# Patient Record
Sex: Male | Born: 1970 | Hispanic: Yes | Marital: Single | Smoking: Former smoker
Health system: Western US, Academic
[De-identification: ages and names within clinical notes are randomized; demographics above are authoritative.]

## PROBLEM LIST (undated history)

## (undated) DIAGNOSIS — K219 Gastro-esophageal reflux disease without esophagitis: Secondary | ICD-10-CM

## (undated) DIAGNOSIS — G35 Multiple sclerosis: Secondary | ICD-10-CM

## (undated) DIAGNOSIS — I10 Essential (primary) hypertension: Secondary | ICD-10-CM

## (undated) DIAGNOSIS — Z7409 Other reduced mobility: Secondary | ICD-10-CM

## (undated) HISTORY — PX: EXPLORATORY LAPAROTOMY: AMBSHX0061

## (undated) HISTORY — DX: Essential (primary) hypertension: I10

## (undated) HISTORY — DX: Multiple sclerosis: G35

## (undated) HISTORY — DX: Gastro-esophageal reflux disease without esophagitis: K21.9

## (undated) HISTORY — DX: Other reduced mobility: Z74.09

## (undated) MED FILL — riTUXimab-pvvr 10 mg/mL intravenous solution: INTRAVENOUS | Qty: 100 | Status: AC

## (undated) NOTE — Telephone Encounter (Signed)
Formatting of this note might be different from the original.  Received call on MS nursing line from:    [place]    RE: Pt needs PA for his Ampyra    Noted pt was last seen on 07/08/19.    Routed to Dr. Blenda Peals and pcc team for assistance.   Electronically signed by Burnadette Pop, RN at 02/07/2022 10:48 AM PDT

---

## 2013-04-16 ENCOUNTER — Ambulatory Visit: Admit: 2013-04-16 | Discharge: 2013-04-16 | Disposition: A | Discharge status: Home / Self Care | Payer: Self-pay

## 2013-09-09 ENCOUNTER — Ambulatory Visit: Admit: 2013-09-09 | Discharge: 2013-09-09 | Disposition: A | Discharge status: Home / Self Care | Payer: Self-pay

## 2013-09-16 ENCOUNTER — Ambulatory Visit: Admit: 2013-09-16 | Discharge: 2013-09-16 | Disposition: A | Discharge status: Home / Self Care | Payer: Self-pay

## 2013-12-02 MED ADMIN — incobotulinumtoxinA (XEOMIN) injection 300 Units: 300 [IU] | INTRAMUSCULAR | @ 20:00:00 | NDC 00259161001

## 2015-07-15 ENCOUNTER — Ambulatory Visit: Admit: 2015-07-15 | Discharge: 2015-07-15 | Disposition: A | Discharge status: Home / Self Care | Payer: Self-pay

## 2015-08-14 ENCOUNTER — Ambulatory Visit: Admit: 2015-08-14 | Discharge: 2015-08-14 | Disposition: A | Discharge status: Home / Self Care | Payer: Self-pay

## 2016-07-13 ENCOUNTER — Ambulatory Visit: Admit: 2016-07-13 | Discharge: 2016-07-13 | Disposition: A | Discharge status: Home / Self Care | Payer: Self-pay

## 2016-09-08 NOTE — Telephone Encounter (Signed)
Faxed copy of patient's insurance information to [facility] as requested by Herbert Seta for Ampyra PA. -SD    [facility]  P: [phone] [id number]  F: [phone]

## 2016-09-12 NOTE — Progress Notes (Signed)
Patient has clonus, marked spasticity, and extensor spasms in R leg. Also spasms in Lcalf.     PROCEDURE: Incobotulinum (Botulinum toxin)  A injection. The risks and benefits were discussed with patient and informed consent was obtained. Incobotulinumtoxin A was diluted at 2:1 in a sterile fashion with 0.9% preservative-free normal saline.     After consulting with Dr Joeseph Amor the following muscle groups were treated:     1. R gastrocnemius: 5 injections 25untis each for a total of 125 units.   2. R vastus medialis, rectus femoris: 8 injections x 25 units/each= 200 units   3. L gastrocnemius 3 inj x 25 units = 75 units     Summated, this patient received a total of 400 units of incobotulinumtoxin. There were no complications and no unavoidable waste.

## 2016-09-27 MED ADMIN — riTUXimab (RITUXAN) 500 mg in sodium chloride 0.9 % 500 mL infusion: INTRAVENOUS | @ 17:00:00 | NDC 50242005306

## 2016-09-27 MED ADMIN — diphenhydrAMINE (BENADRYL) injection 50 mg: 50 mg | INTRAVENOUS | @ 16:00:00 | NDC 00641037621

## 2016-09-27 MED ADMIN — methylPREDNISolone sodium succinate (PF) (solu-MEDROL) 125 mg/2 mL injection 125 mg: INTRAVENOUS | @ 16:00:00 | NDC 00009004725

## 2016-09-27 MED ADMIN — acetaminophen (TYLENOL) tablet 650 mg: 650 mg | ORAL | @ 16:00:00 | NDC 50580060002

## 2016-09-27 NOTE — Nursing Note (Signed)
Pt reports to clinic for #1Rituxan infusion. Reports 4/10 chronic pain,  Per pt this is his tolerable level (took his own home meds). C/o ongoing muscle aches, fatigue, and BLE weakness/numbness, as well as RUE weakness/numbeness, all syptoms r/t to MS. Overall pt feels as though his disease is stable at this time.     PIV placed, pre-mediacted as ordered. Infusion titrated @ 68ml/hr, vitals taken per protocol. Tolerated infusion well, no s/s of a reaction, vitals remained stable. Discharged in stable, ambulatory, condition accompanied by friend.     250 ml NS bag used for med flush before, during, and after treatment. Start 0905/Stop: 1310.

## 2016-10-11 MED ADMIN — diphenhydrAMINE (BENADRYL) injection 50 mg: 50 mg | INTRAVENOUS | @ 18:00:00 | NDC 00641037621

## 2016-10-11 MED ADMIN — acetaminophen (TYLENOL) tablet 650 mg: 650 mg | ORAL | @ 18:00:00 | NDC 00904198261

## 2016-10-11 MED ADMIN — riTUXimab (RITUXAN) 500 mg in sodium chloride 500 mL infusion: 500 mg | INTRAVENOUS | @ 19:00:00 | NDC 50242005306

## 2016-10-11 MED ADMIN — methylPREDNISolone sodium succinate (PF) (solu-MEDROL) 125 mg/2 mL injection 125 mg: INTRAVENOUS | @ 18:00:00 | NDC 00009004725

## 2016-10-11 NOTE — Nursing Note (Addendum)
Here for Rituxan infusion, #2. Arrived via wheelchair, accompanied by father. Denies recent fever or infection. Pt verbalized improved right extremity function/ movement since Rituxan was started 3 weeks ago. Able to move hands and leg for the first time in 4 years.    C/o B leg pain 2/10. Took Ibuprofen PTA. Able to transfer from wheelchair to treatment chair with minimal/standby assist.   PIV started on left arm, brisk blood return noted. Pre-meds given as ordered. Started infusion at 100 ml/hr, increased rate by 100 ml/h q 30 min up to max rate of 400 ml/hr. VS monitored with rate changes. No adverse reaction noted throughout the infusion.   Infusion ended, pt tolerated Rituxan.  IV d/c'd.   At 13:45, pt was transferring himself from treatment chair to his wheelchair. RN present during this time. Wheelchair locked and positioned per pt preference in preparation for transfer. While the pt was moving himself to his wheelchair, he landed in the gap/space between the treatment chair and his wheelchair. His weight pushed the wheelchair away. He was assisted to the floor by nursing staff. No injury sustained. He was then assisted back to his wheelchair by staff. Pt and family comfortable to go home. MD notified by NP.  Discharged to home. Advised pt to call the unit should they need assistance in getting into the car.   Instructed pt to call provider for any new or worsening symptoms, such as fever.     .COMPLETE THE DAY CHECKLIST:   The checklist below has been reviewed and implemented:   1. Beacon Treatment Plan, Therapy Plan, Signed and Held Orders   2. Growth Factors Administered no  3. Medication start and end times completed   4. Follow-up appointment scheduled   5. Comments: n/a   After checklist completed, go to the Oncology Navigator, click on Treatment Plan and click COMPLETE DAY.

## 2016-10-12 NOTE — Telephone Encounter (Signed)
Left message for pt to follow up on fall in clinic yesterday.  Gave pt our phone number and asked him to call with any problems or for assistance in contacting his provider if needed.

## 2016-10-12 NOTE — Telephone Encounter (Signed)
Pt experienced fall after rituxan administration on 10/11/16.   Pt returned call previously noted.  States he is feeling very well today with no problems/complaints related to treatment or fall.  Spoke to [employer], Charity fundraiser.

## 2016-11-17 NOTE — Telephone Encounter (Signed)
Sent PA for Ampyra via CoverMyMeds. -SD

## 2016-12-20 NOTE — Progress Notes (Signed)
Patient has clonus, marked spasticity, and extensorspasms in R leg. Also spasms in Lcalf.  Last injection worked well.    PROCEDURE: Incobotulinum (Botulinum toxin) A injection. The risks and benefits were discussed with patient and informed consent was obtained. Incobotulinumtoxin A was diluted at 2:1 in a sterile fashion with 0.9% preservative-free normal saline.     After consulting with Dr Joeseph Amor the following muscle groups were treated:    1. R gastrocnemius: 5 injections 25untis each for a total of 125units.   2. R vastus medialis, rectus femoris: 8 injections x 25 units/each= 200 units   3. L gastrocnemius 3 inj x 25 units = 75units     Summated, this patient received a total of 400units of incobotulinumtoxin. There were no complications and no unavoidable waste.

## 2016-12-20 NOTE — Progress Notes (Signed)
F/U MS     Meds:   Rituximab (last infusion 3 months ago) - We will monitor B-cells                           Ampyra 10 mg bid                           Ibuprofen 800 mg prn  (HA and other pains)                           Norco 5/325 prn (pain)  -  #60 per month - was being filled by PMD                           Vit D    5000 IU / d     Baclofen 20 mg tid  (spasms)                           Biotin  10 mg per day    10 Pt ROS () ;  Bladder:  OK  ;  Bowel:   OK    Brain MRI: Not Done     Labs: 03/28/16:Pre-Rituxan labs Ok     c/o: Pain in R shoulder and R knee ( was being managed with Norco but PMD was fired and he doesn't have person to write script)     Botox works well      No Episodes        No Exam:      Assess: MS - stable    Plan:  Long discussion regarding Rx - will monitor B-cells    I will write 1 script for Norco but then patient to find another PMD    RV 6 months      I spent a total of  25 minutes with this patient, 15 of which was spent in  discussing with the patient the various treatment options, the disease course, and the patient's prognosis.

## 2017-02-09 NOTE — Telephone Encounter (Signed)
Returned patient's phone call and left message informing patient that his next appointment with Dr. Joeseph Amor is on 03/21/17 at 11AM.    Encouraged patient to call back if he has any further questions.

## 2017-02-10 NOTE — Telephone Encounter (Signed)
Patient called and would like to know when he is due for his next Rituxan infusion. Patient was last infused 09/27/16 and 10/11/16. Informed patient that since Dr. Joeseph Amor is treating off of B-cells, patient should check labs at the 82-month mark (in September 2018). Patient verbalized understanding. He will check labs at his next visit with Dr. Joeseph Amor on 03/21/17.

## 2017-03-21 ENCOUNTER — Ambulatory Visit: Admit: 2017-03-21 | Discharge: 2017-03-21 | Payer: MEDICARE | Attending: Neurology

## 2017-03-21 DIAGNOSIS — M62838 Other muscle spasm: Secondary | ICD-10-CM

## 2017-03-21 DIAGNOSIS — G35 Multiple sclerosis: Secondary | ICD-10-CM

## 2017-03-21 NOTE — Addendum Note (Signed)
Addended by: Gae Dry on: 03/21/2017 02:15 PM     Modules accepted: Orders, SmartSet

## 2017-03-21 NOTE — Progress Notes (Signed)
Patient has clonus, marked spasticity, and extensorspasms in R leg. Also spasms in Lcalf.  Last injection worked well but wore off after 2.5 months.    PROCEDURE: Incobotulinum (Botulinum toxin) A injection. The risks and benefits were discussed with patient and informed consent was obtained. Incobotulinumtoxin A was diluted at 2:1 in a sterile fashion with 0.9% preservative-free normal saline.     After consulting with Dr Joeseph Amor the following muscle groups were treated:    1. R gastrocnemius: 5 injections 25untis each for a total of 125units.   2. R vastus medialis, rectus femoris: 8 injections x 25 units/each= 200 units   3. L gastrocnemius 3 inj x 25 units = 75units     Summated, this patient received a total of 400units of incobotulinumtoxin. There were no complications and no unavoidable waste.

## 2017-03-23 NOTE — Telephone Encounter (Signed)
Faxed PA form for Rituxan to [facility]. -SD

## 2017-03-28 NOTE — Telephone Encounter (Signed)
Sent Rituxan pre-lab orders to patient via email and snail mail. -SD

## 2017-06-17 ENCOUNTER — Encounter: Admit: 2017-06-17 | Discharge: 2017-06-19 | Payer: MEDICARE | Attending: Neurology

## 2017-06-17 DIAGNOSIS — G35 Multiple sclerosis: Secondary | ICD-10-CM

## 2017-06-17 MED ORDER — ACETAMINOPHEN 325 MG TABLET
325 | Freq: Once | ORAL | Status: AC
Start: 2017-06-17 — End: 2017-06-17
  Administered 2017-06-17: 19:00:00 via ORAL

## 2017-06-17 MED ORDER — EPINEPHRINE 0.3 MG/0.3 ML INJECTION, AUTO-INJECTOR
0.3 | Freq: Once | INTRAMUSCULAR | Status: DC | PRN
Start: 2017-06-17 — End: 2017-06-19

## 2017-06-17 MED ORDER — HYDROCORTISONE SOD SUCCINATE (PF) 100 MG/2 ML SOLUTION FOR INJECTION
100 | Freq: Once | INTRAMUSCULAR | Status: DC | PRN
Start: 2017-06-17 — End: 2017-06-19

## 2017-06-17 MED ORDER — DIPHENHYDRAMINE 50 MG/ML INJECTION SOLUTION
50 | Freq: Once | INTRAMUSCULAR | Status: DC | PRN
Start: 2017-06-17 — End: 2017-06-19

## 2017-06-17 MED ORDER — DIPHENHYDRAMINE 50 MG/ML INJECTION SOLUTION
50 | Freq: Once | INTRAMUSCULAR | Status: AC
Start: 2017-06-17 — End: 2017-06-17
  Administered 2017-06-17: 19:00:00 via INTRAVENOUS

## 2017-06-17 MED ORDER — ALBUTEROL SULFATE HFA 90 MCG/ACTUATION AEROSOL INHALER
90 | Freq: Once | RESPIRATORY_TRACT | Status: DC | PRN
Start: 2017-06-17 — End: 2017-06-19

## 2017-06-17 MED ORDER — METHYLPREDNISOLONE SOD SUCC (PF) 125 MG/2 ML SOLUTION FOR INJECTION
125 | Freq: Once | INTRAMUSCULAR | Status: AC
Start: 2017-06-17 — End: 2017-06-17
  Administered 2017-06-17: 19:00:00 via INTRAVENOUS

## 2017-06-17 MED ORDER — RITUXIMAB 10 MG/ML CONCENTRATE,INTRAVENOUS
10 | INTRAVENOUS | Status: DC
Start: 2017-06-17 — End: 2017-06-19
  Administered 2017-06-17: 20:00:00 via INTRAVENOUS

## 2017-06-17 NOTE — Nursing Note (Addendum)
Alex Mann, [place] with hx of MS is here for Rituximab. Arrived here in the wheelchair accompanied by his caregiver. He reports he has more weakness in bilateral legs (R>L). Had a fall at home a month ago, landed on his back. No injury reported. Has chronic pain in bilateral knees and thighs rated at 5/10, uses ibuprofen 2-3 times/day with some relief. Endorses more spasticities 2-3/days. Elevated BP noted. Pt reports that he sopped taking HTN meds due to side effects.     PIV inserted to left arm with brisk blood return noted. Premedications (solumedrol, benadryl IV and tylenol PO) given followed by rituximab per order.     # fall: pt slid off the chair due to spasticity. RN found him on the floor on his stomach. Rituxan stopped. No injuries noted. BP 155/99, P 90. Denies headache, dizziness or confusion. Rituxan re-started at the rate at 400 mL/hr and completed without any complications. Stable V.S.     Discharged to the home accompanied by his caregiver in the wheelchair. The fall reported to Dr Joeseph Amor via email.

## 2017-06-18 NOTE — Telephone Encounter (Signed)
Follow-up call placed to patient. Pt fell out of chair yesterday while receiving Rituxan. Reports he had leg spacticity and fell asleep after Benadryl. Able to get off floor with assistance and completed medication.   Denies any new issues today. Pt is at home resting and appreciated call.

## 2017-07-13 ENCOUNTER — Ambulatory Visit: Admit: 2017-07-13 | Discharge: 2017-07-13 | Payer: MEDICARE | Attending: Neurology

## 2017-07-13 DIAGNOSIS — G35 Multiple sclerosis: Secondary | ICD-10-CM

## 2017-07-13 DIAGNOSIS — R252 Cramp and spasm: Secondary | ICD-10-CM

## 2017-07-13 MED ORDER — INCOBOTULINUMTOXINA 100 UNIT INTRAMUSCULAR SOLUTION
100 | Freq: Once | INTRAMUSCULAR | Status: AC
Start: 2017-07-13 — End: 2017-07-13
  Administered 2017-07-13: 22:00:00 via INTRAMUSCULAR

## 2017-07-13 MED ORDER — DALFAMPRIDINE ER 10 MG TABLET,EXTENDED RELEASE,12 HR
10 | ORAL | 1 refills | Status: DC
Start: 2017-07-13 — End: 2017-11-27

## 2017-07-13 NOTE — Progress Notes (Addendum)
Patient with MS and spasticity    Last injection worked well but wore off after 2.5 months.    PROCEDURE: Incobotulinum (Botulinum toxin) A injection. The risks and benefits were discussed with patient and informed consent was obtained. Incobotulinumtoxin A was diluted at 2:1 in a sterile fashion with 0.9% preservative-free normal saline.     After consulting with Dr Joeseph Amor the following muscle groups were treated:    1. R gastrocnemius: 5 injections 25untis each for a total of 125units.   2. R vastus medialis, rectus femoris: 8 injections x 25 units/each= 200 units   3. L gastrocnemius 3 inj x 25 units = 75units     Summated, this patient received a total of 400units of incobotulinumtoxin. There were no complications and no unavoidable waste.

## 2017-08-28 MED ORDER — DALFAMPRIDINE ER 10 MG TABLET,EXTENDED RELEASE,12 HR
10 | ORAL | 3 refills | Status: DC
Start: 2017-08-28 — End: 2017-11-27

## 2017-09-19 ENCOUNTER — Ambulatory Visit: Admit: 2017-09-19 | Discharge: 2017-09-19 | Payer: MEDICARE | Attending: Neurology

## 2017-09-19 DIAGNOSIS — R252 Cramp and spasm: Secondary | ICD-10-CM

## 2017-09-19 DIAGNOSIS — G35 Multiple sclerosis: Secondary | ICD-10-CM

## 2017-09-19 DIAGNOSIS — M62831 Muscle spasm of calf: Secondary | ICD-10-CM

## 2017-09-19 DIAGNOSIS — G9589 Other specified diseases of spinal cord: Secondary | ICD-10-CM

## 2017-09-19 DIAGNOSIS — M62838 Other muscle spasm: Secondary | ICD-10-CM

## 2017-09-19 DIAGNOSIS — M6248 Contracture of muscle, other site: Secondary | ICD-10-CM

## 2017-09-19 MED ORDER — INCOBOTULINUMTOXINA 100 UNIT INTRAMUSCULAR SOLUTION
100 | Freq: Once | INTRAMUSCULAR | Status: AC
Start: 2017-09-19 — End: 2017-09-19
  Administered 2017-09-19: 23:00:00 via INTRAMUSCULAR

## 2017-09-19 NOTE — Progress Notes (Signed)
Last injection worked well but wore off after 2.5 months.    PROCEDURE: Incobotulinum (Botulinum toxin) A injection. The risks and benefits were discussed with patient and informed consent was obtained. Incobotulinumtoxin A was diluted at 2:1 in a sterile fashion with 0.9% preservative-free normal saline.  Utilized universal procedure for time-out. Utilized universal procedure for time-out       After consulting with Dr Joeseph Amor the following muscle groups were treated:    1. R gastrocnemius: 5 injections 25untis each for a total of 125units.   2. R vastus medialis, rectus femoris: 8 injections x 25 units/each= 200 units   3. L gastrocnemius 3 inj x 25 units = 75units     Summated, this patient received a total of 400units of incobotulinumtoxin. There were no complications and no unavoidable waste.

## 2017-09-19 NOTE — Progress Notes (Signed)
F/U MS     Meds:    Rituximab (last infusion December 2018) - We will monitor B-cells              Ampyra 10 mg bid              Ibuprofen 800 mg prn (HA and other pains)              Norco 5/325 prn (pain)  -  #60 per month - was being filled by PMD              Vit D5000 IU / d                                      Baclofen 20 mg tid  (spasms)              Biotin 10 mg per day    10 Pt ROS () ;  Bladder:  OK  ;  Bowel:   OK    Brain MRI: Not Done (last 12/17)    Labs: Pre-Rituximab labs Ok     c/o: Doing Ok     Still with pain in R knee and now R hip after falling on R hip 2 months ago.     Now able to shake my hand with his R hand ; can now move his R leg (improvement)     Can't walk ; does assist with transfers can stand     No episodes      Exam:    MS:  A+O x3  ;  Cog Fx:    nl   CrN II-XII: VA: 20/10  OD ;  20/10  OS           () INO ;   () MG ; () Nyst    H / FS / FM / T / Palate / SCM : nl     Motor:    Tone:     UE  nl ; LE:  nl     D T B WE FA IP Q ADD ABD H TA   R 4 4  4  0* 0  0 0  0   L 5+ 5+  5+ 5+ 4  4+ 4  3     * can't extend fingers on R    Sens:  LT: nl   Cbl:  FTN: Ok L; unable R ; HKS: Unable bilat ; RAM: Ok L ;  R  c/w weakness    Gait:  Unable    DTRs  UE: tr(=)  LE: tr (=)       Assess: MS - stable    Plan:  Continue present Rx    RV 6 months      I spent a total of  40 minutes with this patient, 25 of which was spent in  discussing with the patient the various treatment options, the disease course, and the patient's prognosis.

## 2017-10-19 NOTE — Telephone Encounter (Signed)
Sent Rituxan pre-lab orders to patient via email and snail mail. -SD

## 2017-10-30 NOTE — Telephone Encounter (Signed)
Called and left a voice message to patient following up his Rituxan pre-labs. -SD

## 2017-11-16 NOTE — Telephone Encounter (Signed)
Mailed pre-test lab order for retreat Rituxan to patient.

## 2017-11-27 MED ORDER — DALFAMPRIDINE ER 10 MG TABLET,EXTENDED RELEASE,12 HR
10 | ORAL_TABLET | ORAL | 3 refills | 90.00000 days | Status: DC
Start: 2017-11-27 — End: 2017-12-01

## 2017-12-01 MED ORDER — DALFAMPRIDINE ER 10 MG TABLET,EXTENDED RELEASE,12 HR: 10 mg | ORAL | 3 refills | Status: DC

## 2017-12-01 NOTE — Telephone Encounter (Signed)
Left message to patient to confirm if he did his lab work or if he wants [place] to re send the lab orders.

## 2017-12-01 NOTE — Progress Notes (Signed)
Rerouted to contracted specialty pharmacy

## 2017-12-01 NOTE — Telephone Encounter (Signed)
Voicemail: Calling to check the status of his bloodwork and Rituxan. Routed to [place].

## 2017-12-07 NOTE — Telephone Encounter (Signed)
Called pt to notify that Dr. Joeseph Amor recommended to repeat pt's blood work in a month from last blood work done d/t low B cells. Pt verbalized understanding and agreed. Notified pt his last labs were done on  11/13/17. Will notify Hale Bogus, Nurse, adult.

## 2017-12-22 NOTE — Telephone Encounter (Signed)
Voicemail: Patient would like to know the status of the bloodwork and scheduling of his Rituxan. Routed to [place].

## 2017-12-25 NOTE — Telephone Encounter (Signed)
Patient left message following up regarding his labwork. Called and left message to send [place] via fax the most recent lab results.

## 2017-12-29 NOTE — Telephone Encounter (Signed)
LVM regarding labs that needed to get re done. And asked for a call back

## 2017-12-29 NOTE — Telephone Encounter (Signed)
Patient called back stating that labs were sent out to his PCP and will follow up with the lab to have results faxed to the office.

## 2018-01-08 NOTE — Telephone Encounter (Signed)
Called pt to follow up and gather more information. Unable to get hold. LVM requesting to contact back.

## 2018-01-08 NOTE — Telephone Encounter (Signed)
LVM to have pt call the nurse since he is having symptoms.

## 2018-01-15 NOTE — Telephone Encounter (Signed)
Faxed urgent PA for Rituxan to [employer] [facility] [place].

## 2018-01-15 NOTE — Telephone Encounter (Signed)
Contacted [facility] to obtain Lymphocytes subsets and CBC lab results.  Per [facility], they will obtain authorization from [facility] to provide lab results to [place].

## 2018-02-16 ENCOUNTER — Encounter: Admit: 2018-02-16 | Discharge: 2018-02-18 | Payer: MEDICARE | Attending: Neurology

## 2018-02-16 DIAGNOSIS — G35 Multiple sclerosis: Secondary | ICD-10-CM

## 2018-02-16 MED ORDER — ACETAMINOPHEN 325 MG TABLET
325 | Freq: Once | ORAL | Status: AC
Start: 2018-02-16 — End: 2018-02-16
  Administered 2018-02-16: 17:00:00 via ORAL

## 2018-02-16 MED ORDER — METHYLPREDNISOLONE SOD SUCC (PF) 125 MG/2 ML SOLUTION FOR INJECTION
125 | Freq: Once | INTRAMUSCULAR | Status: AC
Start: 2018-02-16 — End: 2018-02-16
  Administered 2018-02-16: 18:00:00 via INTRAVENOUS

## 2018-02-16 MED ORDER — DIPHENHYDRAMINE 50 MG/ML INJECTION SOLUTION
50 | Freq: Once | INTRAMUSCULAR | Status: AC
Start: 2018-02-16 — End: 2018-02-16
  Administered 2018-02-16: 17:00:00 via INTRAVENOUS

## 2018-02-16 MED ORDER — HYDROCORTISONE SOD SUCCINATE (PF) 100 MG/2 ML SOLUTION FOR INJECTION
100 | Freq: Once | INTRAMUSCULAR | Status: DC | PRN
Start: 2018-02-16 — End: 2018-02-18

## 2018-02-16 MED ORDER — ALBUTEROL SULFATE HFA 90 MCG/ACTUATION AEROSOL INHALER
90 | Freq: Once | RESPIRATORY_TRACT | Status: DC | PRN
Start: 2018-02-16 — End: 2018-02-18

## 2018-02-16 MED ORDER — DIPHENHYDRAMINE 50 MG/ML INJECTION SOLUTION
50 | Freq: Once | INTRAMUSCULAR | Status: DC | PRN
Start: 2018-02-16 — End: 2018-02-18

## 2018-02-16 MED ORDER — EPINEPHRINE 0.3 MG/0.3 ML INJECTION, AUTO-INJECTOR
0.3 | Freq: Once | INTRAMUSCULAR | Status: DC | PRN
Start: 2018-02-16 — End: 2018-02-18

## 2018-02-16 MED ORDER — SODIUM CHLORIDE 0.9 % INTRAVENOUS SOLUTION
0.9 | INTRAVENOUS | Status: DC
Start: 2018-02-16 — End: 2018-02-18
  Administered 2018-02-16: 18:00:00 1000 mg via INTRAVENOUS

## 2018-02-16 NOTE — Nursing Note (Signed)
Pt presents for rituxan.  Pt is falls risk (hx of slipping out of infusion chairs) and requires bed.  He is non weight bearing and require [employer] lift equipment.  Notes pain to bilateral knees, takes ibuprofen with positive effect.  He also notes multiple spasms to extremities, states is at his baseline.  No further c/o s/s.  Pre meds given.  Due to spasm, PIV accidentally removed by patient.  PIV restarted without issue and medication resumed.  Rituxan titrated and VS monitored per orders, pt tolerated without issue.  Discharged in stable condition without incident.    .COMPLETE THE DAY CHECKLIST:   The checklist below has been reviewed and implemented:   1. Beacon Treatment Plan, Therapy Plan, Signed and Held Orders   2. Growth Factors Administered no  3. Medication start and end times completed   4. Follow-up appointment scheduled no, due in 6 months, pt will call to schedule  5. Comments: n/a  After checklist completed, go to the Oncology Navigator, click on Treatment Plan and click COMPLETE DAY.

## 2018-04-26 ENCOUNTER — Ambulatory Visit: Admit: 2018-04-26 | Discharge: 2018-04-26 | Payer: MEDICARE | Attending: Neurology

## 2018-04-26 DIAGNOSIS — G35 Multiple sclerosis: Secondary | ICD-10-CM

## 2018-04-26 DIAGNOSIS — M62838 Other muscle spasm: Secondary | ICD-10-CM

## 2018-04-26 DIAGNOSIS — R52 Pain, unspecified: Secondary | ICD-10-CM

## 2018-04-26 DIAGNOSIS — M6249 Contracture of muscle, multiple sites: Secondary | ICD-10-CM

## 2018-04-26 DIAGNOSIS — R3915 Urgency of urination: Secondary | ICD-10-CM

## 2018-04-26 DIAGNOSIS — R252 Cramp and spasm: Secondary | ICD-10-CM

## 2018-04-26 DIAGNOSIS — R152 Fecal urgency: Secondary | ICD-10-CM

## 2018-04-26 MED ORDER — OXYBUTYNIN CHLORIDE ER 5 MG TABLET,EXTENDED RELEASE 24 HR
5 | ORAL_TABLET | Freq: Every day | ORAL | 3 refills | Status: AC
Start: 2018-04-26 — End: ?

## 2018-04-26 MED ORDER — INCOBOTULINUMTOXINA 100 UNIT INTRAMUSCULAR SOLUTION
100 | Freq: Once | INTRAMUSCULAR | Status: AC
Start: 2018-04-26 — End: 2018-04-26
  Administered 2018-04-26: 20:00:00 via INTRAMUSCULAR

## 2018-04-26 NOTE — Addendum Note (Signed)
Addended by: Gae Dry on: 06/28/2018 12:43 PM     Modules accepted: Orders

## 2018-04-26 NOTE — Progress Notes (Addendum)
Patient with MS and spasticity    Last injection worked well but wore off after 2.5 months. But now with severe spasticity has developed in both LE including adductors    PROCEDURE: Incobotulinum (Botulinum toxin) A injection. The risks and benefits were discussed with patient and informed consent was obtained. Incobotulinumtoxin A was diluted at 2:1 in a sterile fashion with 0.9% preservative-free normal saline.  Utilized universal procedure for time-out. Utilized universal procedure for time-out       After consulting with Dr Joeseph Amor the following muscle groups were treated:    1. R gastrocnemius: 2 injections 25untis each for a total of 50units.   2. L  vastus medialis, rectus femoris: 8 injections x 25 units/each= 150 units   3. R vastus medialis, rectus femoris: 8 injections x 25 units/each= 150 units   4. L gastrocnemius 2 inj x 25 units = 50units   5. R adductor magnus 4 injections 25 units for a total =100 units  6. L adductor magnus 4 injections 25 units for a total =100 units      Summated, this patient received a total of 600units of incobotulinumtoxin. There were no complications and no unavoidable waste.

## 2018-04-26 NOTE — Progress Notes (Signed)
F/U MS     Meds:    Rituximab (last infusion August 2019) - q 6 months  Ampyra 10 mg bid  Ibuprofen 800 mg prn (HA and other pains)   Vit D5000 IU / d  Baclofen 20 mg tid (spasms)  Biotin 10 mg per day (not tal=king --     10 Pt ROS () ;  Bladder:   +urgency; daily incont (new)        Bowel:    +urgency    Brain MRI: Not Done (last Dec 2017)    Labs: Not done;  Apparently UA showed no infection     c/o: Fell in July when transferring; attendant wouldn't come to his assistance and catch patient.   Fell on but but felt a "snap" in R knee and since then he has had severe pain in R hip and knee   Also has been unable to straighten either leg (R>L) but no pain on L.      Hip pain has been present for ~2 years; getting worse; much worse after fall     Recently had an MRI (no MRI report);  Hip MRI needs  to be scheduled     No episodes     No walking      Exam:    MS:  A+O x3  ;  Cog Fx:    nl   CrN II-XII: VA: 20/10  OD ;  20/10  OS           () INO ;   () MG ; () Nyst    H / FS / FM / T / Palate / SCM : nl     Motor:    Tone:     UE  nl on L; sever spasticity on R;         LE: severe spasticity bilat          (I can't move legs apart or extend knees.)    D T B WE FA IP Q ADD ABD H TA   R 4- 5+  5- 3 0  * * * *   L 5+ 5+  5+ 5+ 2-3  * * * *     * = marked spasticity interferes with testing    Sens:  LT: nl     Cbl:  FTN: nl  ; HKS: nl  ; RAM: nl     Gait:  Base: nl ; H / T / T:     nl  ;        () [place] ; Pos: nl       DTRs  UE: 1+ (L);   tr (R LE: tr (=)       Assess: Spasticity much worse as is urinary function --  Checked for UTI but neg    Plan:  Will change to q 6 months Rituximab    Will need to get MRI report and determine what needs to be done    Trial Ditropan    MRI of R hip      I spent a total of  40 minutes with this patient, 25  of which was spent in  discussing with the patient the various treatment options, the disease course, and the patient's prognosis.

## 2018-05-22 NOTE — Telephone Encounter (Signed)
Patient's father would like to speak with Dr. Joeseph Amor regarding the patient's medical condition. Told the father that we need ROI sign by the patient to release information to him. Patient's father stated that he can't provide that. Educated him the importance of [employer] compliance. Requested to callback if he wants to obtain ROI.

## 2018-06-28 MED ORDER — INCOBOTULINUMTOXINA 100 UNIT INTRAMUSCULAR SOLUTION
100 | Freq: Once | INTRAMUSCULAR | Status: AC
Start: 2018-06-28 — End: ?

## 2018-07-20 ENCOUNTER — Ambulatory Visit: Admit: 2018-07-20 | Discharge: 2018-07-20 | Payer: MEDICARE | Attending: Neurology

## 2018-07-20 DIAGNOSIS — G35 Multiple sclerosis: Secondary | ICD-10-CM

## 2018-07-20 NOTE — Telephone Encounter (Signed)
Called pt to f/u and obtain more information. Pt c/o unable to ambulate himself in and out of bed or use the bathroom independently. Reported he took prednisone prescribed by Dr. Tomasa Blase from [facility]. Pt reported UTI was r/o and pt was put on ABT. Reported completing his ABT for UTI a week ago. Pt verbalized concern and stated, "this is not my baseline, I was able to get in and out of bed myself before". Called Dr. Joeseph Amor.     Dr. Joeseph Amor recommended to schedule a telephone for pt. Pt aware and requested Hale Bogus to assist with scheduling.

## 2018-07-20 NOTE — Progress Notes (Signed)
Telephone Call      Reason for the call: Having considerably more difficulty wiuth ADL in the setting of a UTI. Finished course of antibiotics but  Not yet improved.      Recommendation: Will Rx with another course. UA pending

## 2018-07-20 NOTE — Telephone Encounter (Signed)
Patient left message stating that he is having exacerbation.     Routed to [place] to assist.

## 2018-08-01 NOTE — ED Notes (Signed)
Attempted conf call w/ Dr. Nelta Numbers (NR) and OSH ED MD but OSH TC coordinator Brett Canales states pt d/t home. Transfer request cancelled.

## 2018-08-28 NOTE — Telephone Encounter (Signed)
2/11 mailed labs to patient  Referral created

## 2018-09-14 NOTE — Telephone Encounter (Signed)
2/28 Labs done.    Labs and yellow form in Dr. Vaughan Browner folder to review and sign.

## 2018-09-24 NOTE — Telephone Encounter (Signed)
3/9 Called patient to schedule his rituxan infusion

## 2018-11-15 MED ORDER — DALFAMPRIDINE ER 10 MG TABLET,EXTENDED RELEASE,12 HR
10 | ORAL | 3 refills | 90.00000 days | Status: AC
Start: 2018-11-15 — End: 2019-11-07

## 2018-11-27 ENCOUNTER — Ambulatory Visit: Admit: 2018-11-27 | Discharge: 2018-11-27 | Payer: MEDICARE | Attending: Neurology

## 2018-11-27 DIAGNOSIS — G35 Multiple sclerosis: Secondary | ICD-10-CM

## 2018-11-27 NOTE — Progress Notes (Addendum)
F/U MS    I performed this consultation using real-time Telehealth tools, including a live video connection between my location and the patient's location. Prior to initiating the consultation, I obtained informed verbal consent to perform this consultation using Telehealth tools and answered all the questions about the Telehealth interaction.       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

## 2019-01-04 MED ORDER — RITUXIMAB 10 MG/ML CONCENTRATE,INTRAVENOUS
10 mg/mL | INTRAVENOUS | Status: AC
Start: 2019-01-04 — End: ?

## 2019-06-20 NOTE — Telephone Encounter (Signed)
Pt called and c/o unable to walk. Pt reported his knees gets locked together 90 degree and is unable to unlock. Per pt, he took Baclofen but failed and also tried low dose of Klonopin which was effective initially but no more. Pt reported his local physician is recommending ligament surgery but he would like to speak with Dr. Blenda Peals prior to proceeding. Pt verbalized concern and stated, "I am not borderline bedridden".     When questioned, reported it's ongoing for a year or two and is worsening now. Pt denied see a Physical Therapist currently. Notified Dr. Blenda Peals.     Dr. Blenda Peals recommended in-person visit if possible for evaluation and assesment. Notified pt MD's recommendation and notified I will request [facility] to assist. Pt agreed.

## 2019-06-21 NOTE — Telephone Encounter (Signed)
Received a voicemail from patient on Friday, June 21, 2019 12:00 PM, requesting to speak with sheila.    Routed to sheila to further assist.

## 2019-07-08 ENCOUNTER — Ambulatory Visit: Admit: 2019-07-08 | Payer: MEDICARE | Attending: Neurology | Primary: Physician

## 2019-07-08 DIAGNOSIS — G35 Multiple sclerosis: Secondary | ICD-10-CM

## 2019-07-08 MED ORDER — CLONAZEPAM 0.5 MG TABLET: 0.5 mg | ORAL | Status: AC | PRN

## 2019-07-08 MED ORDER — DIAZEPAM 5 MG TABLET
5 | ORAL_TABLET | ORAL | 0 refills | Status: DC
Start: 2019-07-08 — End: 2019-07-17

## 2019-07-08 NOTE — Progress Notes (Signed)
F/U MS     Meds:    Rituximab (last infusion August 2019) -q 6 months  Ampyra 10 mg bid  Ibuprofen 800 mg prn (HA and other pains)  Vit D5000 IU / d  Baclofen 20 mg tid (spasms)  Biotin 10 mg per day     Has script for ditropan but pt not taking -- told him no to at this time    10 Pt ROS () ; Bladder:+urgency; < 1 per week ncont (at night)-- much improved  Bowel:+urgency    Brain MRI: Not Done  (last Dec 2017)    Labs: Not done     c/o: Adductor and Knee flexor spasticity for the past year -- getting markedly worse recently.   Getting pressure sores   Baclofen doesn't help        Also has scoliosis which has been getting worse      Exam:  I am unable to move his LE which are locked in adduction, + hip/knee  Flexion + ankle dorsiflexion        Assess: Severe spasticity in LE;  Unclear whether there is a component of contracture    Plan:  Urgernt referral to Neurosurgery for Baclofen pump consideration    Script for Valium for ride back to [facility]        I spent a total of  minutes in face -to-face time with the patient and in non-face-to-face activities conducted today 07/08/19 directly related to this video visit, including reviewing records and tests, obtaining history and exam, placing orders, communicating with other healthcare professionals, counseling the patient, family or caregiver, documenting in the medical record, and/or care coordination for the diagnoses above.

## 2019-07-09 NOTE — Telephone Encounter (Signed)
Called and LVM for patient to call me back to schedule an appointment

## 2019-07-16 NOTE — Telephone Encounter (Signed)
Alex Mann, [facility] forward a voice message from pt requesting for prescription. VM was unclear. Called pt to f/u.    Pt reported his caregiver called earlier and he does not recall the meds that they were calling for. Pt did report he note valium being effective for his bilateral lower extremities when locked. Per pt, he was taking baclofen 80 mg PO per day for two years but not effective and reported he stopped taking it about a month ago. Also reported the baclofen pump MD recommended cannot be done until Feb, 2020. Pt questioned if she can receive Valium prescription to ease the symptom until baclofen pump has been provided. Will notify MD and will f/u. Pt aware.

## 2019-07-17 MED ORDER — DIAZEPAM 5 MG TABLET
5 | ORAL_TABLET | ORAL | 5 refills | Status: AC
Start: 2019-07-17 — End: ?

## 2019-07-17 NOTE — Telephone Encounter (Signed)
Dr. Blenda Peals approved pt's Valium prescription. Called pt to notify. Unable to get hold. LVM with MD's recommendation and requested to contact back if needed.

## 2019-08-15 NOTE — Telephone Encounter (Signed)
Called patient and left voicemail.

## 2019-08-19 NOTE — H&P (Deleted)
Thank you for referring Alex Mann to the [facility] for evaluation of baclofen pump consult.    I performed this consultation using real-time Telehealth tools, including a live video connection between my location and the patient's location. Prior to initiating the consultation, I obtained informed verbal consent to perform this consultation using Telehealth tools and answered all the questions about the Telehealth interaction.  My location {Location:[address]}.      {Failed Video Visit Note (optional):[phone]::" "}    HISTORY OF PRESENT ILLNESS:  Alex Mann is a 49 y.o. male with MS with severe lower extremity spasticity. He presents today for a baclofen pump consult.       Patient Active Problem List   Diagnosis    Multiple sclerosis (CMS code)       Past Medical History:   Diagnosis Date    Diplopia     Spasticity     Increasing       No past surgical history on file.    Allergies/Contraindications   Allergen Reactions    Carbamazepine Rash       Current Outpatient Medications   Medication Sig Dispense Refill    baclofen (LIORESAL) 20 mg tablet Take one tablet po TID for spasticity. (Patient not taking: Reported on 07/08/2019  ) 90 tablet 11    biotin 10,000 mcg CAP Take 1 capsule by mouth Daily.      cholecalciferol, vitamin D3, 10,000 unit CAP Take 6,000 Units by mouth Daily.       clonazePAM (KLONOPIN) 0.5 mg tablet Take 0.5 mg by mouth 2 (two) times daily as needed for Anxiety      dalfampridine 10 mg 12 hr tablet TAKE ONE TABLET BY MOUTH TWICE DAILY (APPROXIMATELY 12 HOURS APART). MAY BE TAKEN WITH OR WITHOUT FOOD. DO NOT CUT OR CRUSH. 180 tablet 3    diazePAM (VALIUM) 5 mg tablet Take 1 tablet PO BID PRN for lower ext spasm and anxiety 60 tablet 5    diptheria-tetanus-acellular pertussis, Tdap, (ADACEL) 2 Lf-(2.5-5-3-5 mcg)-5Lf/0.5 mL injection Inject 0.5 mLs into the muscle once. On 09/29/2014      DOCUSATE SODIUM (COLACE ORAL) Take by mouth.      flu vacc  ts 2013, 18-35yrs,-PF 27 mcg/0.1 mL SYRINGE Inject into the skin. On 09/29/2014      furosemide (LASIX) 20 mg tablet Take 20 mg by mouth Daily.      ibuprofen (ADVIL,MOTRIN) 800 mg tablet Take 1 tablet (800 mg total) by mouth 2 (two) times daily. 60 tablet 5    incobotulinumtoxinA (XEOMIN) 100 unit SOLR injection Inject 400 Units into the muscle once. 400 Units 0    magnesium 250 mg TAB Take 500 mg by mouth nightly at bedtime.       oxybutynin (DITROPAN XL) 5 mg 24 hr tablet Take 1 tablet (5 mg total) by mouth Daily. (Patient not taking: Reported on 07/08/2019  ) 30 tablet 3    riTUXimab (RITUXAN) 10 mg/mL injection Inject 1,000 mg into the vein every 6 (six) months      sildenafil (VIAGRA) 100 mg tablet TAKE 1 TABLET BY MOUTH 30 MINUTES PRIOR TO ACTIVITY 10 tablet 6     Current Facility-Administered Medications   Medication Dose Route Frequency Provider Last Rate Last Admin    incobotulinumtoxinA (XEOMIN) injection 600 Units  600 Units Intramuscular Once Douglas S. Goodin, MD           There are no discontinued medications.    Social History  Socioeconomic History    Marital status: Single     Spouse name: Not on file    Number of children: Not on file    Years of education: Not on file    Highest education level: Not on file   Occupational History    Not on file   Social Needs    Financial resource strain: Not on file    Food insecurity     Worry: Not on file     Inability: Not on file    Transportation needs     Medical: Not on file     Non-medical: Not on file   Tobacco Use    Smoking status: Former Smoker    Smokeless tobacco: Never Used    Tobacco comment: Non Smoker   Substance and Sexual Activity    Alcohol use: Yes     Comment: Occasional alcohol    Drug use: Yes     Types: Marijuana     Comment: occasional    Sexual activity: Not on file   Lifestyle    Physical activity     Days per week: Not on file     Minutes per session: Not on file    Stress: Not on file   Relationships     Social connections     Talks on phone: Not on file     Gets together: Not on file     Attends religious service: Not on file     Active member of club or organization: Not on file     Attends meetings of clubs or organizations: Not on file     Relationship status: Not on file    Intimate partner violence     Fear of current or ex partner: Not on file     Emotionally abused: Not on file     Physically abused: Not on file     Forced sexual activity: Not on file   Other Topics Concern    Not on file   Social History Narrative    Not on file       Family History   Problem Relation Name Age of Onset    Lupus Sister ##Sister1        ROS     Pain Diagram    Physical Exam    Studies:   ***    DIAGNOSIS:   Multiple sclerosis (CMS code)    ASSESSMENT AND PLAN:   Alex Mann is a 49 y.o. male with MS with severe lower extremity spasticity. He presents today for a baclofen pump consult.     I, Tawanna Sat am acting as a Education administrator for services provided by Quintella Baton, MD on 08/19/2019 8:11 AM  ***

## 2019-11-08 MED ORDER — DALFAMPRIDINE ER 10 MG TABLET,EXTENDED RELEASE,12 HR
10 | ORAL | 1 refills | 90.00000 days | Status: DC
Start: 2019-11-08 — End: 2020-11-10

## 2020-01-15 NOTE — Telephone Encounter (Signed)
Called and left a voice message to rescheduled missed appointment in June.  Sent an email to patient as well.  -------------------------------------  From: Serafina Mitchell   Sent: Wednesday, January 15, 2020 3:07 PM  To: [email or web address]  Subject: Follow up appointment with Dr. Dorna Leitz Trish Fountain,    I hope all is well.    Reaching out to help reschedule your missed appointment with Dr. Joeseph Amor.  Below are his next available dates.  Please let me know which date and time will work for you.  Date and time listed are for video (Zoom) visit.  Please let me know if an in-person appointment is preferred.    July 6  8:30 AM, 9:00 AM  July 8  8:30 AM, 9:00 AM, 10:30 AM, 11:00 AM  July 13  8:30 AM, 9:00 AM, 10:30 AM, 11:00 AM    In an effort to protect your privacy and to offer secure communication with the MS clinic at [facility], we will be primarily using MyChart to communicate with patients. We will not be actively monitoring this email account. If you send messages using this email account, you may not receive a timely response.  We encourage you to use MyChart for any non-urgent clinical questions or concerns.     Please use the link below to log into MyChart or if you need to sign up for a new account.  Or, [employer] Customer Service at [phone]. They are available 24 hours a day, seven days a week.    [email or web address]     Best regards,     Bryson Ha  Patient Care Coordinator    [facility]  Department of Neurology  [facility]  Phone: [phone]  Fax: [phone]  Email: [email or web address]

## 2020-02-09 HISTORY — PX: EXPLORATORY LAPAROTOMY: AMBSHX0061

## 2020-03-19 NOTE — Telephone Encounter (Signed)
Patient has called a ACD line inform me that he feels as though he is have a relapse. Per patient, he stated that about a month ago, he went to the ER due to losing consciousness and losing his pulse. He stated that he had to under go surgery due to his "intestines were tangled" he stated that 3 feet of his intestine were remove because it was no longer viable. He did inform me that he went septic after the surgery. He is currently still recovering at [facility]. He also stated that his legs are contracting and mentioned that his left leg is bent and "stuck to this buttock' and feels as though his right leg will be doing the same soon.     Confirmed that the number on file is a good call back number

## 2020-03-19 NOTE — Telephone Encounter (Signed)
Encounter created in error

## 2020-03-24 NOTE — Telephone Encounter (Signed)
Pt called and LVM on nursing line requesting for a call back. Reported he missed a call. Noted Lisette, [facility] called pt. Will notify.

## 2020-03-24 NOTE — Telephone Encounter (Signed)
Lvm for pt to help sch appt

## 2020-03-26 ENCOUNTER — Ambulatory Visit: Admit: 2020-03-26 | Payer: MEDICARE | Attending: Neurology | Primary: Physician

## 2020-03-26 DIAGNOSIS — G35 Multiple sclerosis: Secondary | ICD-10-CM

## 2020-03-26 NOTE — Progress Notes (Signed)
F/U MS     Meds:    Rituximab (last infusion August 2019) -q 6 months  Ampyra 10 mg bid  Ibuprofen 800 mg prn (HA and other pains)  Vit D5000 IU / d  Baclofen 20 mg tid (spasms)  Biotin 10 mg per day      10 Pt ROS () ;  Bladder:+urgency;< 1 per weekincont (at night)-- much improved  Bowel:+urgency    Brain MRI: Not Done (last    )    Labs: Done in hospital     c/o:     Patient called a ACD line last week -- he felt as though he is having a relapse. Per patient, he stated that about a month ago, he went to the ER due to losing consciousness twice, losing his pulse, and needed resuscitation and urgent surgery due to his "intestines were tangled" (necrotic). He had 3 feet of his intestine were resected (1st surgery 2 ft and at a 2nd surgery another foot). He apparently became septic after 1st surgery but none after his 2nd. He is currently still recovering at [facility].      Still at hospital but feeling much much better. Says he has been very well cared for.    He also notes that his legs are contracted with L leg being bent and "stuck to this buttock' and his R leg also being markedly affected. (Much as when I saw him in December). Baclofen helps with spasms. Now willing to have pump.    Not yet vaccinated -- pt waiting for it.          No Exam:    Assess: Likely not a flare but Sx increase related to bowel difficulty    Plan:  Neurosurgery appointment to eval for Baclofen pump.    RV 6 months      I spent a total of 30 minutes in face-to-face time with the patient and in non-face-to-face activities conducted today 03/26/20 directly related to this video visit, including reviewing records and tests, obtaining history and exam, placing  orders, communicating with other healthcare professionals, counseling the patient, family or caregiver, documenting in the medical record, and/or care coordination for the diagnoses above.

## 2020-04-13 NOTE — Telephone Encounter (Signed)
PLVM on nursing line requesting baclofen pump placement appt, stating that he is in a [facility] currently and "moving forward with everything I need to do for recovery."    Can't get into MyChart due to password problems.    Requests call back at [phone].    Routed to Patton Salles, RN for assistance.

## 2020-04-15 NOTE — Telephone Encounter (Signed)
Called and LVM for patient to call me back to schedule a new patient appointment with Dr. Lake Bells.  I asked patient to call me back to schedule

## 2020-05-08 NOTE — Telephone Encounter (Signed)
Called patient and left voicemail about medication.

## 2020-05-13 ENCOUNTER — Ambulatory Visit: Admit: 2020-05-13 | Discharge: 2020-05-18 | Payer: MEDICARE | Attending: Physician | Primary: Physician

## 2020-05-13 DIAGNOSIS — G35 Multiple sclerosis: Secondary | ICD-10-CM

## 2020-05-13 MED ORDER — HYDROCODONE 5 MG-ACETAMINOPHEN 325 MG TABLET
5-325 | ORAL | 0.00 refills | 30.00000 days | Status: AC | PRN
Start: 2020-05-13 — End: ?

## 2020-05-13 MED ORDER — SENNOSIDES 8.6 MG TABLET
8.6 | ORAL | Status: AC | PRN
Start: 2020-05-13 — End: ?

## 2020-05-13 MED ORDER — ACETAMINOPHEN 325 MG TABLET
325 | ORAL | 0.00 refills | 13.00000 days | Status: AC | PRN
Start: 2020-05-13 — End: ?

## 2020-05-13 MED ORDER — PANTOPRAZOLE 40 MG TABLET,DELAYED RELEASE
40 | ORAL | 1.00 refills | 30.00000 days | Status: AC
Start: 2020-05-13 — End: ?

## 2020-05-13 MED ORDER — CHOLECALCIFEROL (VITAMIN D3) 25 MCG (1,000 UNIT) TABLET
1000 | ORAL | Status: AC
Start: 2020-05-13 — End: ?

## 2020-05-13 MED ORDER — AMLODIPINE 10 MG TABLET
10 | ORAL | 2.00 refills | 90.00000 days | Status: AC
Start: 2020-05-13 — End: ?

## 2020-05-13 MED ORDER — CULTURELLE 10 BILLION CELL CAPSULE
10 | ORAL | 0.00 refills | Status: AC
Start: 2020-05-13 — End: ?

## 2020-05-13 MED ORDER — BACLOFEN 10 MG TABLET
10 | ORAL | 1.00 refills | 16.00000 days | Status: AC
Start: 2020-05-13 — End: ?

## 2020-05-13 MED ORDER — ASPIRIN 81 MG TABLET,DELAYED RELEASE
81 | ORAL | Status: AC
Start: 2020-05-13 — End: ?

## 2020-05-13 MED ORDER — MAGNESIUM HYDROXIDE 400 MG/5 ML ORAL SUSPENSION
400 | ORAL | 0.00 refills | Status: AC | PRN
Start: 2020-05-13 — End: ?

## 2020-05-13 MED ORDER — DOCUSATE SODIUM 100 MG CAPSULE
100 | ORAL | Status: AC
Start: 2020-05-13 — End: ?

## 2020-05-13 MED ORDER — POLYETHYLENE GLYCOL 3350 17 GRAM ORAL POWDER PACKET
17 | ORAL | 1.00 refills | 30.00000 days | Status: AC
Start: 2020-05-13 — End: ?

## 2020-05-13 MED ORDER — METHYL SALICYLATE 10 %-MENTHOL 3 % TOPICAL PATCH
10-3 | TOPICAL | 0.00 refills | Status: AC | PRN
Start: 2020-05-13 — End: ?

## 2020-05-13 MED ORDER — BISACODYL 10 MG RECTAL SUPPOSITORY
10 | RECTAL | 0.00 refills | 15.00000 days | Status: AC | PRN
Start: 2020-05-13 — End: ?

## 2020-05-13 MED ORDER — FLEET ENEMA 19 GRAM-7 GRAM/118 ML
19-7 | RECTAL | 0.00 refills | Status: AC | PRN
Start: 2020-05-13 — End: ?

## 2020-05-13 NOTE — Progress Notes (Deleted)
Thank you for referring Alex Mann to the [facility] for evaluation of ***    HISTORY OF PRESENT ILLNESS:  Alex Mann is a 49 y.o. male ***    Patient Active Problem List   Diagnosis    Multiple sclerosis (CMS code)       Past Medical History:   Diagnosis Date    Diplopia     Spasticity     Increasing       No past surgical history on file.    Allergies/Contraindications   Allergen Reactions    Carbamazepine Rash       Current Outpatient Medications   Medication Sig Dispense Refill    baclofen (LIORESAL) 20 mg tablet Take one tablet po TID for spasticity. (Patient not taking: Reported on 07/08/2019  ) 90 tablet 11    biotin 10,000 mcg CAP Take 1 capsule by mouth Daily.      cholecalciferol, vitamin D3, 10,000 unit CAP Take 6,000 Units by mouth Daily.       clonazePAM (KLONOPIN) 0.5 mg tablet Take 0.5 mg by mouth 2 (two) times daily as needed for Anxiety      dalfampridine 10 mg 12 hr tablet TAKE 1 TABLET BY MOUTH 2 TIMES A DAY 180 tablet 1    diazePAM (VALIUM) 5 mg tablet Take 1 tablet PO BID PRN for lower ext spasm and anxiety 60 tablet 5    diptheria-tetanus-acellular pertussis, Tdap, (ADACEL) 2 Lf-(2.5-5-3-5 mcg)-5Lf/0.5 mL injection Inject 0.5 mLs into the muscle once. On 09/29/2014      DOCUSATE SODIUM (COLACE ORAL) Take by mouth.      flu vacc ts 2013, 18-71yrs,-PF 27 mcg/0.1 mL SYRINGE Inject into the skin. On 09/29/2014      furosemide (LASIX) 20 mg tablet Take 20 mg by mouth Daily.      ibuprofen (ADVIL,MOTRIN) 800 mg tablet Take 1 tablet (800 mg total) by mouth 2 (two) times daily. 60 tablet 5    incobotulinumtoxinA (XEOMIN) 100 unit SOLR injection Inject 400 Units into the muscle once. 400 Units 0    magnesium 250 mg TAB Take 500 mg by mouth nightly at bedtime.       oxybutynin (DITROPAN XL) 5 mg 24 hr tablet Take 1 tablet (5 mg total) by mouth Daily. (Patient not taking: Reported on 07/08/2019  ) 30 tablet 3    riTUXimab (RITUXAN) 10 mg/mL injection  Inject 1,000 mg into the vein every 6 (six) months      sildenafil (VIAGRA) 100 mg tablet TAKE 1 TABLET BY MOUTH 30 MINUTES PRIOR TO ACTIVITY 10 tablet 6     Current Facility-Administered Medications   Medication Dose Route Frequency Provider Last Rate Last Admin    incobotulinumtoxinA (XEOMIN) injection 600 Units  600 Units Intramuscular Once Douglas S. Goodin, MD           There are no discontinued medications.    Social History     Socioeconomic History    Marital status: Single     Spouse name: Not on file    Number of children: Not on file    Years of education: Not on file    Highest education level: Not on file   Occupational History    Not on file   Tobacco Use    Smoking status: Former Smoker    Smokeless tobacco: Never Used    Tobacco comment: Non Smoker   Substance and Sexual Activity    Alcohol use: Yes  Comment: Occasional alcohol    Drug use: Yes     Types: Marijuana     Comment: occasional    Sexual activity: Not on file   Other Topics Concern    Not on file   Social History Narrative    Not on file     Social Determinants of Health     Financial Resource Strain:     Difficulty of Paying Living Expenses:    Food Insecurity:     Worried About Programme researcher, broadcasting/film/video in the Last Year:     Barista in the Last Year:    Transportation Needs:     Freight forwarder (Medical):     Lack of Transportation (Non-Medical):    Physical Activity:     Days of Exercise per Week:     Minutes of Exercise per Session:    Stress:     Feeling of Stress :    Social Connections:     Frequency of Communication with Friends and Family:     Frequency of Social Gatherings with Friends and Family:     Attends Religious Services:     Active Member of Clubs or Organizations:     Attends Engineer, structural:     Marital Status:    Intimate Partner Violence:     Fear of Current or Ex-Partner:     Emotionally Abused:     Physically Abused:     Sexually Abused:        Family  History   Problem Relation Name Age of Onset    Lupus Sister         ROS     Pain Diagram    Physical Exam    Studies:   ***    DIAGNOSIS:  No diagnosis found.    ASSESSMENT AND PLAN:   Alex Mann is a 49 y.o. male ***    I, Hope Pigeon am acting as a Neurosurgeon for services provided by Glendon Axe, MD on 05/12/2020 10:09 PM  ***

## 2020-05-13 NOTE — H&P (Signed)
Thank-you for referring Alex Mann to the [facility] for evaluation of spasticity.     This is a shared service.   Physician Statement of Shared Services  This is a shared visit for services provided by me, Line Rosanne Ashing, MD.  I performed a face-to-face encounter with the patient and contributed to this note.       HISTORY OF PRESENT ILLNESS:  Alex Mann is seen today for a consultation for an intrathecal Baclofen pump. He has a history of multiple sclerosis since at age 30. It started with double vision and he started with Copaxone treatment. His symptoms progressed to bilateral lower extremity weakness at age 23 and he began using a walker. At age 48, his symptoms progressed further and he is now unable to use his legs or weightbear at all. He has also received treatment with Tysabri and Rituximab. His has significant spasticity that is worse in his lower extremities, bilaterally. He takes oral Baclofen 10mg  QID.     He is independent of bowel and bladder. He denies any trouble with skin breakdown or UTIs and any history of back surgery.     Patient Active Problem List   Diagnosis    Multiple sclerosis (CMS code)       Past Medical History:   Diagnosis Date    Diplopia     Spasticity     Increasing       No past surgical history on file.     Allergies/Contraindications   Allergen Reactions    Carbamazepine Rash       Current Outpatient Medications   Medication Sig Dispense Refill    cholecalciferol, vitamin D3, 1000 UNITS tablet Take 5,000 Units by mouth daily      amLODIPine (NORVASC) 10 mg tablet Take 10 mg by mouth daily      biotin 10,000 mcg CAP Take 1 capsule by mouth Daily.      clonazePAM (KLONOPIN) 0.5 mg tablet Take 0.5 mg by mouth 2 (two) times daily as needed for Anxiety      dalfampridine 10 mg 12 hr tablet TAKE 1 TABLET BY MOUTH 2 TIMES A DAY 180 tablet 1    diazePAM (VALIUM) 5 mg tablet Take 1 tablet PO BID PRN for lower ext spasm and anxiety  60 tablet 5    diptheria-tetanus-acellular pertussis, Tdap, (ADACEL) 2 Lf-(2.5-5-3-5 mcg)-5Lf/0.5 mL injection Inject 0.5 mLs into the muscle once. On 09/29/2014      DOCUSATE SODIUM (COLACE ORAL) Take by mouth.      flu vacc ts 2013, 18-10yrs,-PF 27 mcg/0.1 mL SYRINGE Inject into the skin. On 09/29/2014      furosemide (LASIX) 20 mg tablet Take 20 mg by mouth Daily.      ibuprofen (ADVIL,MOTRIN) 800 mg tablet Take 1 tablet (800 mg total) by mouth 2 (two) times daily. 60 tablet 5    incobotulinumtoxinA (XEOMIN) 100 unit SOLR injection Inject 400 Units into the muscle once. 400 Units 0    magnesium 250 mg TAB Take 500 mg by mouth nightly at bedtime.       oxybutynin (DITROPAN XL) 5 mg 24 hr tablet Take 1 tablet (5 mg total) by mouth Daily. (Patient not taking: Reported on 07/08/2019  ) 30 tablet 3    riTUXimab (RITUXAN) 10 mg/mL injection Inject 1,000 mg into the vein every 6 (six) months      sildenafil (VIAGRA) 100 mg tablet TAKE 1 TABLET BY MOUTH 30 MINUTES PRIOR TO ACTIVITY  10 tablet 6     Current Facility-Administered Medications   Medication Dose Route Frequency Provider Last Rate Last Admin    incobotulinumtoxinA (XEOMIN) injection 600 Units  600 Units Intramuscular Once Douglas S. Goodin, MD           Medications Discontinued During This Encounter   Medication Reason    baclofen (LIORESAL) 20 mg tablet * Patient reported stopped med - no eCancel/exclude from AVS    cholecalciferol, vitamin D3, 10,000 unit CAP * Patient reported stopped med - no eCancel/exclude from AVS         Social History     Socioeconomic History    Marital status: Single     Spouse name: Not on file    Number of children: Not on file    Years of education: Not on file    Highest education level: Not on file   Occupational History    Not on file   Tobacco Use    Smoking status: Former Smoker    Smokeless tobacco: Never Used    Tobacco comment: Non Smoker   Substance and Sexual Activity    Alcohol use: Yes     Comment:  Occasional alcohol    Drug use: Yes     Types: Marijuana     Comment: occasional    Sexual activity: Not on file   Other Topics Concern    Not on file   Social History Narrative    Not on file     Social Determinants of Health     Financial Resource Strain:     Difficulty of Paying Living Expenses:    Food Insecurity:     Worried About Programme researcher, broadcasting/film/video in the Last Year:     Barista in the Last Year:    Transportation Needs:     Freight forwarder (Medical):     Lack of Transportation (Non-Medical):    Physical Activity:     Days of Exercise per Week:     Minutes of Exercise per Session:    Stress:     Feeling of Stress :    Social Connections:     Frequency of Communication with Friends and Family:     Frequency of Social Gatherings with Friends and Family:     Attends Religious Services:     Active Member of Clubs or Organizations:     Attends Engineer, structural:     Marital Status:    Intimate Partner Violence:     Fear of Current or Ex-Partner:     Emotionally Abused:     Physically Abused:     Sexually Abused:        Family History   Problem Relation Name Age of Onset    Lupus Sister         Review of Systems   Constitutional: Negative.    HENT: Negative.    Eyes: Negative.    Respiratory: Negative.    Cardiovascular: Negative.    Gastrointestinal: Negative.    Genitourinary: Negative.    Musculoskeletal: Positive for joint pain and myalgias. Negative for back pain, falls and neck pain.   Skin: Negative.    Neurological: Negative.    Endo/Heme/Allergies: Negative.    Psychiatric/Behavioral: Negative.        Physical Exam  Constitutional:       General: He is not in acute distress.     Appearance: Normal appearance. He is normal weight. He is  not ill-appearing, toxic-appearing or diaphoretic.   HENT:      Head: Normocephalic.      Nose: Nose normal.      Mouth/Throat:      Mouth: Mucous membranes are moist.   Eyes:      General:         Right eye: No discharge.          Left eye: No discharge.      Conjunctiva/sclera: Conjunctivae normal.      Pupils: Pupils are equal, round, and reactive to light.   Musculoskeletal:         General: No swelling or tenderness. Normal range of motion.      Cervical back: Normal range of motion and neck supple. No rigidity or tenderness.   Lymphadenopathy:      Cervical: No cervical adenopathy.   Neurological:      Mental Status: He is alert and oriented to person, place, and time. Mental status is at baseline.      GCS: GCS eye subscore is 4. GCS verbal subscore is 5. GCS motor subscore is 6.      Cranial Nerves: Cranial nerves are intact.      Motor: Abnormal muscle tone present.      Comments: On a stretcher and both legs with deformity and right UE and spasticity  Self catheter   Bowel regimen  Had several abdominal incision and scar midline and on the right side  No wound or infection            DIAGNOSIS:   Multiple sclerosis (CMS code)    Assessment and Plan:  Alex Mann is seen today for a consultation for an intrathecal Baclofen pump. He has significant spasticity and it a good candidate for an intrathecal Baclofen trial.   We reviewed the alternatives risks and benefits and he wants to proceed   We also did show him the pump and discussed the technology with him  He wants to proceed  Plan  Schedule intrathecal Baclofen trial

## 2020-05-15 ENCOUNTER — Ambulatory Visit: Admit: 2020-05-15 | Discharge: 2020-05-15 | Disposition: A | Discharge status: Home / Self Care | Payer: Self-pay

## 2020-05-15 DIAGNOSIS — K432 Incisional hernia without obstruction or gangrene: Secondary | ICD-10-CM

## 2020-06-17 NOTE — Telephone Encounter (Signed)
Rituxan order securely emailed to provider.

## 2020-06-23 ENCOUNTER — Encounter: Payer: Self-pay | Admitting: Surgery

## 2020-06-24 ENCOUNTER — Ambulatory Visit: Payer: Medicare Other | Attending: Surgery | Admitting: Surgery

## 2020-06-24 ENCOUNTER — Encounter: Payer: Self-pay | Admitting: Surgery

## 2020-06-24 VITALS — BP 105/69 | HR 84 | Temp 98.0°F | Resp 16

## 2020-06-24 DIAGNOSIS — K432 Incisional hernia without obstruction or gangrene: Secondary | ICD-10-CM | POA: Insufficient documentation

## 2020-06-24 DIAGNOSIS — Z1211 Encounter for screening for malignant neoplasm of colon: Secondary | ICD-10-CM

## 2020-06-24 DIAGNOSIS — G35 Multiple sclerosis: Secondary | ICD-10-CM

## 2020-06-24 NOTE — Nursing Note (Signed)
Temp: 36.7 C (98 F) (12/08 0829)  Temp src: Skin (12/08 0829)  Pulse: 84 (12/08 0829)  BP: 105/69 (12/08 0829)  Resp: 16 (12/08 0829)  SpO2: 97 % (12/08 0829)  Height: --  Weight: --    Vital signs taken, chief complaint noted, allergies verified and screened for pain. Patient WAS wearing a surgical mask  Contact precautions were followed when caring for the patient.   PPE used by provider during encounter: Surgical mask    Jenkins Rouge, [place]  Outpatient Surgery   [phone]

## 2020-06-24 NOTE — Patient Instructions (Addendum)
Call to schedule your DVT ultrasound  After this appointment date, call Dr. W[facility] for a pre-op appt [phone]      HERNIA INFORMATION  A hernia is a defect in your abdominal wall. Hernias develop most often near the belly button. Common kinds include:   Umbilical hernia. This kind of hernia develops near the navel.   Incisional hernia. This kind of hernia bulges through a scar from an abdominal surgery.    The abdominal wall is made up of muscle and tissues that attach those muscles to each other and to bone. These provide strength to the abdominal wall to hold all of the contents of the abdominal cavity inside. Sometimes there is an opening in the abdominal wall allowing what is inside to push through to the outside. This is called a hernia.    Some hernias occur in natural openings in the abdominal wall that allow body structures to go from the inside to the outside of the body. For example, an umbilical hernia occurs in the abdominal wall near the belly button (umbilicus) where the umbilical cord was.    When people have abdominal surgery, sometimes the incisions where the abdominal cavity was entered do not heal well, and a hernia can form in this location. These are known as incisional hernias.     What are the causes?  This condition may be caused by a weakness in the strong connective tissue (fascia) of your abdominal wall combined with:   Heavy lifting.   An incision made during an abdominal surgery.   A birth defect (congenital defect).   Excess weight or obesity.   Smoking.   Poor nutrition.   Cystic fibrosis.   Excess fluid in the abdomen.   Undescended testicles.    What are the signs or symptoms of a hernia?   A lump on the abdomen. This is the first sign of a hernia. The lump may become more obvious with standing, straining, or coughing. It may get bigger over time if it is not treated or if the condition causing it is not treated. The lump is from fat or intestines from the  abdominal wall that bulges out.   Pain. A hernia is usually painless, but it may become painful or uncomfortable over time if treatment is delayed. The pain is usually dull and may get worse with standing or lifting heavy objects.     Sometimes a hernia gets tightly squeezed in the weak spot (strangulated) or stuck there (incarcerated) and causes additional symptoms. These symptoms may include: Vomiting; Nausea; Constipation; Irritability.     What Should You Do If You Have a Hernia?  If you have a hernia, it is best to avoid straining and heavy lifting. If you have to strain to urinate, you should be evaluated by a clinician to see if it can be treated. Avoiding constipation is a good idea, and using laxatives or stool-bulking agents like psyllium may be helpful. You should be evaluated by a physician to determine if you should have the hernia repaired.    How is this treated?  - A hernia that is small and painless may not need to be fixed. A hernia that is large or painful may be treated with surgery.   - Hernias surgery to prevent incarceration or strangulation (bulge of fat or intestines from inside the abdomen through the defect, with the inability of the bulge to be pushed back in). -     Symptoms of Incarceration or Strangulation  include: redness, fevers, inability to tolerate food (with associated nausea and vomiting), and/or limited bowel function (lack of flatus or bowel movement).  - Strangulated hernias are always treated with surgery, because lack of blood to the trapped organ or tissue can cause it to die. If you have those symptoms, seek out care by a medical professional or come to the ER if they have persistent symptoms.    Follow these instructions at home:  - There are no restrictions regarding any weight lifting or other physical activity.   - The benefits of physical exercise outweigh the risks of any worsening of your hernia.    - However, you should reduce activity if you experience worsening  groin/abdominal pain with certain activities.   Prevent constipation. Constipation leads to straining with bowel movements, which can make a hernia worse or cause a hernia repair to break down. You can prevent constipation by:   Eating a high-fiber diet that includes plenty of fruits and vegetables.   Drinking enough fluids to keep your urine clear or pale yellow. Aim to drink 6-8 glasses of water per day.   Lose weight, if you are overweight.   Do not use any tobacco products, including cigarettes, chewing tobacco, or electronic cigarettes. If you need help quitting, ask your health care provider.   Keep all follow-up visits as directed by your health care provider. This is important.    Hernias  Your health care provider may need to monitor your condition. Contact a health care provider if:   You have swelling, redness, and pain in the affected area.   Your bowel habits change.    Get help right away if:   You have a fever.   You have abdominal pain that is getting worse.   You feel nauseous or you vomit.   You cannot push the hernia back in place by gently pressing on it while you are lying down.   Your hernia changes in shape or size, is stuck outside the abdomen, becomes discolored or feels hard or tender.        Types of Repair for Abdominal Hernia  There are 2 surgical approaches to hernia repair: open surgery and laparoscopic (with or without the robot) surgery.    The approach to fixing a hernia depends on multiple factors, including previous surgery, size of the hernia, whether abdominal contents are stuck (incarcerated), and general health.    After a patient receives medication to relax him or her and prevent pain, an open hernia repair is carried out by a surgeon who makes an incision over the hernia and identifying the abdominal contents that protrude through the weak area of the abdomen. These contents are contained in a structure called the hernia sac. The hernia sac is separated from  important structures and then returned back into the abdomen. The hernia defect is repaired by bringing the groin tissues back together. In most circumstances, a prosthetic (man-made) mesh is placed under the hernia defect to decrease the chance that the hernia recurs. Hernia surgery done by pulling the muscles at the edge of the hernia together with stitches (sutures) not using mesh, is generally not recommended for larger hernias, as the risk of a recurrent hernia is high.  The incision is then closed with small staples that are later removed or with stitches (sutures) that dissolve on their own over time. The wound is then covered with a dressing.    Most patients undergoing an elective hernia repair with either  go home the same day or spend a few days in the hospital, depending on the size of the hernia. They can go home once their pain is controlled, they have urinated, and they are able to tolerate food or liquids without nausea or vomiting.    Laparoscopic hernia repair, with or without the Robot    This is performed with general anesthesia and requires use of a breathing tube.    Small 1/2 inch incisions are made in the abdomen. In laparoscopic hernia repair, a camera called a laparoscope is inserted into the abdomen to visualize the hernia defect on a monitor. The image on the monitor is used to guide the surgeon's movements. The hernia sac is removed from the defect in the abdominal wall, and a prosthetic mesh is then placed to cover the hernia defect. While doing this, surgeons are careful to avoid injuring the intestines and blood vessels that can bleed. The small incisions are closed with stitches (sutures) that dissolve on their own over time.    The majority of patients undergoing elective or nonemergent hernia repair go home the same day as the surgery once their pain is under control, they have urinated, and they are able to tolerate food or liquids without nausea or vomiting.      Postsurgery Care  at Home    Most patients can expect soreness over the first 5-7 days after surgery. Take pain medication as prescribed by your surgeon. Some patients may experience bruising (ecchymosis) in and around the groin area. This is normal. However, if there is significant swelling in the groin, you should contact your surgeon. You should walk every day but limit strenuous activity such as running and lifting anything over 10 to 15 lb (the equivalent of a gallon of milk) for about 4-6 weeks. In general, if an activity hurts, it shouldn't be done. Constipation and straining during bowel movements increases pressure on the repair and should be avoided; eat a high-fiber diet and use stool softeners if needed.    Contact your surgeon if you experience fever higher than 100.36F, shaking chills, pain that gets worse over time, inability to urinate, inability to eat without feeling nauseous, or redness or pus draining from the incision(s).    Lehman Prom [facility]. What Is an Abdominal Wall Hernia? JAMA. 2016;316(15):1610. doi:10.1001/jama.5374.82707

## 2020-06-24 NOTE — Progress Notes (Signed)
GENERAL SURGERY ATTENDING ATTESTATION NOTE  06/24/2020    Referred by: Adrienne Mocha  Referral for: incisional ventral hernia    The patient was seen and evaluated with the medical student Lehman Prom).      The student note reviewed and confirmed.  I agree with the plan developed and documented in the note.      The patient completed the health history questionnaire, and this information was reviewed with the patient. The reviewed information includes the following:    Past Medical History:   Diagnosis Date    Hypertension     Immobility     bed/wheelchair bound    Multiple sclerosis ([place])       Past Surgical History:   Procedure Laterality Date    EXPLORATORY LAPAROTOMY      SB resection and appendectomy for SBO, complicated by sepsis, SMV thrombosis    EXPLORATORY LAPAROTOMY      SB resection 2nd time      Patient's Medications   New Prescriptions    No medications on file   Previous Medications    AMLODIPINE (NORVASC) 10 MG TABLET    Take 10 mg by mouth every day.    ASPIRIN (ASPIR-81) 81 MG EC TABLET    Take 1 tablet by mouth every morning.    BACLOFEN (LIORESAL) 10 MG TABLET    Take 10 mg by mouth 4 times daily.    DOCUSATE (COLACE) 100 MG CAPSULE    Take 100 mg by mouth 2 times daily.    PANTOPRAZOLE (PROTONIX) 40 MG DELAYED RELEASE TABLET    Take 40 mg by mouth every morning before a meal.    POLYETHYLENE GLYCOL 3350 (MIRALAX) 17 GRAM/DOSE POWDER    Take 17 g by mouth every morning. Mix in 4 to 8 oz of water, soda, coffee, juice or tea.    SENNOSIDES (SENNA) 8.6 MG CAPSULE    Take 1 capsule by mouth 2 times daily.    VITAMIN D PO    Take 1,000 Units by mouth every morning.   Modified Medications    No medications on file   Discontinued Medications    No medications on file        Tegretol [Carbamazepine]    Unknown-Explain in Comments    Comment:unknown   Social History     Socioeconomic History    Marital status: SINGLE     Spouse name: Not on file    Number of children: Not on file    Years of  education: Not on file    Highest education level: Not on file   Occupational History    Not on file   Tobacco Use    Smoking status: Not on file    Smokeless tobacco: Not on file   Substance and Sexual Activity    Alcohol use: Not on file    Drug use: Not on file    Sexual activity: Not on file   Other Topics Concern    Not on file   Social History Narrative    Not on file     Social Determinants of Health     Financial Resource Strain: Not on file   Food Insecurity: Not on file   Transportation Needs: Not on file   Physical Activity: Not on file   Stress: Not on file   Social Connections: Not on file   Intimate Partner Violence: Not on file   Housing Stability: Not on file  FHx non contributory      Physical Exam:  BP 105/69 (SITE: left arm, Orthostatic Position: sitting, Cuff Size: regular)   Pulse 84   Temp 36.7 C (98 F) (Skin)   Resp 16   SpO2 97%   Physical Exam  Well-appearing man laying in gurney with chronic lower extremity and right arm contractures  Breathing unlabored on RA  Abd soft ND mildly TTP in midline; midline incision with periumbilical large hernia (defect ~8-41QK) and supraumbilical ~2KS defect, all reducible    Studies:  [facility] scan [facility]-20-2020 per patient, do not have the records    Assessment and Plan:   The patient was seen with the medical student and the medical student's note was reviewed and edited.  The following plan was developed with the medical student.  The planned treatment, risks, benefits, and alternatives were discussed in detail with the patient.  There were no barriers to communication identified.    Report electronically signed by:   Attending    Shellia Cleverly, MD, MTM  Assistant Professor  Division of Foregut, Metabolic, and General Surgery   [facility]   [address], [place]  [place], [place] [address]  Clinic [phone]  Pager [phone]      Total time I spent in care of this patient today (excluding time spent on other  billable services) was 60 minutes with a diagnosis of (K43.2) Incisional hernia, without obstruction or gangrene  (primary encounter diagnosis)    (G35) MS (multiple sclerosis) ([place])  Plan: Neurology Clinic Referral, VL VENOUS DUPLEX,         LOWER EXTREMITY, BILATERAL    (Z12.11) Encounter for screening colonoscopy  Plan: COLONOSCOPY  .

## 2020-06-24 NOTE — Progress Notes (Signed)
[facility]  New Patient Visit      DATE OF VISIT:  06/24/2020    REFERRING PROVIDER:  Graylon Good, MD  PCP: Patient, No Pcp Per    REASON FOR CONSULTATION: Ventral Hernia    HISTORY OF PRESENT ILLNESS:  Roy Henry is a(n) 50yr year-old man seen in consultation requested by Roy Good, MD  for the above chief complaint.     He has a history of multiple sclerosis and used to live semi-independently. However, he developed a small bowel obstruction requiring emergent Ex Lap on 02/09/20 with small bowel resection (per pt 1 foot out) and appendectomy in [facility]. This was complicated by SMV thrombosis and bowel ischemia and was taken back to the OR shortly after for persistent bowel necrosis (3 feet resected) and sepsis. His postop course was complicated by infections and required Invanz and diflucan, needed TPN, and was eventually transitioned to an oral diet. He denies any wound issues with his incision.    He was discharged to [facility] on 02/26/20, and has been at [facility] post- acute rehab since then.    Three weeks ago when rolling over towards supine position, he felt a "pop" in his abdomen and noticed a bulge under his umbilicus. He was seen in he ED at [facility] where he was diagnosed with a ventral hernia. He was provided an abdominal binder in the ED  but due size and discomfort he only wears it up to 6hr per day. Subsequently he experiences  non radiating, aching to dull pain intermittently which is worsened with movement. Pain currently controlled with norco 325mg  daily. Since onset he reports the hernia has increased in size and he has experienced a change in BM. Formerly he passed a BM every other day with mirilax daily which has now changed to approximately every 2-4 days, and is taking miralax daily, colace/senna BID, and Milk of Magnesia and Mag Citrate PRN.    He denies nausea vomiting , fevers or chills.     Of  note, patient reports that the CT imaging in the ED also demonstrated "blockage of blood flow in the R hip". No reported treatment for it.    He also has HTN, chronic constipation, mutiple sclerosis and now wheel chair/Bed bound. For his multiple sclerosis, he is/was managed by [facility] and was on rituximab but has been unable to get scheduled for infusions while at his rehab center. He is frustrated with his inability to get treatment for his neurologic issues or to be able to establish care with [facility].  ------------    Past Medical History:   Diagnosis Date    GERD (gastroesophageal reflux disease)     Hypertension     Immobility     bed/wheelchair bound    Multiple sclerosis ([place])       Past Surgical History:   Procedure Laterality Date    EXPLORATORY LAPAROTOMY  02/09/2020    SB resection and appendectomy for SBO, complicated by sepsis, SMV thrombosis    EXPLORATORY LAPAROTOMY      SB resection 2nd time       Patient's Medications   New Prescriptions    No medications on file   Previous Medications    AMLODIPINE (NORVASC) 10 MG TABLET    Take 10 mg by mouth every day.    ASPIRIN (ASPIR-81) 81 MG EC TABLET    Take 1 tablet by mouth every  morning.    BACLOFEN (LIORESAL) 10 MG TABLET    Take 10 mg by mouth 4 times daily.    DOCUSATE (COLACE) 100 MG CAPSULE    Take 100 mg by mouth 2 times daily.    PANTOPRAZOLE (PROTONIX) 40 MG DELAYED RELEASE TABLET    Take 40 mg by mouth every morning before a meal.    POLYETHYLENE GLYCOL 3350 (MIRALAX) 17 GRAM/DOSE POWDER    Take 17 g by mouth every morning. Mix in 4 to 8 oz of water, soda, coffee, juice or tea.    SENNOSIDES (SENNA) 8.6 MG CAPSULE    Take 1 capsule by mouth 2 times daily.    VITAMIN D PO    Take 1,000 Units by mouth every morning.   Modified Medications    No medications on file   Discontinued Medications    No medications on file      Allergies:    Tegretol [Carbamazepine]    Unknown-Explain in Comments    Comment:unknown    No family history on  file.     No tobacco, EtOH, drugs  Social History     Socioeconomic History    Marital status: SINGLE     Spouse name: Not on file    Number of children: Not on file    Years of education: Not on file    Highest education level: Not on file   Occupational History    Not on file   Tobacco Use    Smoking status: Not on file    Smokeless tobacco: Not on file   Substance and Sexual Activity    Alcohol use: Not on file    Drug use: Not on file    Sexual activity: Not on file   Other Topics Concern    Not on file   Social History Narrative    Not on file     Social Determinants of Health     Financial Resource Strain: Not on file   Food Insecurity: Not on file   Transportation Needs: Not on file   Physical Activity: Not on file   Stress: Not on file   Social Connections: Not on file   Intimate Partner Violence: Not on file   Housing Stability: Not on file       REVIEW OF SYSTEMS:    Constitutional: negative for fever or chills   Eyes: negative for red eyes, poor vision, discharge  Neuro: negative for strokes or seizures  CV: negative for chest pain, MI  Resp: negative. No SOB or cough  GI:  Per HPI   GU: negative for difficulty urinating  Musculoskeletal: contractures  Skin negative for jaundice, itch, rash    PHYSICAL EXAMINATION:    BP 105/69 (SITE: left arm, Orthostatic Position: sitting, Cuff Size: regular)   Pulse 84   Temp 36.7 C (98 F) (Skin)   Resp 16   SpO2 97%     GENERAL:  alert and oriented x 3, laying down in transported gurney no acute distress  HEENT: Grossly within normal limits.  RESP: breathing unlabored on RA  CARDIOVASCULAR:  Extremities warm and well perfused.  ABDOMEN: Soft nondistended. Incisional scar midline. Small 2-3 cm hernia defect noted cephalad of the umbilicus non tender and reducible. Approximately 10cm hernia defect in mid-abdomen encompassing the umbilicus; mild TTP, soft, and reducible. No overlying erythema   GROINS/GU: No overlying erythema or other skin changes.    EXTREMITIES: severe contractures bilateral lower extremities and right arm  NEUROLOGIC: not assessed    RADIOLOGY:   Pending    IMPRESSION:  This is a 71yr year-old  man with symptomatic incisional ventral hernia from his 2 ex laps in July 2021. We discussed conservative mgmt with observation and abdominal binder versus surgical repair. He states the pain is limited his mobility since he still gets in/out of bed, and sits upright, and he desires repair.    The hernias are soft and reducible and thus concern for strangulation of bowel is low, however he was educated about the signs of SBO and when to go to the ER. An abdominal binder as a bridge to surgery was provided for patient comfort.    Given that his constipation will increase the risk of hernia recurrence with straining, optimization of this is ideal.    PLAN:    -  Mirilax PO increase to BID  - Milk of Magnesia increase from PRN to daily  - Continue docusate and senna  - Abdominal Binder    - Will request records and CD of CT scan from [facility]  - Given his questionable hip vessel "blockage", we are concerned whether this may represent a DVT; thus  - vascular lab bilateral LE DVT US ordered as he is high risk given his immobility and will also assess whether he needs anticoagulation    - After the above, he will return for a preop appointment. He is a candidate for an open VHR and may need component separation; we briefly discussed his postoperative course  - referral for colonoscopy given his constipation and changes in bowel habits  - Neurology referral for mgmt of his multiple sclerosis      Electronically signed by  Lehman Prom, MS3           .

## 2020-07-01 ENCOUNTER — Ambulatory Visit
Admission: RE | Admit: 2020-07-01 | Discharge: 2020-07-01 | Disposition: A | Discharge status: Home / Self Care | Payer: Medicare Other | Source: Ambulatory Visit | Attending: Vascular Surgery | Admitting: Vascular Surgery

## 2020-07-01 DIAGNOSIS — G35 Multiple sclerosis: Secondary | ICD-10-CM | POA: Insufficient documentation

## 2020-07-06 ENCOUNTER — Telehealth: Payer: Self-pay | Admitting: Neurology

## 2020-07-06 NOTE — Telephone Encounter (Signed)
Rituxan order faxed out to [facility].     Pt notified via mychart.

## 2020-07-06 NOTE — Telephone Encounter (Signed)
**  Fax was sent to [facility] at: [phone] requesting MRI Images/CT Scan to be pushed over via [employer].    * Will submit request to [facility] for Images to be archived once they've been received and uploaded.      Sander Radon, [facility]

## 2020-07-13 ENCOUNTER — Other Ambulatory Visit: Payer: Self-pay | Admitting: Neurology

## 2020-07-13 NOTE — Telephone Encounter (Signed)
MRI Brain performed on 04/16/2013 was submitted to [facility] to be archived.    MRI C-Spine performed on 04/16/2013 was submitted to [facility] to be archived.    MRI Brain performed on 09/09/2013 was submitted to [facility] to be archived.    Sander Radon, [facility]

## 2020-07-20 ENCOUNTER — Telehealth: Payer: Self-pay | Admitting: Surgery

## 2020-07-20 NOTE — Telephone Encounter (Signed)
Caller Name: Edwyna [facility]    Reason for call: schedule surgery and pre-op    Call back number: [phone]    Message to relay:     He is calling to schedule surgery and pre-op appt for patient. Pt is at [facility]. Please contact him back.    [facility]  [phone]

## 2020-07-21 ENCOUNTER — Ambulatory Visit: Payer: Medicare Other | Attending: Neurology | Admitting: Neurology

## 2020-07-21 ENCOUNTER — Encounter: Payer: Self-pay | Admitting: Neurology

## 2020-07-21 VITALS — BP 135/90 | HR 104 | Temp 98.2°F | Ht 73.0 in | Wt 145.0 lb

## 2020-07-21 DIAGNOSIS — G35 Multiple sclerosis: Secondary | ICD-10-CM | POA: Insufficient documentation

## 2020-07-21 NOTE — Nursing Note (Signed)
Identify by two identifiers vitals taken, allergies and pain level verified, pharmacy updated.   Patient WAS wearing a surgical mask  Contact and Droplet precautions were followed when caring for the patient.   PPE used by provider during encounter: Surgical mask and face shield     Katalina Magri MA

## 2020-07-21 NOTE — Telephone Encounter (Signed)
Dr. Lorenz Coaster is requesting MRI Images to be Pushed over via [employer] or images on disc to be mailed to our office.    **Fax was sent to [facility] at: [phone] requesting the following MRI Images to be pushed over via [employer]:    1. MRI Brain from 08/2015  2. MRI T-Spine from 07/2015  3. MRI L-Spine from 05/2015  4. MRI C-Spine from 09/2013    **Also requested Neurology visit notes & Lab Results      * Will submit request to [facility] for Images to be archived once they've been received and uploaded.      [facility] notes from the Rituximab infusion patient received ~1 week ago was requested, received & given to Dr.  Lorenz Coaster for review.      Sander Radon, [facility]

## 2020-07-21 NOTE — Progress Notes (Addendum)
[name]  [address]  [place], [place]  [address]  Phone [phone] * Fax [phone]      PATIENT:  Roy Henry  MRN:         [id number]  DATE:        07/21/2020    History of Present Illness:   This is a 50yr old male w/ a history of multiple sclerosis,currently on rituximab.   The onset of symptoms was when he was 36 years ago with symptoms of lower extremity weakness, progressing to inability to bear weight. Pt suspects he has had another relapse in the past year, since his spasticity and contractures progressively worsened. Roy Henry was previously on copaxone, tysabri and rituximab treatments in the past, all of which have worked well in the past, according to him. He also received multiple botox injections for his contractures before COVID, and that, according to patient, has helped with his symptoms as well. Ampyra was helpful in the past; however, was accidentaly stopped during his transfer from one acute rehab to another as pt believes they lost some records during the transfer. His most recent rituximab dose was 1 week ago; however, patient's infusion prior to most recent one was almost 2 years ago. Roy Henry said that because of COVID-19 he had difficulty organizing a transportation to get infusion at the center. When Roy Henry was on regular infusions, he reported to not have any relapses at that time.     Roy Henry recent hx is notable for multiple acute rehabilitation facilities since 01/2020 following surgery for necrotic bowel and SBO, then GLF and tendon rupture on his leg. Pt is currently at the [facility] in [place].    Last MRI done in 2017.    Residual MS symptoms include spasticity, contractures, lag in eye movements on horizontal follow, tingling in feet, knees bilaterally and R arm.     He has full bladder and bowel control; however, using diapers given inability to void independently bc of positioning.     Past medical  history:  There are no problems to display for this patient.    Past Medical History:   Diagnosis Date    GERD (gastroesophageal reflux disease)     Hypertension     Immobility     bed/wheelchair bound    Multiple sclerosis ([facility])      Allergies:    Tegretol [Carbamazepine]    Unknown-Explain in Comments    Comment:unknown    Medications:     Current Outpatient Medications:     Amlodipine (NORVASC) 10 mg Tablet, Take 10 mg by mouth every day., Disp: , Rfl:     Aspirin (ASPIR-81) 81 mg EC Tablet, Take 1 tablet by mouth every morning., Disp: , Rfl:     Baclofen (LIORESAL) 10 mg Tablet, Take 10 mg by mouth 4 times daily., Disp: , Rfl:     Docusate (COLACE) 100 mg Capsule, Take 100 mg by mouth 2 times daily., Disp: , Rfl:     Pantoprazole (PROTONIX) 40 mg Delayed Release Tablet, Take 40 mg by mouth every morning before a meal., Disp: , Rfl:     Polyethylene Glycol 3350 (MIRALAX) 17 gram/dose Powder, Take 17 g by mouth every morning. Mix in 4 to 8 oz of water, soda, coffee, juice or tea., Disp: , Rfl:     Sennosides (SENNA) 8.6 mg Capsule, Take 1 capsule by mouth 2 times daily., Disp: , Rfl:  VITAMIN D PO, Take 1,000 Units by mouth every morning., Disp: , Rfl:     Social history:  He lives in the acute rehab center. Has been there for the past 4 months since his hospitalizations 6 months ago. He reports smoking in the past, but stopped at the age of 31. No current alcohol or other recreational drug use.     Family history:   No family history of multiple sclerosis in family members. Young sister diagnosed with lupus at the age of 84.     Review of Systems:  See Dr. Lorenz Coaster note.    Examination:  Temp: 36.8 C (98.2 F) (01/04 1120)  Temp src: Skin (01/04 1120)  Pulse: 104 (01/04 1126)  BP: 135/90 (01/04 1126)  Resp: --  SpO2: --  Height: 185.4 cm (6\' 1" ) (01/04 1120)  Weight: 65.8 kg (145 lb) (01/04 1120)     General Physical Exam:  General:  Alert, oriented, pleasant. In a gurney, w/ severe contractures  of lower extremities.  HEENT:  Unremarkable.  Extremities:  No edema.  Skin:  No rashes    Neuro Exam:  Mental Status:   Alert, oriented, normal speech and language.    Cranial Nerves:  CN 2:        Pupils ERRL.  CN 3,4,6:  EOM full, horizontal nystagmus noted.  CN 5:        Sensation to LT intact bilaterally.    CN 7:        Facial symmetry normal.  CN 8:        Hearing intact to finger rub bilaterally.  CN 9, 10:  Palate elevation midline.  CN 11:      Trapezius strength 5/5 bilaterally.  CN 12:      Tongue protrusion midline.    Motor: Lower extremity spasms on examination. Examination discontinued.     Sensory:  Light touch:  Intact bilaterally      Coordination:  F-to-N:  Mild intention tremor.    Labs/Imaging: see Dr. Rick Duff note    Assessment / Plan:  In summary, Roy Henry is a 50yr old male with a previous diagnosis and worsening symptoms of multiple sclerosis evidenced by contractures, spasticity, tingling sensation in extremities.     #MS   - initiate a rituximab infusion. Two weeks apart from the previous one.   - f/u on labs from [facility]  - then plan to continue rituximab every 6 months.    #Spasticity  - increase valium to 5 mg 4 times daily as tolerated.  - increase baclofen to 20 mg 4 times daily as tolerated.   - referral place to PM&R for baclofen pump   - referral placed to neurology Botox clinic for spasticity management and contracture prevention    I will see the patient in followup in 3 months to review results and reevaluate their MS.    I spent 30 minutes with this patient and greater than 50% of the time was spent counseling about prognosis, plan for further testing, symptomatic management, and immune treatments.    Leota Jacobsen, MS4  [facility]  [employer]  Pager: [phone]

## 2020-07-21 NOTE — Patient Instructions (Signed)
For MS Treatment:  - rituximab induction should be two doses, two weeks apart. Recommend a second dose of rituximab. Will [facility] with [facility]. Will also need lab results from [facility].  - then plan to continue rituximab every 6 months.    Recommend more aggressive spasticity management:  - increase valium to 5 mg 4 times daily as tolerated.  - increase baclofen to 20 mg 4 times daily as tolerated.   - referral place to PM&R for baclofen pump   - referral placed to neurology Botox clinic for spasticity management and contracture prevention

## 2020-07-21 NOTE — Progress Notes (Signed)
This patient was seen, evaluated, and care plan was developed with the [facility] medical student.  I agree with the assessment and plan as outlined in the note. See the note above for detailed H&P.    Briefly, [name] is a 50 year old man with advanced multiple sclerosis, previously followed at [facility] by Dr. Blenda Henry. He recently received rituximab 07/16/20 after being off of MS medications for over two years, in part due to COVID risk and difficulty with transportation for infusions per patient. He was also been prescribed oral dalfampridine 10 mg bid that was accidentally discontinued in transfer of nursing home.    At the last visit with Dr. Blenda Henry on 07/08/19, he had marked worsening of adductor and knee flexor spasticity and he was referred urgently for baclofen pump evaluation in Neurosurgery.  He was seen on  deemed to be a good candidate and there was a plan to schedule a baclofen intrathecal trial at [facility].    However, in 01/2020 he was admitted for a small bowel obstruction, s/p small bowel resection and appendectomy. 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 Since then he has had progression of the LE weakness and spasticity.     He has residual severe LE spasticity that has resulted in profoundly spastic deformities of the legs. It is unclear if there are permanent contractures at this point, but he reports that he can extend his limbs with assistance from physical therapy after getting additional valium and pain medication.     PSH:  02/11/2020 exploratory laparotomy, small bowel resection and appendectomy secondary to small bowel obstruction  02/18/2020 exploratory laparotomy, right colectomy and oversew of previous anastomosis secondary to small anastomotic leak, necrotic terminal ileum    After the visit MA was able to obtain the following outside records:  Rituximab infusion:  [facility] and received notes from [facility].   - dose of Rituximab was 1000 mg x 1 on 07/16/20  - premeds: 50 mg Benadryl IV, 650 mg Tylenol po, and 125 mg Solumedrol IV  - most recent prior infusion was in August 2019    [facility]:  07/16/20 labs: Normal CBC, except mild anemia 12.4 hgb. Normal PT/INR, CMP,     [facility] notes:  Most recent MR brain is in 07/06/16, 01/2015 and 08/2013 (no reports available), MR T-spine from 08/14/19, and MR C-spine was in 09/16/13. We have no reports in Care Everywhere (storage only at [facility]).  MA has requested reports and images.      Prior Botox regimen from  notes most recent on 04/26/2018:  Last injection worked well but wore off after 2.5 months. But now with severe spasticity has developed in both LE including adductors    PROCEDURE: Incobotulinum (Botulinum toxin) A injection. The risks and benefits were discussed with patient and informed consent was obtained. Incobotulinumtoxin A was diluted at 2:1 in a sterile fashion with 0.9% preservative-free normal saline.  Utilized universal procedure for time-out. Utilized universal procedure for time-out   After consulting with Dr Roy Henry the following muscle groups were treated:  1. R gastrocnemius: 2 injections 25untis each for a total of 50units.   2. L vastus medialis, rectus femoris: 8 injections x 25 units/each= 150 units   3. R vastus medialis, rectus femoris: 8 injections x 25 units/each= 150 units   4. L gastrocnemius 2 inj x 25 units = 50units   5. R adductor magnus 4  injections 25 units for a total =100 units  6. L adductor magnus 4 injections 25 units for a total =100 units    Summated, this patient received a total of 600units of incobotulinumtoxin. There were no complications and no unavoidable waste.      Exam - see above. LEs can be minimally extended with locked hip flexion / adduction, knee flexion and ankle dorsiflexion. Creating a crossed legs deformity. Unclear if there is contracture.     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 He was previously on rituximab that was held after the dose in 02/2018 due to transportation problems and COVID issues. I agree with restarting this to preserve his remaining functioning. I also would be more aggressive with B-cell depletion by giving a second dose of rituximab. He had dalfampridine d/c'ed by the nursing facility in transfer. This may be difficult to get approved since he is not ambulatory at this time.     His main reason for referral is spasticity treatment. Agree with baclofen pump evaluation. This will be easier for him to arrange at [facility] compared to  in terms of transportation. I can refer to PM&R for baclofen pump eval / placement. I also will refer to neurology for botox injections for spasticity. Maximize oral medications if tolerated in terms of sedation. 20 mg baclofen qid and increase diazepam to 5 mg qid on a routine dosing schedule.     1. Obtain additional MRI records / reports for completeness.  2. Order a second dose of rituximab for induction (1000 mg days 1 and 15, first dose given on 07/16/20, so next dose is due on 08/01/19). Then continue rituximab at 1000 mg x 1 every 6 months.   3.  Recommend more aggressive spasticity management:  - increase valium to 5 mg 4 times daily as tolerated.  - increase baclofen to 20 mg 4 times daily as tolerated.   - referral placed to PM&R for baclofen pump eval and placement   - referral placed to neurology Botox clinic for spasticity management and contracture prevention  4. PT for passive ROM, timing after the diazepam dosing  5. If above regimens are unhelpful and permanent contractures have formed, then we may need to consider surgical options for hygiene and prevention of wounds.    Follow up in three months.     Total time I spent in care of this patient today (excluding time spent on other billable services) was 75 minutes. Extended time spent reviewing outside  records that were not included with the referral.     Reymundo Poll, MD, PhD  MS Clinic Director  Health Sciences Clinical Professor of Neurology  [facility]

## 2020-07-22 ENCOUNTER — Other Ambulatory Visit: Payer: Self-pay | Admitting: Neurology

## 2020-07-22 ENCOUNTER — Telehealth: Payer: Self-pay | Admitting: Surgery

## 2020-07-22 NOTE — Telephone Encounter (Addendum)
Received imaging from [facility] and archived.     MRI C-Spine performed on 09/2013 was submitted to [facility] to be archived.    **Per Ronalee Belts at [facility] patient never had imaging past 2015. Images needed were performed at [facility]    E. Renard Hamper, [facility]

## 2020-07-22 NOTE — Addendum Note (Signed)
Addended by: Shellia Cleverly on: 07/22/2020 05:14 PM     Modules accepted: Orders

## 2020-07-22 NOTE — Telephone Encounter (Signed)
ct 

## 2020-07-22 NOTE — Telephone Encounter (Signed)
Left message requesting that patient call to schedule an appointment with Dr. Charleston Ropes for poss preop.    Cleda Daub MA

## 2020-07-22 NOTE — Telephone Encounter (Addendum)
**  Fax was sent to [facility] at [phone], along with a [employer] Shipping Label requesting MRI Images/CT Scan to be mailed to [facility].    [employer] reference number: [id number]  [employer] tracking number: [id number]    * Will submit request to [facility] for Images to be archived once they've been received and uploaded.      Sander Radon, [facility]

## 2020-07-22 NOTE — Telephone Encounter (Signed)
Phone call from pt, Appt made 1/12 at 840am for a pre-op with Lyo.     [facility]  [phone]

## 2020-07-29 ENCOUNTER — Ambulatory Visit: Payer: Medicare Other | Attending: Surgery | Admitting: Surgery

## 2020-07-29 VITALS — BP 136/82 | HR 80 | Temp 98.2°F

## 2020-07-29 DIAGNOSIS — K432 Incisional hernia without obstruction or gangrene: Secondary | ICD-10-CM | POA: Insufficient documentation

## 2020-07-29 DIAGNOSIS — R1031 Right lower quadrant pain: Secondary | ICD-10-CM | POA: Insufficient documentation

## 2020-07-29 NOTE — Nursing Note (Signed)
Patient WAS wearing a surgical mask  Contact precautions were followed when caring for the patient.   PPE used by provider during encounter: Surgical mask    Blood pressure 136/82, pulse 80, temperature 36.8 C (98.2 F), temperature source Temporal, SpO2 99 %.    Patient is gurney bound, height and weight not done    Patient's name and DOB verified. Vital signs taken, screened for pain, allergies and pharmacy verified.     Cleda Daub, [place]

## 2020-07-29 NOTE — Nursing Note (Signed)
Pre-op folder containing the following contents given and discussed:  Preoperative Instructions and Information, No Aspirin or Ibuprofen, What You Should Know About Herbal Medications, Important Information For the Surgical Patient, Clear Liquid Diet Bowel Prep For Adults Undergoing Elective Scheduled Surgery, Fasting Guidelines for Adults Undergoing Elective Scheduled Surgery, Maps of [facility], [facility], [facility] and Information For Patients Signs Of Infection. Patient instructed to begin clear liquids on the day before her/his scheduled surgery. Instructed to stop all clear liquids 4 hours surgery time.     Randel Hargens MAII

## 2020-07-29 NOTE — Progress Notes (Signed)
Foregut, Metabolic, and General Surgery Faculty Practice  Follow Up Patient Visit      DATE OF VISIT:  07/29/2020    REFERRING PROVIDER:  Graylon Good, MD  PCP: Patient, No Pcp Per    HISTORY OF PRESENT ILLNESS:  Roy Henry is a(n) 74yr year-old male patient seen in consultation requested by Graylon Good, MD  for the above chief complaint. Roy Henry has a history of multiple sclerosis. He developed a small bowel obstruction on 02/09/20 and underwent emergent ex lap with resection of small bowel. 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 He was discharged on 02/26/20 and has been at [facility] post acute care.    In November he was rolling over and felt a pop in his abdomen. He then noticed two bulges. He has had CT scan demonstrating incisional hernia and was previously evaluated for this in clinic on 06/24/20.    At that time he was working on establishing care with neurology here at [facility]. He has since seen neurology and started on rituximab infusion (started 07/16/20 and plan for next infusion now but he has not scheduled it, then q60mo). Additionally he had valium increased to 5mg  4 times a day, baclofen 20mg  4 times a day. He states since then, his spasticity and mobility of his lower legs have improved noticeably.    Since he was last seen in clinic his constipation has improved and he now has BM once a day. He has had persistent pain, mostly in his right lower abdomen that is sharp. No fevers or chills.  No dysuria or hematuria.    We reviewed the patient's radiology studies, which included an CT scan from October 2021 showing his large midline incisional hernias. In addition he has suspected thrombosis of right femoral artery. It appears he also has a right inguinal hernia. He reports that he had an additional CT A/P on Dec 30th 2021.    He also states he has chronic pain from his  spasms and from his hernia, and used to take [place] 10/325 every 4-6hr; now at rehab he is struggling to get his meds on time, written as Norco 5/325mg  q4hr PRN and has been taking it at least 3 times a day and feels he needs it q4hr.     RISK FACTORS FOR HERNIA:  Smoking: no  GI or GU Issues:  improving constipation  Prior Hernia Repairs: no  Hx of Open Abdomen:  no  Hx of Abdominal Wall Surgical Site Infection:  yes   Location (Superficial SSI, Deep SSI, Organ Space SSI):  organ space   Prior Prosthetic Mesh Infection:  no   Hx MRSA Infection:  no   Currently Active Infection:  no  Immune Suppression: yes   Type:  rituximab  COPD: no  Occupation requiring weight lifting: no  Bleeding Disorders: no  Diabetes: no,  Morbid Obesity: no  Malnutrition: no  History of Infection: yes  Post-Op Complications on Previous Surgery: yes  Normal Physical Activity:  no limited by MS  Functional Status Totally dependent  Nicotine Use:  none  Dialysis:  no  Anti-Platelet Medications:  no  Anti-Coagulation Medications:  no    ----------    Past Medical History:   Diagnosis Date    GERD (gastroesophageal reflux disease)     Hypertension     Immobility     bed/wheelchair bound  Multiple sclerosis ([place])         Past Surgical History:   Procedure Laterality Date    EXPLORATORY LAPAROTOMY  02/09/2020    SB resection and appendectomy for SBO, complicated by sepsis, SMV thrombosis    EXPLORATORY LAPAROTOMY      SB resection 2nd time       Patient's Medications   New Prescriptions    No medications on file   Previous Medications    AMLODIPINE (NORVASC) 10 MG TABLET    Take 10 mg by mouth every day.    ASPIRIN (ASPIR-81) 81 MG EC TABLET    Take 1 tablet by mouth every morning.    BACLOFEN (LIORESAL) 10 MG TABLET    Take 2 tablets by mouth 4 times daily.    DIAZEPAM (VALIUM) 5 MG TABLET    Take 1 tablet by mouth every 6 hours if needed (spasticity).    DOCUSATE (COLACE) 100 MG CAPSULE    Take 100 mg by mouth 2 times daily.     HYDROCODONE-ACETAMINOPHEN (NORCO) 5-325 MG TABLET    Take 1 tablet by mouth every 4 hours if needed.    PANTOPRAZOLE (PROTONIX) 40 MG DELAYED RELEASE TABLET    Take 40 mg by mouth every morning before a meal.    POLYETHYLENE GLYCOL 3350 (MIRALAX) 17 GRAM/DOSE POWDER    Take 17 g by mouth every morning. Mix in 4 to 8 oz of water, soda, coffee, juice or tea.    SENNOSIDES (SENNA) 8.6 MG CAPSULE    Take 1 capsule by mouth 2 times daily.    VITAMIN D PO    Take 1,000 Units by mouth every morning.   Modified Medications    No medications on file   Discontinued Medications    No medications on file        Allergies:    Tegretol [Carbamazepine]    Unknown-Explain in Comments    Comment:unknown    No family history on file.     Social History     Socioeconomic History    Marital status: SINGLE     Spouse name: Not on file    Number of children: Not on file    Years of education: Not on file    Highest education level: Not on file   Occupational History    Not on file   Tobacco Use    Smoking status: Never Smoker    Smokeless tobacco: Never Used   Substance and Sexual Activity    Alcohol use: Not on file    Drug use: Not on file    Sexual activity: Not on file   Other Topics Concern    Not on file   Social History Narrative    Not on file     Social Determinants of Health     Financial Resource Strain: Not on file   Food Insecurity: Not on file   Transportation Needs: Not on file   Physical Activity: Not on file   Stress: Not on file   Social Connections: Not on file   Intimate Partner Violence: Not on file   Housing Stability: Not on file       REVIEW OF SYSTEMS:    Constitutional: negative.No fever chills, No weight loss, fatigue, night sweats   Eyes: negative. No red eyes, poor vision, discharge  Ears, Nose, Mouth, Throat: negative.no hearing loss, runny nose, sore throat, no dentures, toothache  Neuro: multiple sclerosis, bilateral lower extremity contractures; No frequent headache, blackout,  memory loss  CV:  negative. no chest pain, angina;  no PND, MI  Resp: negative. no asthma, COPD, emphysema, no frequent coughing  GI: negative. Constipation improved, no stomach pain, ulcers, liver disease; no dysphagia, no frequent nausea/vomiting  GU: negative. No difficulty urinating, no frequent night time urination  Musculoskeletal: negative.no bone pain, weakness  Skin negative. no jaundice, itch, rash  Heme: history of SMV thrombosis. no easy bleeding/bruising  Endocrine: negative. frequent flushing, hot or cold intolerance    PHYSICAL EXAMINATION:    BP 136/82 (SITE: left arm, Orthostatic Position: sitting, Cuff Size: regular)   Pulse 80   Temp 36.8 C (98.2 F) (Temporal)   SpO2 99%     GENERAL:  lying in stretcher, severe contractures of bilateral lower extremitiies  HEENT: Grossly within normal limits.  RESP: breathing unlabored on RA  CARDIOVASCULAR:  Extremities warm and well perfused.  ABDOMEN:   two large midline fascial defects, soft and reducible  GROINS/GU: right groin pain along medial edge of right lower rectus muscle; no obvious groin/inguinal bulge but limited exam due to leg contracutres  EXTREMITIES:  No edema, full range of motion.    DVT US 07/01/20  RIGHT -  The common femoral vein is patent with spontaneous, phasic Doppler flow signals.  The mid femoral   vein is patent with color flow Doppler.  The peroneal veins are patent with color flow Doppler.     LEFT -  The mid and distal femoral vein is patent with color flow Doppler.     Unable to evaluate the remainder of deep veins of both lower extremities.        IMPRESSION:     1. VERY LIMITED EXAM DUE TO THE CONTRACTED NATURE OF THE PATIENTS LIMBS     2. NEGATIVE FOR DEEP VEIN THROMBOSIS, BOTH LOWER EXTREMITIES WHERE VISUALIZED.    IMPRESSION:  This is a 15yr year-old man with a symptomatic incisional hernia, and likely symptomatic right inguinal hernia who would benefit from elective repair. It is unclear whether his RLQ pain is from his incisional  hernia, unrelated to the hernia, or from his right inguinal hernia that appears on his CT scan.     PLAN:     Based on review of her exam and imaging Roy Henry is a candidate for open ventral hernia repair with mesh and possible abdominal wall reconstruction using transversus abdominis muscle release (TAR). Given his groin pain, he may benefit froom right inguinal hernia repair with mesh. He has contractures of his bilateral lower extremities which can make repair of his inguinal hernia more complicated.    He is on rituximab for his multiple sclerosis and will require discussion with neurology to determine optimal timing of his repair in relation to this medication. Additionally, if he will have improvement in his contractures after his 2nd dose of medication, we would want to delay surgery so that it's optimized, further helping with the technical aspects of his surgery because his hips/legs are so abducted that it makes seeing the lower abdomen difficult.    Prior to surgery, we reviewed relevant factors to hernia repair, complications, and risk of hernia recurrence  1. Using the CeDAR hernia risk calculator, his risk of perioperative complications is:  9%.  We discussed the need for lifestyle modifications in order to decrease his overall risks.  2. We discussed various surgical options and alternative therapy such as watchful observation and use of an abdominal binder were also informed together with the pros and  cons involved, in particular the incidence of complications related to the hernia. He would like to proceed with open repair of his incisional hernia with possible TAR. Given his right groin pain and the challenges of repair of the groin hernia with his contractures, he will also have potential repair of right inguinal hernia.  3. Will need to review CT obtained on 07/16/20. Request for CD of this image sent.  4. Will need to discuss timing of repair with neurology  5. Will need overread of prior CT to  further evaluate for right inguinal hernia (order placed) and to evaluate if he has other SMV thromboses  6. I counseled him regarding the symptoms of incarceration and strangulation, including worsening pain, bulging that cannot be reduced, skin changes such as redness, fevers, inability to tolerate food, and/or limited bowel function.  The patient understands the need to be seen by a medical professional or come to the ER if they have persistent symptoms.    A detailed discussion was held describing the proposed procedure. Risks, including but not limited to death, organ failure, need for ICU care, heart attack, stroke, need for mechanical ventilation, DVT-PE, bleeding, infections (including abscesses, wound infections, urinary tract infections, pneumonia, and mesh infection), injury to surrounding structures and possible need for reoperation were discussed. The patient was informed of the risks of blood transfusions should it be needed. Long term risks of recurrent hernia and small bowel obstruction were reviewed. All the patient's questions were answered, and he has provided informed consent and signed the form.    The patient has decided to proceed with incisional Ventral hernia hernia with mesh, possible TAR, possible right inguinal hernia repair with mesh.    Preop instructions given; will arrange timing after discussion with neurology.    Also recommended increasing his Norco to address his pain.    Almyra Brace, MD  General Surgery PGY6

## 2020-07-29 NOTE — Progress Notes (Signed)
Attending Addendum  I saw [name] for follow-up of his incisional hernia with Dr. Johnnette Litter, resident.     I  have seen and examined the patient. I agree with the findings and the plan of care as documented in the resident's note, and addendums were made as appropriate.     Details of our jointly conducted and developed E&M as outlined above, which were reviewed, verified and edited.    All of his questions are answered.  He verbalizes his understanding of the topics discussed without barrier to learning.    Thank you for the referral. We value collaboration with our patient's primary care providers.  Please do not hesitate to contact us with any questions or concerns.    Report Electronically Signed by:    Shellia Cleverly, MD, MTM  Division of Foregut, Metabolic, and General Surgery   [facility]   [address] [facility] Phone  [phone]    Total time I spent in care of this patient today (excluding time spent on other billable services) was 60 minutes.

## 2020-07-31 ENCOUNTER — Telehealth: Payer: Self-pay | Admitting: Neurology

## 2020-07-31 NOTE — Telephone Encounter (Signed)
Received a call from [facility] requesting to get in touch with a nurse so they can be updated on patients treatment. Best point of contact is Zack and best call back number [phone].

## 2020-08-04 ENCOUNTER — Other Ambulatory Visit: Payer: Self-pay | Admitting: Neurology

## 2020-08-04 NOTE — Telephone Encounter (Signed)
Received imaging on disc from [facility]. Images were uploaded and archived.     MRI Brain performed on 07/06/2016 was submitted to [facility] to be archived.    MRI T-Spine performed on 08/14/2015 was submitted to [facility] to be archived.    MRI C-Spine performed on 07/15/2015 was submitted to [facility] to be archived.    Sander Radon, [facility]

## 2020-08-06 ENCOUNTER — Encounter: Payer: Self-pay | Admitting: Surgery

## 2020-08-07 ENCOUNTER — Telehealth: Payer: Self-pay | Admitting: Surgery

## 2020-08-07 ENCOUNTER — Other Ambulatory Visit: Payer: Self-pay

## 2020-08-07 DIAGNOSIS — K432 Incisional hernia without obstruction or gangrene: Secondary | ICD-10-CM

## 2020-08-07 LAB — CBC WITH DIFFERENTIAL
Basophils % Auto: 0.1 %
Basophils Abs Auto: 0 10*3/uL (ref 0.0–0.2)
Eosinophils % Auto: 0.2 %
Eosinophils Abs Auto: 0 10*3/uL (ref 0.0–0.5)
Hematocrit: 34.9 % — ABNORMAL LOW (ref 41.0–53.0)
Hemoglobin: 11.8 g/dL — ABNORMAL LOW (ref 13.5–17.5)
Lymphocytes % Auto: 15 %
Lymphocytes Abs Auto: 0.9 10*3/uL — ABNORMAL LOW (ref 1.0–4.8)
MCH: 30.8 pg (ref 27.0–33.0)
MCHC: 33.8 % (ref 32.0–36.0)
MCV: 91.1 fL (ref 80.0–100.0)
MPV: 9 fL (ref 6.8–10.0)
Monocytes % Auto: 1.5 %
Monocytes Abs Auto: 0.1 10*3/uL (ref 0.1–0.8)
Neutrophils % Auto: 83.2 %
Neutrophils Abs Auto: 5.2 10*3/uL (ref 1.8–7.7)
Platelet Count: 266 10*3/uL (ref 130–400)
RDW: 13.7 % (ref 0.0–14.7)
Red Blood Cell Count: 3.83 10*6/uL — ABNORMAL LOW (ref 4.50–5.90)
White Blood Cell Count: 6.3 10*3/uL (ref 4.5–11.0)

## 2020-08-07 NOTE — Telephone Encounter (Signed)
I spoke with Roy Henry, and he started having sx of COVID around 1/18, and tested positive 08/06/20 (mild symptoms, not hospitalized). He's currently in quarantine at another site in [place], separate from his usual rehab center. He states it would take 11mo at least to get his next rituximab infusion, and he does not want to wait that long to get it and further delay his surgery.    He would like to proceed with surgery and delay his rituximab. I discussed waiting at least 6 weeks after covid. Will estimate/plan for early March, likely 09/21/20.    Dr. Charleston Ropes

## 2020-08-27 ENCOUNTER — Encounter: Payer: Self-pay | Admitting: Neurology

## 2020-08-27 NOTE — Progress Notes (Signed)
Received Images:    MR brain 09/09/13:    Note these MRIs are very poor quality, large sections through brain, not comprehensive.              MR C-spine 07/15/2015:

## 2020-09-16 ENCOUNTER — Encounter: Payer: Self-pay | Admitting: Surgery

## 2020-09-16 NOTE — Anesthesia Preprocedure Evaluation (Addendum)
Anesthesia Evaluation      Medical History, Review of Systems, and Physical Exam    ROS includes input from [facility] Nurse  HPI: 50yr male with Incisional hernia, presenting with the following Procedure(s):  REPAIR, HERNIA, INCISIONAL (N/A)  INSERTION, MESH, ABDOMINAL WALL, ANTERIOR (N/A)  REPAIR, HERNIA, INGUINAL, UNILATERAL (Right)    Scheduled Surgery Date: 09/21/2020  Surgeon(s): Shellia Cleverly, MD    PMH: HTN, Small bowel obstruction, s/p Ex lap in 7/21, c/b sepsis, SMV thrombosis,  Multiple sclerosis with lower extremity contractures,  Covid test positive 08/04/20           Lab Name                 Value               Date/Time                  WBC                      6.3                 08/07/2020 02:00 PM        HGB                      11.8 (L)            08/07/2020 02:00 PM        HCT                      34.9 (L)            08/07/2020 02:00 PM        PLT                      266                 08/07/2020 02:00 PM     No history of anesthetic complications  No family history of anesthetic complications.  Additional Assessment and Optimization notes:   Pt tested covid positive on 08/04/20 and [facility] in [place] [facility] will fax positive Covid test result to [phone]    COVID not detected 09/18/20    Airway   Mallampati: III  TM distance: >3 FB     Neck ROM is full.     Dental    (+) poor dentition, missing and chipped       Pulmonary - normal exam  (-) no shortness of breath, recent URI, sleep apnea    Patient's breath sounds clear to auscultation. Cardiovascular - cardiovascular exam normal  (+) hypertension (well controlled),    (-) angina    Rhythm: regular  Rate: normal  Patient has poor exercise tolerance. (Wheelchair bound d/t multiple sclerosis)   Neuro/Psych    (+) neuromuscular disease ( Multiple sclerosis),     Neuro/Psych ROS comment: Wheelchair bound d/t multiple sclerosis    MS tx:  rituximab infusion (started 07/16/20 and plan for next infusion now but he has not scheduled it, then q28mo).  Additionally he had valium increased to 5mg  4 times a day, baclofen 20mg  4 times a day    Chronic pain:  chronic pain from his spasms and from his hernia, and used to take [place] 10/325 every 4-6hr; now at rehab he is struggling to get his meds on time, written as Norco 5/325mg  q4hr PRN and has been  taking it at least 3 times a day and feels he needs it q4hr.  GI/Hepatic/Renal   (+) GERD (well controlled),     Comments: Surgical course leading to hernia:  developed a small bowel obstruction on 02/09/20 and underwent emergent ex lap with resection of small bowel. 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 He was discharged on 02/26/20 and has been at [facility] post acute care.    Abdominal    Endo/Other    (-) diabetes  Smoking History  No history of smoking                   Medications reviewed:   Outpatient Medications Marked as Taking for the 09/21/20 encounter [facility] Encounter)   Medication Sig Dispense Refill    Amlodipine (NORVASC) 10 mg Tablet Take 10 mg by mouth every day.      Aspirin 81 mg EC Tablet Take 1 tablet by mouth every morning.      Baclofen (LIORESAL) 10 mg Tablet Take 2 tablets by mouth 4 times daily.      Diazepam (VALIUM) 5 mg Tablet Take 1 tablet by mouth every 6 hours if needed (spasticity). 30 tablet     Hydrocodone-Acetaminophen (NORCO) 5-325 mg Tablet Take 1 tablet by mouth every 4 hours if needed.      onabotulinumtoxinA (BOTOX INJ) 0 Refill(s), Maintenance      Pantoprazole (PROTONIX) 40 mg Delayed Release Tablet Take 40 mg by mouth every morning before a meal.      Polyethylene Glycol 3350 (MIRALAX) 17 gram/dose Powder Take 17 g by mouth every morning. Mix in 4 to 8 oz of water, soda, coffee, juice or tea.      Sennosides (SENNA) 8.6 mg Capsule Take 1 capsule by mouth 2 times daily.      VITAMIN D PO Take 1,000 Units by mouth every morning.       Patient Instructions:      NPO/NSAID/herbal and nutritional supplement guidelines per your surgery clinic.  Medications on DOS:  Amlodipine, Baclofen, Valium, Norco, Protonix    Aspirin preop instruction deferred to surgeon, note routed to Dr Charleston Ropes      Infectious Disease Screen: ( Questionnaire updated 12/20/19)    1. In the last 2 weeks have you tested positive for the COVID-19 virus or been diagnosed with COVID-19? No. Pt tested positive on 08/04/20    2. In the last 2 weeks, have you had close contact with someone who is  sick from COVID-19? no    3.  Over the past 2 weeks, have you had  new symptoms not related to chronic illness of the following signs/symptoms: Fever >100 F(37.8 C), chills/repeated shaking with chills, muscle pain, headache, abdominal pain, diarrhea, Nausea, vomiting,rash, loss of taste/smell,or current respiratory symptoms such as cough and shortness of breath? no      We will allow 1 consistent adult visitor  on the appointment date. Per hospital mandate, visitors who are not fully vaccinated will need to provide proof of a negative test taken within 24 hours prior to the visit (if an antigen test), 48 hours prior (if a PCR test), or 72 hours if they are a repeat visitor who has already provided one of these other tests at their prior visit.  The definition of being 'fully vaccinated' for the [facility] visiting policy is either proof of three doses of vaccine manufactured by [employer] or  Modern[employer], or two doses of vaccine if one was from [employer].    All visitors must consistently wear a mask while in the hospital zone. Visitors will not be allowed if they have signs and symptoms of illness, including cough, runny nose, sneezing, fever or sore throat, or have been around someone with COVID- 19. Children less than 86 years old will not be allowed in the hospital premises per hospital policy. Visiting hours are between 9 a.m. and 9 p.m.    Patient verbalized understanding.                Anesthesia Plan    ASA 3     general   (- Avoid succinylcholine due to MS and concern for upregulation of nACH receptors  - consent for possible postop truncal block  - chronic pain on chronic narcotics - will utilize ketamine (0.5mg /kg on induction based on IBW with goal of 1-1.5mg /kg for the case)  - 2 large bore IVs )  Intravenous induction    Postoperative administration of opioids is intended.  Trial extubation is planned.    Anesthetic plan and risks discussed with patient.  Resident/Fellow/CRNA discussed the plan with the attending.    I performed the pre-anesthetic exam and prescribed the anesthesia plan.  I reviewed the available note and agree with the documentation.

## 2020-09-17 ENCOUNTER — Telehealth: Payer: Self-pay | Admitting: Surgery

## 2020-09-17 NOTE — Nurse Focus (Addendum)
Fax pre op instructions to [employer] [phone]    I spoke with LVN Stacey in [place], pt will arrive on [place]er[place] via ED.                   Preoperative Phone Call Documentation    Scheduled Surgery Date: 09/21/2020  Proposed Surgery: Procedure(s):  REPAIR, HERNIA, INCISIONAL (N/A)  INSERTION, MESH, ABDOMINAL WALL, ANTERIOR (N/A)  REPAIR, HERNIA, INGUINAL, UNILATERAL (Right)  Pre-op Dx: Incisional hernia, without obstruction or gangrene [K43.2]  Surgeon: Surgeon(s):  Shellia Cleverly, MD      Verification of planned surgery times  Surgery Date/Time Verified: Yes (DOS 3/7 at 0730)  Arrival Time Verified: 0530  Surgery Check-In Location Verified: [facility] OR    Fasting Instructions:  Time of Last Clear Liquids: 0530  Date of Last Clear Liquids: 09/21/20  Time of Last Solids : 2200  Date of Last Solids: 09/20/20    Post surgical discharge information   Person to pick up pt (if different than support person): medical transport from [facility] post acute care      Patient was provided with phone number [phone] if there are changes to their health or if new symptoms arise that may potentially cancel the case.    Instructions for Day of Surgery      1. Bring a valid photo  ID.  2. Do not smoke or drink alcohol.  3. DO NOT bring valuables  including jewelry and body piercing with you on the day of surgery.  4. Take a bath or shower on the morning of surgery.     5. DO NOT apply cosmetics, deodorant,creams or lotion after bathing.  6. Wear comfortable clothing that either zips or buttons in front.  7. Arrange for transportation to bring you home after your surgery and someone to stay with at home.  Your surgery will be cancelled if you do not have adequate transportation.  8. Bring your CPAP machine for sleep apnea if applicable.    Fasting Guidelines for Adults      1. NO solid food including broth and dairy products after the cut off time for solid food (See above documentation).   2. On the Morning of Surgery:  Clear  liquids may be continued until 2 hours prior to surgery. See above documentation for cut off time for clear liquids.   Clear liquids include water, apple juice, yellow gatorade, clear carbonated beverages, black coffee or tea without cream, sweeteners, or milk. No alcohol containing beverages.      FIEP Phineas Semen, RN    Nicholes Mango, M.D.  Director - [place]  [phone]

## 2020-09-17 NOTE — Nurse Focus (Signed)
Pt tested covid positive on 08/04/20 and LVN Stacey in [place] will fax positive Covid test result to [phone].  She will also arrange transportation on DOS.     Pt will come on guerney with transport.            Preoperative Phone Call Documentation    Scheduled Surgery Date: 09/21/2020  Proposed Surgery: Procedure(s):  REPAIR, HERNIA, INCISIONAL (N/A)  INSERTION, MESH, ABDOMINAL WALL, ANTERIOR (N/A)  REPAIR, HERNIA, INGUINAL, UNILATERAL (Right)  Pre-op Dx: Incisional hernia, without obstruction or gangrene [K43.2]  Surgeon: Surgeon(s):  Shellia Cleverly, MD    COVID-19 Vaccinations Administered for This Admission     Name Date    COVID-19 mRNA PF (Moderna)  05/20/2020          Infectious Disease Screen: (Questionnaire updated 12/20/19)    1. In the last 2 weeks have you tested positive for the COVID-19 virus or been diagnosed with COVID-19? Yes - 08/04/20    2. In the last 2 weeks, have you had close contact with someone who is  sick from COVID-19? no    3.  Over the past 2 weeks, have you had  new symptoms not related to chronic illness of the following signs/symptoms: Fever >100 F(37.8 C), chills/repeated shaking with chills, muscle pain, headache, abdominal pain, diarrhea, Nausea, vomiting,rash, loss of taste/smell,or current respiratory symptoms such as cough and shortness of breath? no    Notify the surgeon/clinic Schedulers if yes to above questions.     Patient was provided with phone number [phone] if there are changes to their health or if new symptoms arise that may potentially cancel the case.      LVN  Erline Levine confirmed receipt of pre op instructions     Verification of planned surgery times  Surgery Date/Time Verified: Yes (DOS 3/7 at 0730)  Arrival Time Verified: 0530  Surgery Check-In Location Verified: [facility] OR    Fasting Instructions:  Time of Last Clear Liquids: 0530  Date of Last Clear Liquids: 09/21/20  Time of Last Solids : 2200  Date of Last Solids: 09/20/20     Post surgical discharge  information   Person to pick up pt (if different than support person): medical transport from [facility] post acute care    Instructions for Day of Surgery      1. Bring a valid photo  ID.  2. Do not smoke or drink alcohol.  3. DO NOT bring valuables  including jewelry and body piercing with you on the day of surgery.  4. Take a bath or shower on the morning of surgery.     5. DO NOT apply cosmetics, deodorant,creams or lotion after bathing.  6. Wear comfortable clothing that either zips or buttons in front.  7. Arrange for transportation to bring you home after your surgery and someone to stay with at home.  Your surgery will be cancelled if you do not have adequate transportation.  8. Bring your CPAP machine for sleep apnea if applicable.    Fasting Guidelines for Adults      1. NO solid food including broth and dairy products after the cut off time for solid food (See above documentation).   2. On the Morning of Surgery:  Clear liquids may be continued until 2 hours prior to surgery. See above documentation for cut off time for clear liquids.   Clear liquids include water, apple juice, yellow gatorade, clear carbonated beverages, black coffee or tea without cream, sweeteners,  or milk. No alcohol containing beverages.    Location for Check-In on Day of [facility] Surgery at [address], [place], [place] [address]                Check in at information desk in main lobby of hospital and ask for directions to [place] ([place])    For adult patients:      Patient was provided with phone number [phone] if there are changes to their health or if new symptoms arise that may potentially cancel the case.    We will allow 1 consistent adult visitor  on the appointment date. Per hospital mandate, visitors who are not fully vaccinated will need to provide proof of a negative test taken within 24 hours prior to the visit (if an antigen test), 48 hours prior (if a PCR test), or 72 hours if  they are a repeat visitor who has already provided one of these other tests at their prior visit.  The definition of being 'fully vaccinated' for the [facility] visiting policy is either proof of three doses of vaccine manufactured by [employer] or [place], or two doses of vaccine if one was from [employer].    All visitors must consistently wear a mask while in the hospital zone. Visitors will not be allowed if they have signs and symptoms of illness, including cough, runny nose, sneezing, fever or sore throat, or have been around someone with COVID- 19. Children less than 5 years old will not be allowed in the hospital premises per hospital policy. Visiting hours are between 9 a.m. and 9 p.m.          Patient verbalized understanding.      [address] Phineas Semen, RN  09/18/2210:52

## 2020-09-17 NOTE — Telephone Encounter (Signed)
I called [facility] and also his Licensed conveyancer at the facility was there. We reviewed his meds, his last dose of aspirin will be today 09/17/20. All meds in [place] chart are up to date with his facility meds.    Dr Charleston Ropes

## 2020-09-18 ENCOUNTER — Ambulatory Visit: Payer: Medicare Other | Attending: Family Medicine

## 2020-09-18 DIAGNOSIS — Z1152 Encounter for screening for COVID-19: Secondary | ICD-10-CM | POA: Insufficient documentation

## 2020-09-18 DIAGNOSIS — Z20822 Contact with and (suspected) exposure to covid-19: Secondary | ICD-10-CM

## 2020-09-18 LAB — COVID-2019 RNA, QUAL: SARS-CoV-2: NOT DETECTED

## 2020-09-18 NOTE — Nursing Note (Signed)
The following procedure(s) were done per physician's order: COVID-2019 RNA, QUAL testing.      Patient WAS wearing a surgical mask  Contact, Droplet and Airborne precautions were followed when caring for the patient.   PPE used by provider during encounter: Surgical mask, N95 mask, Face Shield/Goggles, Gown and Gloves     Swabbed both nostrils. Swabbed by Armando Lopez-Calva, LVN. Patient tolerated well.     Yoselyn Mcglade, MA ll  Ambulatory Care Float Pool

## 2020-09-20 NOTE — H&P (Signed)
SPECIALTY SURGERY - PRE-OP H&P  Foregut, Metabolic, & General Surgery     DATE OF SURGERY: 09/21/2020  ATTENDING: Dr. Charleston Ropes  RESIDENT: Dr. Mechele Claude    PRE-OPERATIVE HISTORY AND PHYSICAL EXAM     Patient was seen and evaluated pre-operatively. 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 In November 2021, he felt a pop in his abdomen after rolling over. Subsequent CT A/P showed an incisional hernia and he was then referred to surgery clinic. At his clinic visit in January 2022, patient was also noted to have a right femoral artery thrombosis and a symptomatic right inguinal hernia. Given the symptomatic nature of his incisional and inguinal hernias, surgical intervention was recommended. He is scheduled for an open ventral hernia repair with mesh and possible abdominal wall reconstruction using transversus abdominis muscle release (TAR) and possible open right inguinal hernia repair with mesh.    Of note, he is on rituximab infusions for his multiple sclerosis, along with baclofen and valium. He last got his rituximab infusion in December and he was due for one now, but this has been on hold due to surgery (his neurologist is aware). Since we last saw him in clinic, he had Omicron in January which led to a 30 pound weight gain. Otherwise, he denies any changes to his health or the addition of new medications since the last visit to our clinic. Patient reports feeling well. Denies fever, chills, nausea, vomiting, diarrhea, chest pain, shortness of breath, dysuria, or other symptoms of recent illness. Patient denies any recent ED visits, medications changes, or hospitalizations.    He did state he gained 30 lb due to o-micron.     Past Medical History:   Diagnosis Date    GERD (gastroesophageal reflux  disease)     Hypertension     Immobility     bed/wheelchair bound    Multiple sclerosis ([place])      Past Surgical History:   Procedure Laterality Date    EXPLORATORY LAPAROTOMY  02/09/2020    SB resection and appendectomy for SBO, complicated by sepsis, SMV thrombosis    EXPLORATORY LAPAROTOMY      SB resection 2nd time     No current facility-administered medications on file prior to encounter.     Current Outpatient Medications on File Prior to Encounter   Medication Sig Dispense Refill    Amlodipine (NORVASC) 10 mg Tablet Take 10 mg by mouth every day.      Aspirin 81 mg EC Tablet Take 1 tablet by mouth every morning.      Baclofen (LIORESAL) 10 mg Tablet Take 2 tablets by mouth 4 times daily.      Diazepam (VALIUM) 5 mg Tablet Take 1 tablet by mouth every 6 hours if needed (spasticity). 30 tablet     Docusate (COLACE) 100 mg Capsule Take 100 mg by mouth 2 times daily.      Hydrocodone-Acetaminophen (NORCO) 5-325 mg Tablet Take 1 tablet by mouth every 4 hours if needed.      onabotulinumtoxinA (BOTOX INJ) 0 Refill(s), Maintenance      Pantoprazole (PROTONIX) 40 mg Delayed Release Tablet Take 40 mg by mouth every morning before a meal.      Polyethylene Glycol 3350 (MIRALAX) 17 gram/dose Powder Take 17 g by  mouth every morning. Mix in 4 to 8 oz of water, soda, coffee, juice or tea.      Sennosides (SENNA) 8.6 mg Capsule Take 1 capsule by mouth 2 times daily.      VITAMIN D PO Take 1,000 Units by mouth every morning.       Allergies:    Tegretol [Carbamazepine]    Unknown-Explain in Comments    Comment:unknown      REVIEW OF SYSTEMS     Review of Systems   Constitutional: Negative for chills and fever.   HENT: Negative for congestion, sinus pain and sore throat.    Respiratory: Negative for cough, shortness of breath and wheezing.    Cardiovascular: Negative for chest pain and leg swelling.   Gastrointestinal: Negative for blood in stool, nausea and vomiting.   Genitourinary: Negative for hematuria.       VITAL SIGNS     BP (!) 120/94   Pulse 74   Temp 36.6 C (97.9 F)   Resp 18   Ht 1.854 m (6\' 1" )   Wt 75.6 kg (166 lb 10.7 oz)   SpO2 95%   BMI 21.99 kg/m     PHYSICAL EXAM     General: Comfortable. No acute distress.  Neuro:  Alert, oriented, no focal deficits.  Cardiovascular: Regular rate, normal rhythm. Normal S1 and S2  Respiratory: Breathing comfortably on room air. Clear breath sounds bilaterally. No wheezing or crackles.  Abdomen: Soft, non-distended. Well healed midline laparotomy surgical scar with two large midline hernias, reducible, soft, inferior one is mildly tender to palpation with reduction.  GU: right groin tender to palpation, no obvious bulge palpated but lower extremity severely contracted  Extremities: Warm and well perfused, severely contracted BLE.     RECENT LABS AND STUDIES     DVT US 07/01/20  RIGHT -  The common femoral vein is patent with spontaneous, phasic Doppler flow signals. The mid femoral   vein is patent with color flow Doppler. The peroneal veins are patent with color flow Doppler.    LEFT -  The mid and distal femoral vein is patent with color flow Doppler.    Unable to evaluate the remainder of deep veins of both lower extremities.      IMPRESSION:    1. VERY LIMITED EXAM DUE TO THE CONTRACTED NATURE OF THE PATIENTS LIMBS  2. NEGATIVE FOR DEEP VEIN THROMBOSIS, BOTH LOWER EXTREMITIES WHERE VISUALIZED.    CT A/P 05/15/2020:  Reviewed    PRE-OP H&P SUMMARY     Mr. Taussig is a 50 y/o man with history of prior ex lap and small bowel resection x2 now with large midline incisional hernias and a symptomatic right inguinal hernia here for an open ventral hernia repair with mesh, abdominal wall reconstruction with possible transversus abdominis release, and open right inguinal hernia repair with mesh.    The patient was given the opportunity to ask questions.  The patient's recent labs and study results were reviewed.  The patient's pre-operative orders have been  entered.  The patient was consented for surgery, consent was reviewed.  The patient was signed/marked and is ready for surgery.    Anticipated Post-Op Disposition: Admit    Lum Keas, MD  General Surgery Intern  General/Bariatric Surgery [phone]    Surgery Attending Addendum    I saw and examined the patient and agree with the plan as outlined in the Resident's note. We went over the patient's vital signs, I/Os, exam, labs,  and reviewed the medications as documented in the note. I agree with the resident's findings and have developed a management/recommendation plan as documented in the resident's note. Edits and addendums made.    Electronically signed by:  Shellia Cleverly, MD, MTM  Assistant Professor  Division of Foregut, Metabolic, and General Surgery   Pager [phone]

## 2020-09-21 ENCOUNTER — Encounter
Admission: RE | Disposition: A | Discharge status: Skilled Nursing Facility | Payer: Self-pay | Source: Ambulatory Visit | Attending: Surgery

## 2020-09-21 ENCOUNTER — Encounter: Payer: Self-pay | Admitting: Surgery

## 2020-09-21 ENCOUNTER — Inpatient Hospital Stay
Admission: RE | Admit: 2020-09-21 | Discharge: 2020-09-29 | DRG: 353 | Disposition: A | Discharge status: Skilled Nursing Facility | Payer: Medicare Other | Source: Ambulatory Visit | Attending: Surgery | Admitting: Surgery

## 2020-09-21 ENCOUNTER — Ambulatory Visit: Payer: Medicare Other

## 2020-09-21 ENCOUNTER — Ambulatory Visit (HOSPITAL_BASED_OUTPATIENT_CLINIC_OR_DEPARTMENT_OTHER): Payer: Medicare Other

## 2020-09-21 DIAGNOSIS — K432 Incisional hernia without obstruction or gangrene: Secondary | ICD-10-CM | POA: Diagnosis present

## 2020-09-21 DIAGNOSIS — Z86718 Personal history of other venous thrombosis and embolism: Secondary | ICD-10-CM

## 2020-09-21 DIAGNOSIS — R532 Functional quadriplegia: Secondary | ICD-10-CM | POA: Diagnosis present

## 2020-09-21 DIAGNOSIS — I10 Essential (primary) hypertension: Secondary | ICD-10-CM | POA: Diagnosis present

## 2020-09-21 DIAGNOSIS — L304 Erythema intertrigo: Secondary | ICD-10-CM | POA: Diagnosis present

## 2020-09-21 DIAGNOSIS — K567 Ileus, unspecified: Secondary | ICD-10-CM | POA: Diagnosis not present

## 2020-09-21 DIAGNOSIS — Z7401 Bed confinement status: Secondary | ICD-10-CM

## 2020-09-21 DIAGNOSIS — L03311 Cellulitis of abdominal wall: Secondary | ICD-10-CM | POA: Diagnosis not present

## 2020-09-21 DIAGNOSIS — K219 Gastro-esophageal reflux disease without esophagitis: Secondary | ICD-10-CM | POA: Diagnosis present

## 2020-09-21 DIAGNOSIS — K4090 Unilateral inguinal hernia, without obstruction or gangrene, not specified as recurrent: Secondary | ICD-10-CM

## 2020-09-21 DIAGNOSIS — Z20822 Contact with and (suspected) exposure to covid-19: Secondary | ICD-10-CM | POA: Diagnosis present

## 2020-09-21 DIAGNOSIS — Z9049 Acquired absence of other specified parts of digestive tract: Secondary | ICD-10-CM

## 2020-09-21 DIAGNOSIS — G35 Multiple sclerosis: Secondary | ICD-10-CM | POA: Diagnosis present

## 2020-09-21 DIAGNOSIS — Z993 Dependence on wheelchair: Secondary | ICD-10-CM

## 2020-09-21 HISTORY — PX: REPAIR, HERNIA, INCISIONAL: SHX001559

## 2020-09-21 LAB — CBC WITH DIFFERENTIAL
Basophils % Auto: 0.3 %
Basophils Abs Auto: 0 10*3/uL (ref 0.0–0.2)
Eosinophils % Auto: 0 %
Eosinophils Abs Auto: 0 10*3/uL (ref 0.0–0.5)
Hematocrit: 36 % — ABNORMAL LOW (ref 41.0–53.0)
Hemoglobin: 13 g/dL — ABNORMAL LOW (ref 13.5–17.5)
Lymphocytes % Auto: 12.5 %
Lymphocytes Abs Auto: 1.2 10*3/uL (ref 1.0–4.8)
MCH: 33 pg (ref 27.0–33.0)
MCHC: 36.2 % — ABNORMAL HIGH (ref 32.0–36.0)
MCV: 91.2 fL (ref 80.0–100.0)
MPV: 8 fL (ref 6.8–10.0)
Monocytes % Auto: 6.9 %
Monocytes Abs Auto: 0.7 10*3/uL (ref 0.1–0.8)
Neutrophils % Auto: 80.3 %
Neutrophils Abs Auto: 7.9 10*3/uL — ABNORMAL HIGH (ref 1.8–7.7)
Platelet Count: 237 10*3/uL (ref 130–400)
RDW: 13.3 % (ref 0.0–14.7)
Red Blood Cell Count: 3.95 10*6/uL — ABNORMAL LOW (ref 4.50–5.90)
White Blood Cell Count: 9.8 10*3/uL (ref 4.5–11.0)

## 2020-09-21 LAB — BASIC METABOLIC PANEL
Calcium: 8.5 mg/dL — ABNORMAL LOW (ref 8.6–10.0)
Carbon Dioxide Total: 24 mmol/L (ref 22–29)
Chloride: 104 mmol/L (ref 98–107)
Creatinine Serum: 0.54 mg/dL (ref 0.51–1.17)
E-GFR Creatinine (Male): >100 mL/min/{1.73_m2}
Glucose: 124 mg/dL — ABNORMAL HIGH (ref 74–109)
Potassium: 4.3 mmol/L (ref 3.4–5.1)
Sodium: 138 mmol/L (ref 136–145)
Urea Nitrogen, Blood (BUN): 13 mg/dL (ref 6–20)

## 2020-09-21 LAB — PHOSPHORUS (PO4): Phosphorus (PO4): 4.2 mg/dL (ref 2.5–4.5)

## 2020-09-21 LAB — MAGNESIUM (MG): Magnesium (Mg): 1.9 mg/dL (ref 1.6–2.4)

## 2020-09-21 SURGERY — REPAIR, HERNIA, INCISIONAL
Anesthesia: General | Site: Groin | Laterality: Right | Wound class: Clean

## 2020-09-21 MED ORDER — THROMBIN(HUMAN)-FIBRINOGEN-APROTININ SYN-CALCIUM 10 ML TOPICAL SYRINGE
INJECTION | TOPICAL | Status: DC | PRN
Start: 2020-09-21 — End: 2020-09-21

## 2020-09-21 MED ORDER — ONDANSETRON HCL (PF) 4 MG/2 ML INJECTION SOLUTION
INTRAMUSCULAR | Status: DC | PRN
Start: 2020-09-21 — End: 2020-09-21
  Administered 2020-09-21: 4 mg via INTRAVENOUS

## 2020-09-21 MED ORDER — HYDROMORPHONE 2 MG/ML INJECTION SOLUTION
INTRAMUSCULAR | Status: AC
Start: 2020-09-21 — End: ?
  Filled 2020-09-21: qty 1

## 2020-09-21 MED ORDER — LIDOCAINE 5 % TOPICAL PATCH
1.0000 | MEDICATED_PATCH | TOPICAL | Status: DC
Start: 2020-09-21 — End: 2020-09-29
  Administered 2020-09-21 – 2020-09-28 (×8): 1 via TRANSDERMAL
  Filled 2020-09-21 (×8): qty 1

## 2020-09-21 MED ORDER — MORPHINE 2 MG/ML INJECTION SYRINGE
4.0000 mg | INJECTION | Freq: Once | INTRAMUSCULAR | Status: AC
Start: 2020-09-21 — End: 2020-09-21
  Administered 2020-09-21: 4 mg via INTRAVENOUS
  Filled 2020-09-21: qty 1

## 2020-09-21 MED ORDER — HYDROMORPHONE 1 MG/ML INJECTION SYRINGE
0.5000 mg | INJECTION | INTRAMUSCULAR | Status: DC | PRN
Start: 2020-09-21 — End: 2020-09-21

## 2020-09-21 MED ORDER — INTRAOP ROCURONIUM 10 MG/ML INJ 5 ML VIAL
Status: DC | PRN
Start: 2020-09-21 — End: 2020-09-21
  Administered 2020-09-21 (×8): 20 mg via INTRAVENOUS
  Administered 2020-09-21: 80 mg via INTRAVENOUS

## 2020-09-21 MED ORDER — CELLULOSE, OXIDIZED 2" X 3" MISC
Status: AC
Start: 2020-09-21 — End: ?
  Filled 2020-09-21: qty 1

## 2020-09-21 MED ORDER — HYDROMORPHONE 2 MG/ML INJECTION SOLUTION
INTRAMUSCULAR | Status: DC | PRN
Start: 2020-09-21 — End: 2020-09-21
  Administered 2020-09-21: .4 mg via INTRAVENOUS

## 2020-09-21 MED ORDER — FENTANYL (PF) 50 MCG/ML INJECTION SOLUTION
INTRAMUSCULAR | Status: AC
Start: 2020-09-21 — End: ?
  Filled 2020-09-21: qty 5

## 2020-09-21 MED ORDER — LACTATED RINGERS IV INFUSION
INTRAVENOUS | Status: DC
Start: 2020-09-21 — End: 2020-09-21

## 2020-09-21 MED ORDER — MIDAZOLAM (PF) 1 MG/ML INJECTION SOLUTION
INTRAMUSCULAR | Status: DC | PRN
Start: 2020-09-21 — End: 2020-09-21
  Administered 2020-09-21 (×2): 1 mg via INTRAVENOUS

## 2020-09-21 MED ORDER — LIDOCAINE (PF) 10 MG/ML (1 %) INJECTION SOLUTION
0.1000 mL | INTRAMUSCULAR | Status: DC | PRN
Start: 2020-09-21 — End: 2020-09-21

## 2020-09-21 MED ORDER — ACETAMINOPHEN 500 MG TABLET
1000.0000 mg | ORAL_TABLET | Freq: Four times a day (QID) | ORAL | Status: DC
Start: 2020-09-21 — End: 2020-09-24
  Administered 2020-09-21 – 2020-09-24 (×9): 1000 mg via ORAL
  Filled 2020-09-21 (×9): qty 2

## 2020-09-21 MED ORDER — DIAZEPAM 5 MG TABLET
5.0000 mg | ORAL_TABLET | Freq: Four times a day (QID) | ORAL | Status: DC | PRN
  Administered 2020-09-21 – 2020-09-26 (×10): 5 mg via ORAL
  Filled 2020-09-21 (×10): qty 1

## 2020-09-21 MED ORDER — ONDANSETRON HCL (PF) 4 MG/2 ML INJECTION SOLUTION
4.0000 mg | Freq: Three times a day (TID) | INTRAMUSCULAR | Status: DC | PRN
Start: 2020-09-21 — End: 2020-09-29

## 2020-09-21 MED ORDER — CEFAZOLIN 500 MG SOLUTION FOR INJECTION
INTRAMUSCULAR | Status: DC | PRN
Start: 2020-09-21 — End: 2020-09-21
  Administered 2020-09-21 (×2): 2000 mg via INTRAVENOUS

## 2020-09-21 MED ORDER — DOCUSATE SODIUM 100 MG CAPSULE
100.0000 mg | ORAL_CAPSULE | Freq: Two times a day (BID) | ORAL | Status: DC
Start: 2020-09-21 — End: 2020-09-24
  Administered 2020-09-21 – 2020-09-24 (×6): 100 mg via ORAL
  Filled 2020-09-21 (×6): qty 1

## 2020-09-21 MED ORDER — INTRAOP DEXAMETHASONE 4 MG/ML INJ 5 ML VIAL
Status: DC | PRN
Start: 2020-09-21 — End: 2020-09-21
  Administered 2020-09-21: 8 mg via INTRAVENOUS

## 2020-09-21 MED ORDER — KETAMINE 50 MG/ML INJECTION SOLUTION
INTRAMUSCULAR | Status: DC | PRN
Start: 2020-09-21 — End: 2020-09-21
  Administered 2020-09-21 (×3): 10 mg via INTRAVENOUS
  Administered 2020-09-21: 40 mg via INTRAVENOUS
  Administered 2020-09-21 (×3): 10 mg via INTRAVENOUS

## 2020-09-21 MED ORDER — CEFAZOLIN IV PUSH ONCALL TO OR
2.0000 g | INTRAMUSCULAR | Status: DC
Start: 2020-09-21 — End: 2020-09-21

## 2020-09-21 MED ORDER — BUPIVACAINE (PF) 0.5 % (5 MG/ML) INJECTION SOLUTION
INTRAMUSCULAR | Status: AC
Start: 2020-09-21 — End: ?
  Filled 2020-09-21: qty 30

## 2020-09-21 MED ORDER — ACETAMINOPHEN 1,000 MG/100 ML (10 MG/ML) INTRAVENOUS SOLUTION
INTRAVENOUS | Status: DC | PRN
Start: 2020-09-21 — End: 2020-09-21
  Administered 2020-09-21 (×2): 1000 mg via INTRAVENOUS

## 2020-09-21 MED ORDER — INTRAOP PROPOFOL INJ 20 ML VIAL (BOLUS+INFUSION)
Status: DC | PRN
Start: 2020-09-21 — End: 2020-09-21
  Administered 2020-09-21: 150 mg via INTRAVENOUS
  Administered 2020-09-21: 20 mg via INTRAVENOUS

## 2020-09-21 MED ORDER — MIDAZOLAM 1 MG/ML INJECTION SOLUTION
INTRAMUSCULAR | Status: AC
Start: 2020-09-21 — End: ?
  Filled 2020-09-21: qty 2

## 2020-09-21 MED ORDER — SUGAMMADEX 100 MG/ML INTRAVENOUS SOLUTION
INTRAVENOUS | Status: DC | PRN
Start: 2020-09-21 — End: 2020-09-21
  Administered 2020-09-21: 200 mg via INTRAVENOUS

## 2020-09-21 MED ORDER — SENNOSIDES 8.6 MG TABLET
2.0000 | ORAL_TABLET | Freq: Every day | ORAL | Status: DC
Start: 2020-09-21 — End: 2020-09-29
  Administered 2020-09-21 – 2020-09-28 (×7): 17.2 mg via ORAL
  Filled 2020-09-21 (×7): qty 2

## 2020-09-21 MED ORDER — ZINC OXIDE 20 % TOPICAL OINTMENT
TOPICAL_OINTMENT | Freq: Two times a day (BID) | TOPICAL | Status: DC
Start: 2020-09-21 — End: 2020-09-24
  Filled 2020-09-21 (×3): qty 28.35

## 2020-09-21 MED ORDER — ACETAMINOPHEN 1,000 MG/100 ML (10 MG/ML) INTRAVENOUS SOLUTION
INTRAVENOUS | Status: AC
Start: 2020-09-21 — End: ?
  Filled 2020-09-21: qty 100

## 2020-09-21 MED ORDER — KETAMINE 50 MG/ML INJECTION SOLUTION
INTRAMUSCULAR | Status: AC
Start: 2020-09-21 — End: ?
  Filled 2020-09-21: qty 10

## 2020-09-21 MED ORDER — HYDROMORPHONE 1 MG/ML INJECTION SYRINGE
0.2000 mg | INJECTION | INTRAMUSCULAR | Status: DC | PRN
Start: 2020-09-21 — End: 2020-09-21

## 2020-09-21 MED ORDER — INTRAOP FENTANYL 50 MCG/ML INJ 5 ML AMP
Status: DC | PRN
Start: 2020-09-21 — End: 2020-09-21
  Administered 2020-09-21 (×4): 50 ug via INTRAVENOUS
  Administered 2020-09-21: 100 ug via INTRAVENOUS
  Administered 2020-09-21 (×3): 50 ug via INTRAVENOUS

## 2020-09-21 MED ORDER — EPINEPHRINE HCL (PF) 1 MG/ML (1 ML) INJECTION SOLUTION
INTRAMUSCULAR | Status: AC
Start: 2020-09-21 — End: ?
  Filled 2020-09-21: qty 1

## 2020-09-21 MED ORDER — METOCLOPRAMIDE 5 MG/ML INJECTION SOLUTION
10.0000 mg | INTRAMUSCULAR | Status: DC | PRN
Start: 2020-09-21 — End: 2020-09-21

## 2020-09-21 MED ORDER — ZINC OXIDE 20 % TOPICAL OINTMENT
TOPICAL_OINTMENT | Freq: Two times a day (BID) | TOPICAL | Status: DC
Start: 2020-09-21 — End: 2020-09-21

## 2020-09-21 MED ORDER — BACLOFEN 20 MG TABLET
20.0000 mg | ORAL_TABLET | Freq: Four times a day (QID) | ORAL | Status: DC
Start: 2020-09-21 — End: 2020-09-29
  Administered 2020-09-21 – 2020-09-29 (×33): 20 mg via ORAL
  Filled 2020-09-21 (×33): qty 1

## 2020-09-21 MED ORDER — INTRAOP NACL 0.9% 1000 ML IRRIGATION BOTTLE
Status: DC | PRN
Start: 2020-09-21 — End: 2020-09-21
  Administered 2020-09-21 (×3): 1000 mL

## 2020-09-21 MED ORDER — HYDROMORPHONE (PF) 50 MG/50 ML PCA SYRINGE (DISCRETE DOSE) - ADULT
INTRAVENOUS | Status: DC
Start: 2020-09-21 — End: 2020-09-22

## 2020-09-21 MED ORDER — MEPERIDINE (PF) 50 MG/ML INJECTION SYRINGE
12.5000 mg | INJECTION | INTRAMUSCULAR | Status: DC | PRN
Start: 2020-09-21 — End: 2020-09-21

## 2020-09-21 MED ORDER — PROPOFOL 10 MG/ML INTRAVENOUS EMULSION
INTRAVENOUS | Status: AC
Start: 2020-09-21 — End: ?
  Filled 2020-09-21: qty 20

## 2020-09-21 MED ORDER — ONDANSETRON HCL (PF) 4 MG/2 ML INJECTION SOLUTION
4.0000 mg | INTRAMUSCULAR | Status: DC | PRN
Start: 2020-09-21 — End: 2020-09-21

## 2020-09-21 MED ORDER — D5 / 0.45% NACL W KCL 20 MEQ/L IV SOLUTION
75.0000 mL/h | INTRAVENOUS | Status: DC
Start: 2020-09-21 — End: 2020-09-26
  Administered 2020-09-21 – 2020-09-25 (×8): 75 mL/h via INTRAVENOUS

## 2020-09-21 MED ORDER — LIDOCAINE HCL 20 MG/ML (2 %) INJECTION SOLUTION
INTRAMUSCULAR | Status: DC | PRN
Start: 2020-09-21 — End: 2020-09-21
  Administered 2020-09-21: 100 mg via INTRAVENOUS

## 2020-09-21 MED ORDER — HEPARIN (PORCINE) 5,000 UNIT/ML SOLUTION FOR INJECTION
5000.0000 [IU] | Freq: Once | INTRAMUSCULAR | Status: AC
Start: 2020-09-21 — End: 2020-09-21
  Administered 2020-09-21: 07:00:00 5000 [IU] via SUBCUTANEOUS
  Filled 2020-09-21: qty 1

## 2020-09-21 MED ORDER — PANTOPRAZOLE 40 MG TABLET,DELAYED RELEASE
40.0000 mg | DELAYED_RELEASE_TABLET | Freq: Every day | ORAL | Status: DC
Start: 2020-09-22 — End: 2020-09-29
  Administered 2020-09-22 – 2020-09-29 (×7): 40 mg via ORAL
  Filled 2020-09-21 (×7): qty 1

## 2020-09-21 MED ORDER — FENTANYL (PF) 50 MCG/ML INJECTION SOLUTION
50.0000 ug | INTRAMUSCULAR | Status: DC | PRN
Start: 2020-09-21 — End: 2020-09-21
  Administered 2020-09-21 (×2): 50 ug via INTRAVENOUS
  Filled 2020-09-21: qty 2

## 2020-09-21 MED ORDER — HYDROMORPHONE (PF) 50 MG/50 ML PCA SYRINGE (DISCRETE DOSE) - ADULT
INTRAVENOUS | Status: DC
Start: 2020-09-21 — End: 2020-09-21
  Filled 2020-09-21: qty 50

## 2020-09-21 MED ORDER — HYDROMORPHONE 1 MG/ML INJECTION SYRINGE
0.2000 mg | INJECTION | Freq: Once | INTRAMUSCULAR | Status: AC
Start: 2020-09-21 — End: 2020-09-21
  Administered 2020-09-21: 0.2 mg via INTRAVENOUS
  Filled 2020-09-21: qty 1

## 2020-09-21 MED ORDER — NALOXONE 0.4 MG/ML INJECTION SOLUTION: 0.1000 mg | INTRAMUSCULAR | Status: DC | PRN

## 2020-09-21 MED ORDER — POLYETHYLENE GLYCOL 3350 17 GRAM ORAL POWDER PACKET
17.0000 g | Freq: Every day | ORAL | Status: DC
Start: 2020-09-22 — End: 2020-09-29
  Administered 2020-09-22 – 2020-09-29 (×6): 17 g via ORAL
  Filled 2020-09-21 (×6): qty 1

## 2020-09-21 MED ORDER — FENTANYL (PF) 50 MCG/ML INJECTION SOLUTION
25.0000 ug | INTRAMUSCULAR | Status: DC | PRN
Start: 2020-09-21 — End: 2020-09-21

## 2020-09-21 MED ORDER — CHLORHEXIDINE GLUCONATE 0.12 % MOUTHWASH
15.0000 mL | MOUTHWASH | Status: AC
Start: 2020-09-21 — End: 2020-09-21
  Administered 2020-09-21: 07:00:00 15 mL via ORAL
  Filled 2020-09-21: qty 15

## 2020-09-21 SURGICAL SUPPLY — 57 items
BARD MESH DAVOL FLAT SHEET 10 X 14IN 0112660 (Mesh) ×4 IMPLANT
BARD MESH SOFT 12IN X 12IN 0117016 (Mesh) ×4 IMPLANT
BLADE BOVIE TIP 6IN EXTENDED (Blade) ×4 IMPLANT
BLADE KNIFE 11 (Blade) IMPLANT
BLANKET UPPER BODY STRYKER (Blanket) ×4 IMPLANT
CANNISTER GEL 1000ML WOUND VAC (Cannister) ×4 IMPLANT
CATHETER 100P SILICON 16FR UROT1056 (Catheter) ×4 IMPLANT
CAUTERY BOVIE PAD ADULT (Cautery) ×4 IMPLANT
COVER LIGHT HANDLE STERIS (Drape) ×12 IMPLANT
DEVICE PREVENA PLUS 125 THERAPY UNIT (Dressing) ×4 IMPLANT
DRAIN BLAKE 15FR ROUND HUBLESS (Drain) ×4 IMPLANT
DRAIN BLAKE 19FR ROUND HUBLESS (Drain) ×8 IMPLANT
DRAIN PENROSE 3/8 X 12 IN (Drain) IMPLANT
DRAIN SILICONE BULB 100CC 0070740 (Drain) ×12 IMPLANT
DRAPE PAPER TOWEL BLUE DISPOSABLE (Drape) ×12 IMPLANT
DRAPE SHEET LAP ADULT (Drape) ×4 IMPLANT
DRAPE STERI 32 X 17 3M 1050 (Drape) IMPLANT
DRAPE STERI U DRAPE 47.12 X 51 3M 1015 (Drape) ×4 IMPLANT
DRESSING BIOPATCH 1IN DISK 7MM HOLE (Dressing) ×4 IMPLANT
DRESSING DERMABOND ADVANCED TISSUE ADHESIVE (Dressing) ×4 IMPLANT
DRESSING PREVENA PLUS CUSTOMIZABLE (Dressing) ×4 IMPLANT
DRESSING SYSTEM PREVENA 20CM PEEL AND PLACE (Dressing) ×4 IMPLANT
DRESSING TEGADERM 2 X 3IN SMALL (Dressing) ×4 IMPLANT
ELECTRODE BLADE INSULATED 2.75IN (Cautery) ×4 IMPLANT
GLOVES BIOGEL 6 1/2 TOP GLOVE LATEX (Glove) ×4 IMPLANT
GLOVES BIOGEL INDICATOR 7 GREEN UNDERGLOVE LATEX (Glove) ×4 IMPLANT
GOWN AERO BLUE SURGICAL EXTRA LARGE (Gown) IMPLANT
GOWN LARGE AERO BLUE AAMI 3 STANDARD (Gown) ×4 IMPLANT
GRAFT MESH FLAT SHEET 6IN X 6 (Mesh) ×4 IMPLANT
INSTRUMENT POOLE SUCTION TIP (Suction) ×4 IMPLANT
NEEDLE 25 GAUGE X 1 1/2IN (Needle) ×4 IMPLANT
PACK BASIN LATEX SAFE (Pack) ×4 IMPLANT
PAD HEEL PROTECTOR BOOT 9 X 14 X 1.5IN EGGCRATE (Pad) ×20 IMPLANT
PILLOW DISPOSABLE 21 X 27 IN (Pillow) ×4 IMPLANT
PREP CHLORAPREP 26ML WITH TINT ~~LOC~~ (Prep) ×12 IMPLANT
PREP CLEANSING WIPES MEATUS PERINEAL (Prep) ×4 IMPLANT
SCD SLEEVE CALF REG 18IN ALP 1 (Sleeve) ×4 IMPLANT
SEALANT VISTASEAL FIBRIN HUMAN 10ML (Sealant) ×4 IMPLANT
SPONGE GAUZE 4 X 4IN 12PLY STERILE 10 PACK (Dressing) IMPLANT
SPONGE PEANUT 3/8 IN (Sponge) ×16 IMPLANT
STAPLER SKIN 35W (Stapler) ×4 IMPLANT
SUCTION YANKAUER DISP (0034870) (Other) ×4 IMPLANT
SUTURE ETHILON 2-0 FS 18IN BLACK (Suture) ×12 IMPLANT
SUTURE MONOCRYL 4-0 PS-2 18IN UNDYED (Suture) ×4 IMPLANT
SUTURE MONOCRYL 4-0 PS-2 27IN UNDYED (Suture) IMPLANT
SUTURE PDS 0 CT-1 36IN VIOLET (Suture) ×8 IMPLANT
SUTURE PDS 0 CT-2 27IN VIOLET (Suture) ×36 IMPLANT
SUTURE PDS 1 CTX 36IN VIOLET (Suture) IMPLANT
SUTURE PDS 2-0 SH 27IN VIOLET (Suture) ×40 IMPLANT
SUTURE PROLENE 2-0 SH 36IN DOUBLE ARMED BLUE (Suture) ×4 IMPLANT
SUTURE VICRYL 2-0 SH 26IN CONTROL RELEASE 8 PACK VIOLET (Suture) ×8 IMPLANT
SUTURE VICRYL 2-0 SH 27IN UNDYED (Suture) IMPLANT
SUTURE VICRYL 2-0 TIES 18IN 12 PACK UNDYED (Suture) IMPLANT
SUTURE VICRYL 3-0 SH 18IN CONTROL RELEASE 8 PACK UNDYED (Suture) ×4 IMPLANT
SUTURE VICRYL 3-0 SH 27IN UNDYED (Suture) IMPLANT
SUTURE VICRYL 3-0 TIES 18IN 12 PACK UNDYED (Suture) IMPLANT
SYRINGE 12ML WITH LUER LOCK TIP STERILE PACK (Syringe) ×4 IMPLANT

## 2020-09-21 NOTE — Op Note (Addendum)
RESIDENT Full OPERATIVE NOTE  Service [facility] ([facility])  09/21/2020, 15:28    Date of Service: 09/21/2020    Pre-Op Diagnosis: Incisional hernia, without obstruction or gangrene [K43.2] and Right inguinal hernia    Post-Op Diagnosis:  Post-Op Diagnosis Codes:     * Incisional hernia, without obstruction or gangrene [K43.2] and Right inguinal hernia    Procedure Performed/Description:  Procedure(s):  REPAIR, HERNIA, INCISIONAL (N/A)  open Rives stoppa repair, CPT [phone]  INSERTION, MESH, ABDOMINAL WALL, ANTERIOR (N/A)  CPT [phone]  REPAIR, HERNIA, INGUINAL, UNILATERAL (Right)  COMPONENT SEPARATION, ABDOMINAL WALL (Bilateral) transversus abdominis release, CPT 15734 x2    Name of Surgeon and Assistants:  Surgeon(s) and Role:     * Shellia Cleverly, MD - Primary     * Stann Ore, MD - Attending - Assisting     * Asencion Gowda, MD - Resident - Assisting    Type of Anesthesia:  General    ASA Class: ASA 3 - Patient with severe systemic disease    ASA Emergency Status: Non-emergency operation    Wound Classification: Clean    Counts Correct? Yes     DVT Prophylaxis:  SCDs, SQH    Findings:  Large incisional midline hernia repaired with 30.5 cm x 30.5 cm Bard soft mesh (bridged), oriented diagonally so craniocaudal length 43 cm. Right inguinal hernia covered by above mesh with additional 15 x 15 cm Bard mesh with closure of posterior hernia sac. Midline fascia closed superiorly (9cm) and inferiorly 3cm with a 15 x 6cm bridged gap of the anterior rectus sheath despite bilateral TAR. Onlay mesh repair done with Bard Mesh 30 x 15 cm oval with 5cm overlap of fascial edges.      Procedure in detail:   The patient's identity and informed consent were verified in the PACU. In the OR, pneumatic compression stocking device was applied before general endotracheal anesthesia was induced. A foley was inserted, a timeout was performed. His groin area showed significant erythema, foul smell, and evidence of a fungal skin  infection. This was thoroughly cleaned by the nurses. His abdomen and groins were prepped and draped in a sterile manner, with both arms out. Given his significant contractures, extra care was done to ensure all pressure points were padded. He had skin breakdown in his left groin with redness. A midline incision was created sharply from xiphoid to pubic bone (measuring at least 40 cm)     Using a scalpel, the hernia sac near the umbilicus was dissected free and then entered to gain access to the peritoneal cavity sharply. There were minimal adhesions to the intraabdominal contents. The fascia had a swiss cheese appearance with multiple midline hernias with bridging threads of fascia, which were all divided in the midline. The fascial defects spanned approximately 40cm cranio-caudal.      The midline hernia sac was mobilized intact bilaterally to the edge of the rectus abdominus muscles, and taken down so that the hernia sac was in continuity with the posterior rectus sheathes. The retrorectus spaces were entered and mobilized, by separating the posterior rectus sheaths from the bilateral rectus muscles to their lateral border taking care to preserve neurovascular bundles. The retrorectus space was developed cephalad and caudad. Entering the space of retzius.  He additionally had a right inguinal hernia. This myopectineal orifice was dissected down to the iliac vessels and the anterior superior iliac spine on the right in the pre-peritoneal plane and the hernia was reduced. After mobilizing the rectus sheaths,  there was still broad separation with inability to close the midline given the loss of intraabdominal domain in conjunction with his severe contractures which limited mobility of the musculature. Next, the transversus abdominis was released medial to the linea semilunaris to expose a broad plane that extended from the central tendon of the diaphragm superiorly, to the space of Retzius inferiorly, and laterally  to the retro-peritoneum. This was performed bilaterally thus performing a bilateral TAR.      This allowed medialization of the posterior rectus sheath and the linea alba to the midline, leaving no gap along the midline. One small hole in the peritoneum was closed with 2-0 PDS. A combination of the posterior rectus sheath and peritoneum/hernia sac was closed in a running bidirectional fashion using 2-0 PDS. Hemostasis was confirmed.        Next, the hernia defect was reinforced and repaired with a 30.5 cm x 30.5 cm Bard soft mesh, oriented diagonally so craniocaudal length 43 cm and was trimmed to 40 cm in the right-left orientation, which covered the entire retrorectus space. An additional 15 x 15 cm rectangular Bard (non-soft) mesh was inserted into the right iliac fossa, to close the right myopectineal orifice given his right inguinal hernia. It was anchored to the Bard soft mesh with a PDS suture. The Bard mesh was anchored to the xiphoid process using 0-PDS sutures. This allowed complete coverage of the rectus muscles bilaterally and greater than 5-cm overlapping at the top and bottom of the midline closure, all in the retro-rectus space. The rectus muscles was checked for hemostasis. Two 19 Fr Blake drains were placed in the retrorectus space, lateral to the midline. We then closed the anterior rectus sheath using a combination of running and interrupted figure of eight 0 PDS sutures in a bidirectional fashion. However, there was significant tension which precluded completely closing the rectus sheath in the midline. The superior aspect of the rectus sheath was closed 9cm and inferior aspect was closed 3cm, leaving an oval shape 15 cm craniocaudal x 6 cm left-right gap remained. Next, skin flap were raised to expose the anterior rectus sheaths circumferentially at least 5cm from the medial edges of the fascia. An on-lay, bridged repair Bard (non-soft) mesh measuring 30 cm cranio-caudal by 15 cm left-right in  oval shape was placed atop the anterior rectus sheath. The mesh was anchored to the underlying fascial edges with 2-0 PDS sutures (circumferentially along the anterior rectus sheath and laterally circumferentially ensuring a 5cm coverage in all directions.     A 15 Fr Blake drain was placed on top of the onlay mesh. Drains were secured with 2-0 nylon.     The subcutaneous tissue was checked for hemostasis. Scarpas fascia was re-approximated with interrupted 2-0 Vicryl sutures. We then placed interrupted deep dermal sutures using 3-0 Vicryl. The skin was closed with skin staples. A Prevena wound vac was then placed, followed by placement of an abdominal binder.     He awoke from general anesthesia without complications.The attending, Dr. Charleston Ropes, was present and scrubbed for the entire procedure.     Specimens Removed:  None    EBL:  50 ml    Drains:    19 Fr Blake drain x 2, one right and one left in the retrorectus space (R #1, L #2)  15 Fr Blake drain x 2 on the left, on top of the onlay mesh (L #3)    Wound Vac Placed/Replaced: No     Fluids:  2L  IVF    Complications:  None apparent    Outcome: Stable, extubated to PACU    Asencion Gowda, MD Resident    Attending Operative Note Addendum    Operating Room Procedure Presence: I was physically present and scrubbed for the entire operation.    The details of the operative note by Dr. Cherlyn Labella were reviewed, verified, and edited. Edits to the Operative Report were made by myself.   The information contained on this form is true and accurate to the best of my knowledge. Further, I understand that if I misrepresent, falsify or conceal information regarding my participation in the professional service described above, I may be subject to fine, imprisonment, or civil penalty under applicable federal laws.     Potential Modifiers: Any of the Following Modifiers Apply To This Case?    -22 Unusual procedural service (must document what made it unusual) no  -62 Co-Surgery  (both surgeons must separately document their portions ? no   -Lawyer (faculty assist - non Medicare patient) no  Charity fundraiser (faculty assist- Medicare patient, no qualified residents available) no    Are there any other factors about the case that are important for correct coding? no    Report Electronically Signed By:    Shellia Cleverly, MD  Attending    Quality Reporting Capture:  Wound Classification: Clean  Case Type:  Elective  Was the closing protocol followed? (Prior to closing a new drape is placed, contaminated instruments and supplies are removed, gowns and gloves are changed) Yes  Was a wound protector used during surgery? no  Was infection present at time of surgery (Examples:  abscess, cellulitis, erythema, feculent peritonitis, etc.)? Yes, Superficial (skin, subcutaneous tissue) tinea fungal infection in groins

## 2020-09-21 NOTE — Nurse Focus (Signed)
PACU ADMIT NURSING NOTE    Note Started: 09/21/2020, 16:22     Received patient from OR at 1551 hours via bed.  Monitor and Alarms on.  Patient sleepy but arousable. Lyndal Pulley, RN

## 2020-09-21 NOTE — Nurse Assessment (Signed)
Pre-Op Admit Nursing Note  Note started on 09/21/2020 at 05:43    Received patient from as a direct admit at 0535 hours via gurney.  Patient status awake and alert.  Oriented to room and unit.  Admission assessment and care plan initiated.    Nelma Rothman, RN

## 2020-09-21 NOTE — Nurse Assessment (Signed)
ASSESSMENT NOTE    Note Started: 09/21/2020, 19:00     Initial assessment completed and recorded in EMR.  Report received from day shift nurse and orders reviewed. Plan of Care reviewed and appropriate, discussed with patient.  Armando Reichert, RN

## 2020-09-21 NOTE — Progress Notes (Signed)
Bariatric/General Surgery - Post-Operative Check   Attending: Dr. Charleston Ropes   Date/Time of Exam: 09/21/2020 1803        PATIENT ID     NAME: Roy Henry (Age: 51yr, man)  MRN: [id number]    PROCEDURE     s/p open incisional hernia repair with mesh, bilateral transversus abdominus release, right inguinal hernia repair with mesh    SUBJECTIVE     Laying in bed, no acute distress. He reports moderate pain to lower abdomen near groin. Denies nausea or vomiting. Denies chest pain and SOB. Has appetite. No flatus yet. Denies muscle spasm pain.     PHYSICAL EXAM     Recent Vital Signs:  Temp: 36.8 C (98.2 F) (03/07 1600)  Temp src: Temporal (03/07 1600)  Pulse: 80 (03/07 1615)  BP: 113/83 (03/07 1615)  Resp: 12 (03/07 1615)  SpO2: 96 % (03/07 1615)  Height: 185.4 cm (6\' 1" ) (03/07 0543)  Weight: 75.6 kg (166 lb 10.7 oz) (03/07 8832)    Physical Exam:  General: laying in bed, non-toxic appearing  CV: BP at 100/70 (previous 106/81). HR 81.  PULM: Unlabored breathing on room air, no accessory muscle use. Clear breath sounds bilaterally. Diminished respiratory effort.  ABD: Soft, non-distended, mildly tender to bilateral lower quadrants, no guarding, no peritonitic signs. JP drains x3 with light sanguineous output. Prevena dressing to midline incisions. Abdominal binder in place without active strike-through.  GU: Foley in place with minimal light yellow urine  Ext: Severely contracted BLE.     BMP normal  CBC pending    UOP: 50 cc  JP 1: 40 cc  JP 2: 20 cc  JP 3: 40 cc     ASSESSMENT AND PLAN     TIGRAN HAYNIE Henry is doing well s/p above procedure. No acute complications. Pain is somewhat well controlled. Follow up CBC. Encourage IS tonight. Monitor UOP.       Lum Keas, MD  General Surgery Intern  General/Bariatric Surgery [phone]

## 2020-09-21 NOTE — Nurse Transfer Note (Signed)
PACU Transfer Note, RN Accompanying Patient  Note started on 09/21/2020, 17:47    Patient transferred to D12 unit via bed with continuous pulse oximetry at 1730 hours. Bed down, locked, side rails up times 4, and call light in reach. Belongings with with patient. Report in EMR and RN notified of arrival. Assessment unchanged. Lyndal Pulley, RN

## 2020-09-21 NOTE — Nurse Assessment (Signed)
TRANSFER NOTE - RECEIVING    Note Started: 09/21/2020, 17:56     Report received from PACU. Patient received at 1740 hours from PACU unit by bed. Pt condition stable . patient oriented to room and unit. MD notified of patient's arrival on unit.  Plan of care reviewed and updated. Cammy Copa, RN

## 2020-09-21 NOTE — Anesthesia Postprocedure Evaluation (Signed)
Patient: Roy Henry    REPAIR, HERNIA, INCISIONAL  INSERTION, MESH, ABDOMINAL WALL, ANTERIOR  REPAIR, HERNIA, INGUINAL, UNILATERAL  COMPONENT SEPARATION, ABDOMINAL WALL    Anesthesia Type: general    Stop Bang: 2    Vital Signs (Last Recorded):  BP: 113/83 (09/21/20 1615)  Pulse: 80 (09/21/20 1615)  Resp: 12 (09/21/20 1615)  Temp Max: 36.8 C (98.2 F)  (Last 24 hours)  Temp: 36.8 C (98.2 F) (09/21/20 1600)  SpO2: 96 % (09/21/20 1615) on    Device (Oxygen Therapy): nonrebreather mask (09/21/20 1600)      Anesthesia Post Evaluation    Procedure: Procedure(s):REPAIR, HERNIA, INCISIONALINSERTION, MESH, ABDOMINAL WALL, ANTERIORREPAIR, HERNIA, INGUINAL, UNILATERALCOMPONENT SEPARATION, ABDOMINAL WALL  Location: [facility] OR  Anesthesia: General    Patient location during evaluation: PACU  Patient participation: complete - patient participated  Level of consciousness: awake and alert  Pain score: 0  Pain management: satisfactory to patient  Multimodal analgesia pain management approach    Patient did not have a perioperative block    Airway patency: patent  Anesthetic complications: no  Cardiovascular status: blood pressure returned to baseline  Respiratory status: nasal cannula (etCO2 for PCA)  Hydration status: euvolemic  Nausea and Vomiting: absent    Comments: Pt received 148mcg fentanyl.  He is conversive and jovial in PACU, HDS.  Pt received home meds of baclofen and plan to place 5% lidocaine patch to shoulder prior to transfer to floor.  Pt stable to discharge from PACU.                 Elpidio Anis, MD

## 2020-09-21 NOTE — Anesthesia Procedure Notes (Signed)
Airway    Staff    Patient location:  [facility] OR    Performing Resident/CRNA: Zorita Pang, SRNA        Indications and Patient Condition    Spontaneous Ventilation: absent  Sedation level: level 0: deep/analgesia  Preoxygenated: yes  Patient position: sniffing  Mask difficulty assessment: 1 - vent by mask    Final Airway Details    Final airway type: endotracheal airway      Successful airway: cuffed ETT    Successful intubation technique: direct laryngoscopy  Facilitating devices/methods: intubating stylet  Endotracheal tube insertion site: oral  Blade: Macintosh  Blade size: #3  ETT size: 7.5 mm  Cormack-Lehane Classification: grade I - full view of glottis  Placement verified by: chest auscultation and capnometry   Inital cuff pressure (cm H2O): 23  Measured from: teeth  Secured at 23 cm.  Number of attempts at approach: 1

## 2020-09-22 LAB — BASIC METABOLIC PANEL
Calcium: 8.3 mg/dL — ABNORMAL LOW (ref 8.6–10.0)
Carbon Dioxide Total: 25 mmol/L (ref 22–29)
Chloride: 104 mmol/L (ref 98–107)
Creatinine Serum: 0.63 mg/dL (ref 0.51–1.17)
E-GFR Creatinine (Male): >100 mL/min/{1.73_m2}
Glucose: 100 mg/dL (ref 74–109)
Potassium: 4.5 mmol/L (ref 3.4–5.1)
Sodium: 139 mmol/L (ref 136–145)
Urea Nitrogen, Blood (BUN): 16 mg/dL (ref 6–20)

## 2020-09-22 LAB — CBC NO DIFFERENTIAL
Hematocrit: 34.3 % — ABNORMAL LOW (ref 41.0–53.0)
Hemoglobin: 11.7 g/dL — ABNORMAL LOW (ref 13.5–17.5)
MCH: 30 pg (ref 27.0–33.0)
MCHC: 34.1 % (ref 32.0–36.0)
MCV: 88 fL (ref 80.0–100.0)
MPV: 8.1 fL (ref 6.8–10.0)
Platelet Count: 149 10*3/uL (ref 130–400)
RDW: 13.2 % (ref 0.0–14.7)
Red Blood Cell Count: 3.9 10*6/uL — ABNORMAL LOW (ref 4.50–5.90)
White Blood Cell Count: 7.4 10*3/uL (ref 4.5–11.0)

## 2020-09-22 LAB — CBC WITH DIFFERENTIAL
Basophils % Auto: 0.2 %
Basophils Abs Auto: 0 10*3/uL (ref 0.0–0.2)
Eosinophils % Auto: 0.2 %
Eosinophils Abs Auto: 0 10*3/uL (ref 0.0–0.5)
Hematocrit: 34.8 % — ABNORMAL LOW (ref 41.0–53.0)
Hemoglobin: 11.9 g/dL — ABNORMAL LOW (ref 13.5–17.5)
Lymphocytes % Auto: 30.5 %
Lymphocytes Abs Auto: 2.6 10*3/uL (ref 1.0–4.8)
MCH: 30.3 pg (ref 27.0–33.0)
MCHC: 34.3 % (ref 32.0–36.0)
MCV: 88.4 fL (ref 80.0–100.0)
MPV: 8.3 fL (ref 6.8–10.0)
Monocytes % Auto: 11.4 %
Monocytes Abs Auto: 1 10*3/uL — ABNORMAL HIGH (ref 0.1–0.8)
Neutrophils % Auto: 57.7 %
Neutrophils Abs Auto: 4.8 10*3/uL (ref 1.8–7.7)
Platelet Count: 278 10*3/uL (ref 130–400)
RDW: 13.4 % (ref 0.0–14.7)
Red Blood Cell Count: 3.94 10*6/uL — ABNORMAL LOW (ref 4.50–5.90)
White Blood Cell Count: 8.4 10*3/uL (ref 4.5–11.0)

## 2020-09-22 LAB — PHOSPHORUS (PO4): Phosphorus (PO4): 3.7 mg/dL (ref 2.5–4.5)

## 2020-09-22 LAB — MAGNESIUM (MG): Magnesium (Mg): 2.1 mg/dL (ref 1.6–2.4)

## 2020-09-22 LAB — C DIFFICILE SURVEILLANCE TEST: Test Result: NEGATIVE

## 2020-09-22 MED ORDER — HEPARIN (PORCINE) 5,000 UNIT/ML SOLUTION FOR INJECTION
5000.0000 [IU] | Freq: Three times a day (TID) | INTRAMUSCULAR | Status: DC
Start: 2020-09-22 — End: 2020-09-29
  Administered 2020-09-22 – 2020-09-29 (×19): 5000 [IU] via SUBCUTANEOUS
  Filled 2020-09-22 (×19): qty 1

## 2020-09-22 MED ORDER — ENOXAPARIN 40 MG/0.4 ML SUBCUTANEOUS SYRINGE
40.0000 mg | INJECTION | SUBCUTANEOUS | Status: DC
Start: 2020-09-22 — End: 2020-09-22

## 2020-09-22 MED ORDER — HYDROMORPHONE 1 MG/ML INJECTION SYRINGE
0.2000 mg | INJECTION | INTRAMUSCULAR | Status: DC | PRN
Start: 2020-09-22 — End: 2020-09-23
  Administered 2020-09-22 – 2020-09-23 (×2): 0.2 mg via INTRAVENOUS
  Filled 2020-09-22 (×2): qty 1

## 2020-09-22 MED ORDER — HYDROMORPHONE (PF) 50 MG/50 ML PCA SYRINGE (DISCRETE DOSE) - ADULT
INTRAVENOUS | Status: DC
Start: 2020-09-22 — End: 2020-09-22

## 2020-09-22 MED ORDER — GABAPENTIN 100 MG CAPSULE
100.0000 mg | ORAL_CAPSULE | Freq: Three times a day (TID) | ORAL | Status: DC
  Administered 2020-09-22 – 2020-09-29 (×22): 100 mg via ORAL
  Filled 2020-09-22 (×23): qty 1

## 2020-09-22 MED ORDER — HYDROMORPHONE (PF) 50 MG/50 ML PCA SYRINGE (DISCRETE DOSE) - ADULT
INTRAVENOUS | Status: DC
Start: 2020-09-22 — End: 2020-09-23

## 2020-09-22 MED ORDER — BACITRACIN 500 UNIT/GRAM TOPICAL OINTMENT
TOPICAL_OINTMENT | Freq: Two times a day (BID) | TOPICAL | Status: DC
Start: 2020-09-22 — End: 2020-09-24
  Filled 2020-09-22: qty 28.4

## 2020-09-22 NOTE — Progress Notes (Signed)
SPECIALTY SURGERY - PROGRESS NOTE  Foregut, Metabolic, and General Surgery     Date of Admission: 09/21/2020 (LOS: 2)  Note Date and Time: 09/23/2020 08:41    OPERATIONS AND PROCEDURES      Procedure(s):  REPAIR, HERNIA, INCISIONAL (N/A)  INSERTION, MESH, ABDOMINAL WALL, ANTERIOR (N/A)  REPAIR, HERNIA, INGUINAL, UNILATERAL (Right)  COMPONENT SEPARATION, ABDOMINAL WALL (Bilateral)  (09/21/2020 - Shellia Cleverly, MD )  2 Days Post-Op    MAJOR 24 HOUR EVENTS     - 2 Days Post-Op s/p open ventral hernia and right inguinal hernia repair with mesh   - PCA increased to 0.3 every 10 minutes, pain much better controlled   - Started SQH given stable Hb  - Tolerating clear liquids, had one smear of a BM but no flatus  - Drains with serosanguineous output  - Denies nausea or vomiting with CLD, took in 675 cc    MEDICATIONS    All medications have been reviewed in the [facility] and changes reflected in the plan.     FAMILY AND SOCIAL HISTORY    Family and social history were reviewed in the patient's initial history and physical.   All changes are reflected in the plan.     VITAL SIGNS     Current Vitals  Temp: 36.4 C (97.5 F)  Pulse: 93  BP: 114/70  Resp: 20  SpO2: 98 % 24 Hour Vitals (Min/Max)  Temp Max: 36.9 C (98.5 F)  Pulse Min: 68 Max: 93  BP: (100-114)/(68-77)    Resp Min: 12 Max: 20  SpO2 Min: 93 % Max: 99 %     INTAKE AND OUTPUT:  I/O Last 2 Completed Shifts:  In: 2428.8 [Oral:675; Crystalloid:1737.3]  Out: 1505 [Urine:1200; Drains:305]  Weight: 75.6 kg (166 lb 10.7 oz) (09/21/20 9798)     PHYSICAL EXAM     General: No acute distress. Comfortable.  HEENT: Atraumatic. EtCO2 monitor in place.  Cardiac: Stable HR and BP, symmetric chest rise  Pulmonary: Breathing on RA  Abdomen: soft, non-tender, mildly tense to bilateral lower quadrants. Midline prevena holding suction. JP drains x3 with lighter serosanguineous output.   JP1: 85 cc   JP2:115 cc  JP3: 105 cc  GU: External catheter in place with light yellow urine  Extremity:  Severely contracted bilateral lower extremity, moving upper extremities    RELEVANT LABS STUDIES     Hemoglobin   Date Value Ref Range Status   09/23/2020 12.7 (L) 13.5 - 17.5 g/dL Final   09/22/2020 11.7 (L) 13.5 - 17.5 g/dL Final   09/22/2020 11.9 (L) 13.5 - 17.5 g/dL Final     CLINICAL COURSE, ASSESSMENT, AND PLAN     Roy Henry is a 59yr man with history of MS and severely contracted BLE who is POD2 s/p open ventral hernia repair and right inguinal hernia repair with mesh. He is a functional quadriplegic and requires total nursing care for ADLs and safety. He is recovering appropriately, but with slow return of bowel function. However, pain is better controlled this morning. We will add oral pain meds in attempt to wean the PCA and encourage more physical activity this morning. Continue CLD given no flatus or true BM yet.    - Continue limited CLD at this time and await return of bowel function  - multimodal bowel care with miralax, colace, senna  - Aspiration precautions as patient is bed-bound at baseline  - Continue PCA while on CLD, gabapentin, and tylenol   -  Add oral oxycodone, wean PCA as able  - Encourage IS and pulmonary hygiene  - Physical therapy consult for in bed exercises and possible OOB to cardiac chair    Management of intertrigo per wound care:  Apply Interdry Ag textile along groins, skin folds, Conform textile so part of the textile is on direct contact with damaged skin and part of it is exposed to air so it wicks away moisture from skin. Change Interdry Ag evey 5 days. Do not toss old Interdry Ag,it can be washed with water only, and let air dry for re- use for up to 3 months.  Interdry Ag is designed to wick away moisture from skin and it is impregnated with silver for antimicrobial/antifungal effects.    - Will add wound care recommendations:   Apply Mepilex border dressing to sacrum- change every 3 days and PRN for soilage.   Turn and reposition 30 degrees lateral every 2  hours or more frequent and according to patient's medical condition   HOB 30 degrees or less if no contraindications   Continue with waffle mattress   Off load bilateral heels,using WAFFLE boots   Manage moisture,use foley /condom cath /flexiseal or rectal pouch as indicated    Avoid diapers ,except during therapy    Use white breathable pads for better skin microclimate    Continue with efforts to optimize hydration and nutrition    Miscellaneous Care Items:  - Current Diet: Limited clear liquid  - Foley: External foley catheter in place.   - DVT PPX: SCD; Heparin 5000 SQ TID   - PT/OT: Yes, ordered; pending evaluation     Anticipate DC in 2-3 days pending clearance of post-operative milestones.    The patient was seen and discussed with Bariatric Fellow, Particia Jasper, MD .     Electronically signed by:  Lum Keas, MD  General Surgery Intern  General/Bariatric Surgery [phone]    Surgery Attending Addendum    I saw and examined the patient and agree with the plan as outlined in the Resident's note. We went over the patient's vital signs, I/Os, exam, labs, and reviewed the medications as documented in the note. I agree with the resident's findings and have developed a management/recommendation plan as documented in the resident's note. Edits and addendums made.    Electronically signed by:  Shellia Cleverly, MD, MTM  Assistant Professor  Division of Foregut, Metabolic, and General Surgery   Pager [phone]

## 2020-09-22 NOTE — Care Plan (Signed)
Respiratory Care Evaluation - Pulmonary Hygiene Pathway Initial Evaluation    Name: Roy Henry Admit Date:   09/21/2020  5:32 AM Date of Birth:   1971-06-02     Age: 50 yrs Sex: male        Chief Complaint:   No chief complaint on file.      Risk Factor Analysis:    *If any part of the risk factor analysis cannot be filled out, do not apply the tool: assess/tx as appropriate. Physician may order a new evaluation via the risk factor analysis at any time.    Score with Spirometry  0 - 4 points = low risk: no atelectasis; no retained secretions  5 - 9 points = moderate risk  10 - 17 points = high risk  18 - 24 points = acute risk    Score without Spirometry  0 - 3 points = low risk: no atelectasis; no retained secretions  4 - 7 points = moderate risk  8 - 14 points = high risk  15 - 20 points = acute risk    Spirometry:  No spirometry on file  Age:  <65 = 0  Obesity:  <150% IBW = 0  Surgery Location:  upper abdominal = 2  Pulmonary Hx:  None = 0  Incentive Spirometry:  < 62ml/kg IBW = 1  Atelectasis/consolidation on CXR:  no atelectasis/consolidation/cxray = 0  Smoking Hx:  quit >= 5 yrs ago = 0  Mobility Issues:   Significant: bed/wheelchair bound = 2    Risk Factor Total Score: 5  Risk Level: Low Risk          Treatment Plan by RT:   Modality: PAP  Mouthpiece  Frequency: BID    Treatment Plan by RN:  4x/day deep breathing, I.S., cough/splint, ambulate/chair per pathway.     *Moderate through acute risk patients will be re-evaluated every 24 hours using the Progress Scoring Tool.    If patient's condition changes please reorder Respiratory Care Evaluation.    Ursula Beath, RT

## 2020-09-22 NOTE — Consults (Signed)
Wound care nurse consult note    Reason for referral- "wound care to intertriginous regions"    Roy Henry  50 y/o male h/o MS, s/p open ventral hernia and right inguinal hernia repair with mesh.  WAFFLE in place.   Unable to assess the deep inguinal skin fold  due to severe BLE contractures. Cream is in place.    Management of intertrigo:    Apply Interdry Ag textile along groins, skin folds, Conform textile so part of the textile is on direct contact with damaged skin and part of it is exposed to air so it wicks away moisture from skin. Change Interdry Ag evey 5 days. Do not toss old Interdry Ag,it can be washed with water only, and let air dry for re- use for up to 3 months.    Interdry Ag is designed to wick away moisture from skin and it is impregnated with silver for antimicrobial/antifungal effects.    1- continue pressure prevention measures.     Apply Mepilex border dressing to sacrum- change every 3 days and PRN for soilage.   Turn and reposition 30 degrees lateral every 2 hours or more frequent and according to patient's medical condition   HOB 30 degrees or less if no contraindications   Continue with waffle mattress   Off load bilateral heels,using WAFFLE boots   Manage moisture,use foley /condom cath /flexiseal or rectal pouch as indicated    Avoid diapers ,except during therapy    Use white breathable pads for better skin microclimate    Continue with efforts to optimize hydration and nutrition    Thank you and call for any questions  Diona Foley, RN, [facility]  Vocera "wound care team"  [phone]

## 2020-09-22 NOTE — Progress Notes (Signed)
SPECIALTY SURGERY - PROGRESS NOTE  Foregut, Metabolic, and General Surgery     Date of Admission: 09/21/2020 (LOS: 1)  Note Date and Time: 09/22/2020 07:33    OPERATIONS AND PROCEDURES      Procedure(s):  REPAIR, HERNIA, INCISIONAL (N/A)  INSERTION, MESH, ABDOMINAL WALL, ANTERIOR (N/A)  REPAIR, HERNIA, INGUINAL, UNILATERAL (Right)  COMPONENT SEPARATION, ABDOMINAL WALL (Bilateral)  (09/21/2020 - Shellia Cleverly, MD )  1 Day Post-Op    MAJOR 24 HOUR EVENTS     - POD1 s/p open ventral hernia and right inguinal hernia repair with mesh  - Pain not well controlled overnight, increased PCA to 0.2 q15, morphine x1 given, but patient reports much improved pain this morning, then worsening when seen again  - Denies nausea/vomiting, has sipped some water  - Drains with serosanguineous output, right drain with more output than left per nursing  - Breathing on room air, denies shortness of breath     MEDICATIONS    All medications have been reviewed in the [facility] and changes reflected in the plan.     FAMILY AND SOCIAL HISTORY    Family and social history were reviewed in the patient's initial history and physical.   All changes are reflected in the plan.     VITAL SIGNS     Current Vitals  Temp: 36.6 C (97.8 F)  Pulse: 81  BP: 97/61  Resp: 12  SpO2: 98 % 24 Hour Vitals (Min/Max)  Temp Max: 37 C (98.6 F)  Pulse Min: 78 Max: 86  BP: (97-147)/(61-92)    Resp Min: 9 Max: 13  SpO2 Min: 92 % Max: 98 %     INTAKE AND OUTPUT:  I/O Last 2 Completed Shifts:  In: 3052.8 [Oral:30; Crystalloid:3019.3]  Out: 1950 [Urine:750; Drains:350; Other:50]  Weight: 75.6 kg (166 lb 10.7 oz) (09/21/20 8469)     PHYSICAL EXAM     General: No acute distress. Comfortable.  HEENT: Atraumatic  Cardiac: Stable HR and BP  Pulmonary: Breathing on RA  Abdomen: softer abdomen, mildly tender near drain sites, non-distended. Midline prevena holding suction. JP drains x3 with serosanguineous output.   JP1: 160 cc   JP2: 70 cc  JP3: 120 cc  GU: foley in place with light  yellow urine  Extremity: severely contracted bilateral lower extremity, moving upper extremities    UOP: 750 cc     RELEVANT LABS STUDIES     Hemoglobin   Date Value Ref Range Status   09/22/2020 11.9 (L) 13.5 - 17.5 g/dL Final   09/21/2020 13.0 (L) 13.5 - 17.5 g/dL Final   08/07/2020 11.8 (L) 13.5 - 17.5 g/dL Final     Hb drop from 13.0 to 11.9     CLINICAL COURSE, ASSESSMENT, AND PLAN     Roy Henry is a 59yr man with history of MS and severely contracted BLE who is POD1 s/p open ventral hernia repair and right inguinal hernia repair with mesh. He is recovering appropriately. Pain is better controlled this morning. Will re-check CBC as Hb dropped greater than 1 point. Will advance to a limited CLD.    - Advance to a limited CLD (500 cc/shift), will need 1:1 assist   - Aspiration precautions as patient is bed-bound at baseline  - Continue IV pain control while on CLD  - continue foley given significant pain and bed-bound status  - Encourage IS and pulmonary hygiene  - Follow up repeat CBC    Miscellaneous Care Items:  -  Current Diet: Limited clear liquid  - Foley: Foley in place. Will d/c this morning  - DVT PPX: SCD; Will re-check CBC, start SQH pending stability  - PT/OT: Does not need    Anticipate DC in 2-3 days pending clearance of post-operative milestones.    The patient was seen and discussed with Bariatric Fellow, Particia Jasper, MD .     Electronically signed by:  Lum Keas, MD  General Surgery Intern  General/Bariatric Surgery [phone]    Surgery Attending Addendum    I saw and examined the patient and agree with the plan as outlined in the Resident's note. We went over the patient's vital signs, I/Os, exam, labs, and reviewed the medications as documented in the note. I agree with the resident's findings and have developed a management/recommendation plan as documented in the resident's note. Edits and addendums made.    Electronically signed by:  Shellia Cleverly, MD, MTM  Assistant  Professor  Division of Foregut, Metabolic, and General Surgery   Pager [phone]

## 2020-09-22 NOTE — Clinical Case Management (Signed)
Clinical Case Management Assessments    Name: Roy Henry  MRN: [id number]   Date of Birth: 1970/07/28 (86yr) Gender: male    Note Date: 09/22/2020 Note Time: 10:53       INITIAL ASSESSMENT NOTE    Patient was transferred from Networked reference to record EAF .  Takeback Letter: Not Applicable    Permanent Address: [address]  [place] [address]  Discharge Address: SNF - [facility]: [address], [place], [place] [address]    Patient can follow-up with:   PCP: Patient, No Pcp Per  / Phone Number: None  Preferred Pharmacy: [facility], [place], [phone] [place] [facility]      Funding/Billing: Payor: MEDICARE / Plan: MEDICARE PART A&B / Product Type: *No Product type* /    Secondary Insurance: PARTNER[employer]IP [employer] [place]      Reason for admission: 09/21/2020 PROCEDURE: open ventral hernia and right inguinal hernia repair with mesh    Patient able to participate in plan?: Yes  Living arrangements: Other (comment)  Type of Residence: skilled nursing facility  Home Environment (floors/stairs): [facility] [phone]  Who will be the primary caregiver after discharge?  Phone #? : Burke Keels [phone]  If 24 hr. supervision is required, who will be the additional support person(s)?  Phone #?: has been at SNF for the past 5 months  Developmental Level Appropriate (Pediatrics): N/A  CCS (Pediatrics): N/A  Pre-Hospitalization self-care deficits: Bathing, Toileting, Eating  Pre-Hospitalization mobility: Dependent severe assistance required  Bladder function: Incontinent  Bowel function: Incontinent  Pre-Hospital Services: None  Type of home health care services in place: None  DME in place: None  Patient's goal upon discharge (in patient's own words) : back to [facility]  Anticipated Preliminary Discharge Disposition: SNF  Does the patient have ongoing DC Planning needs?: Yes  Is the patient ready for discharge today?: No     The list of  accepting post-acute providers/ the pt choice letter and the quality rating information has been provided to the pt/ or designated decision maker.    Comments:   Chart reviewed, spoke with pt re: DCP - confirmed pt came from [facility] and his plan is to return there post DC.  Pt requires assist with all ADLs, BLE contractures. CM will initiate SNF referral to [facility] once medically stable and DC needs known.  CM to follow.    Per MD:   Roy Henry is a 17yr man with history of MS and severely contracted BLE who is POD1 s/p open ventral hernia repair and right inguinal hernia repair with mesh    Follow-up/recommendations:  Return to [facility]    Date/Time: 09/22/2020 10:53  Electronically Signed by:   Fredirick Lathe, Case Manager  Pager: [phone]

## 2020-09-22 NOTE — Nurse Assessment (Signed)
ASSESSMENT NOTE    Note Started: 09/22/2020, 08:47     Initial assessment completed and recorded in EMR.  Report received from night shift nurse and orders reviewed. Plan of Care reviewed and updated, discussed with patient.  Raquel James,  RN

## 2020-09-22 NOTE — Care Plan (Addendum)
Problem: Adult Inpatient Plan of Care  Goal: Plan of Care Review  Outcome: Ongoing, Progressing  Flowsheets (Taken 09/22/2020 1841)  Plan of Care Reviewed With: patient  Progress: improving  Outcome Summary: POD1 - A&O x 4. VS stable Pain control improved after PCA Dilaudid dose increased, now at 0.3 q10 min.Started on CL diet, can only have 750 ml q 12H shift. ML abd incision to wound vac. JP x 3, 1 in R side, 2 in L side, draining moderate amount of bloody drainage. Foley DC'd this morning, condom cath placed, had 100 ml output, no PVR by bladder scan, team aware. BLE and R arm contracted, able to use L arm but still need assist with feeding. Lift team assist with turning. Groins redness & moist areas, Zinc oitnment applied. Mepilex placed on coccyx for protection, on waffle mattress

## 2020-09-22 NOTE — Care Plan (Signed)
Problem: Adult Inpatient Plan of Care  Goal: Plan of Care Review  Outcome: Ongoing, Progressing  Flowsheets (Taken 09/22/2020 0457)  Plan of Care Reviewed With: patient  Progress: improving  Outcome Summary: BLE and RUE contracted, hx of multiple sclerosis, POD#1, VSS, pain issue at the start of the shift, PCA dose increased to 0.2mg  q28min, morphine IV x1 given, pain well controlled now, JP x3, good UOP via foley cathter, wound vac, abd binder in place, q2hr turn with LT, AM lab sent.

## 2020-09-23 LAB — BASIC METABOLIC PANEL
Calcium: 8.6 mg/dL (ref 8.6–10.0)
Carbon Dioxide Total: 25 mmol/L (ref 22–29)
Chloride: 101 mmol/L (ref 98–107)
Creatinine Serum: 0.6 mg/dL (ref 0.51–1.17)
E-GFR Creatinine (Male): >100 mL/min/{1.73_m2}
Glucose: 100 mg/dL (ref 74–109)
Potassium: 4.2 mmol/L (ref 3.4–5.1)
Sodium: 136 mmol/L (ref 136–145)
Urea Nitrogen, Blood (BUN): 10 mg/dL (ref 6–20)

## 2020-09-23 LAB — CBC WITH DIFFERENTIAL
Bands %: 1.9 %
Eosinophils %: 2.9 %
Eosinophils Abs: 0.3 10*3/uL (ref 0.1–0.3)
Hematocrit: 37.5 % — ABNORMAL LOW (ref 41.0–53.0)
Hemoglobin: 12.7 g/dL — ABNORMAL LOW (ref 13.5–17.5)
Lymphocytes %: 23.8 %
Lymphocytes Abs: 2.5 10*3/uL (ref 1.0–4.8)
MCH: 30.3 pg (ref 27.0–33.0)
MCHC: 33.9 % (ref 32.0–36.0)
MCV: 89.4 fL (ref 80.0–100.0)
MPV: 8.2 fL (ref 6.8–10.0)
Monocytes %: 5.7 %
Monocytes Abs: 0.6 10*3/uL — ABNORMAL HIGH (ref 0.2–0.4)
Neutrophil Abs: 7.2 10*3/uL (ref 1.8–7.7)
Platelet Count: 243 10*3/uL (ref 130–400)
Platelet Estimate, Smear: ADEQUATE
Polys (Segs) %: 65.7 %
RBC- Color, Size, Shape: NORMAL
RDW: 13.5 % (ref 0.0–14.7)
Red Blood Cell Count: 4.2 10*6/uL — ABNORMAL LOW (ref 4.50–5.90)
White Blood Cell Count: 10.7 10*3/uL (ref 4.5–11.0)

## 2020-09-23 LAB — PHOSPHORUS (PO4): Phosphorus (PO4): 2.7 mg/dL (ref 2.5–4.5)

## 2020-09-23 LAB — CULTURE SURVEILLANCE, MRSA

## 2020-09-23 LAB — MAGNESIUM (MG): Magnesium (Mg): 1.9 mg/dL (ref 1.6–2.4)

## 2020-09-23 MED ORDER — OXYCODONE 5 MG TABLET
5.0000 mg | ORAL_TABLET | ORAL | Status: DC | PRN
Start: 2020-09-23 — End: 2020-09-24

## 2020-09-23 MED ORDER — HYDROMORPHONE (PF) 50 MG/50 ML PCA SYRINGE (DISCRETE DOSE) - ADULT
INTRAVENOUS | Status: DC
Start: 2020-09-23 — End: 2020-09-26
  Filled 2020-09-23: qty 50

## 2020-09-23 MED ORDER — NALOXONE 0.4 MG/ML INJECTION SOLUTION: 0.1000 mg | INTRAMUSCULAR | Status: DC | PRN

## 2020-09-23 MED ORDER — OXYCODONE 5 MG TABLET
10.0000 mg | ORAL_TABLET | ORAL | Status: DC | PRN
Start: 2020-09-23 — End: 2020-09-24
  Administered 2020-09-23 – 2020-09-24 (×3): 10 mg via ORAL
  Filled 2020-09-23 (×3): qty 2

## 2020-09-23 NOTE — Care Plan (Addendum)
Respiratory Care Evaluation - Pulmonary Hygiene Pathway Daily Evaluation  Name: Roy Henry Admit Date:   09/21/2020  5:32 AM Date of Birth:   [birth year]     Age: 50 yrs Sex: male      Chief Complaint:   No chief complaint on file.      Date of Initial Respiratory Care Evaluation Order: 09/22/20    Progress Scoring Tool  *Score > 18 indicates readiness to discontinue post-operative chest physiotherapy    Mobility: unable to achieve preoperative mobility status = 0  Breath sounds: slightly decreased breath sounds or presence of minimal adventitia = 2  Secretion clearance: able to clear secretions independently/return = 3  SpO2 at rest and during activity (assessed on baseline/pre-op FiO2): SpO2 >92% (w/o pulmonary hx) or SpO2 >88% (with pulmonary hx) = 3  RR at rest and during activity: within acceptable range for this individual = 3  CXRAY: XRAY WDL for this individual/no Cxray/imaging for 3 days = 2  Incentive spirometry: > 10 mls/kg IBW = 2  Pleural chest tubes: no chest tubes remain = 2    Progress Score: 17    Treatment Plan by RT:   Risk Level: Moderate Risk - Positive pressure therapy; BID for atelectasis, or Acapella Therapy BID for atelectasis and mucokinesis  Modality: PAP  Mouthpiece  Frequency: BID    Treatment Plan by RN:  4x/day deep breathing, I.S., cough/splint, ambulate/chair per pathway.     If patient's condition changes please reorder Respiratory Care Evaluation.      Ursula Beath, RT

## 2020-09-23 NOTE — MD Query (Signed)
MEDICAL RECORD INPATIENT CODING UNIT  CLINICAL DOCUMENTATION IMPROVEMENT REQUEST    Patient Name:   Roy Henry  Acct:     MRN: [id number]  [place] Date: 09/21/2020   Physician: Dr. Shellia Cleverly         Additional documentation/clarification is needed to ensure that the severity of illness, risk of mortality, and DRG are accurate for your patient's encounter.    The following clinical information needs clarification.  Please see my question(s) below.  To respond, addend and sign this query.  Your addendum and signature will be filed as a permanent note in the patient's legal medical record.      The medical record reflects the following clinical findings, treatment, and risk factors.        Clinical Findings:   Per the Physician Progress Note 09/22/20- "...history of MS and severely contracted BLE...continue foley given significant pain and bed-bound status"  Per the Nursing Flow-sheet 09/22/20- " Completely dependant, Braden scores 13, Total fall risk assessment score- 50"  Per the Clinical case Manager note 09/22/20- "Pre-Hospitalization self-care deficits: Bathing, Toileting, Eating  Pre-Hospitalization mobility: Dependent severe assistance required Bladder function: Incontinent Bowel function: Incontinent"    Risk Factors: MS, BLE contractures, Dependent severe assistance required    Treatment: Total nursing care for ADL's and safety, medications, frequent weight shift assistance       QUESTION: Based on your clinical judgment, please further clarify the functional status of patient, such as:     1. Functional Quadriplegia, treated/monitored as above     2. Other explanation or diagnosis supported by the clinical findings (please provide)      3. Unable to determine a diagnosis (no explanation for clinical findings)          Please provide your answer below by clicking the addend button     1. Functional Quadriplegia, treated/monitored as above    e-signed  Shellia Cleverly MD       Per CMS- "functional quadriplegia  ("refers to complete immobility due to severe physical disability or frailty") Conditions such as cerebral palsy, stroke, pressure ulcers, contractures, advanced dementia, etc. can also cause functional paralysis that may extend to all limbs hence, the diagnosis functional quadriplegia. For individuals with these types of severe physical disabilities, where there is minimal ability for purposeful movement." https://www.health.state.mn.us/facilities/regulation/casemix/activediagnosis.html      ANSWER:              Thank you,    Cyd Silence, RN, [place] Documentation Specialist  Health Information Management (HIM) Division  Financial Services   [phone]

## 2020-09-23 NOTE — Nurse Assessment (Signed)
ASSESSMENT NOTE    Note Started: 09/23/2020, 07:19     Initial assessment completed and recorded in EMR.  Report received from night shift nurse and orders reviewed. Plan of Care reviewed and updated, discussed with patient.  Raquel James, RN

## 2020-09-23 NOTE — Nurse Assessment (Signed)
ASSESSMENT NOTE    Note Started:      Initial assessment completed and recorded in EMR.  Report received from day shift nurse and orders reviewed. Plan of Care reviewed and updated, discussed with patient.  Alfonso Patten, RN RN

## 2020-09-23 NOTE — Care Plan (Addendum)
Problem: Adult Inpatient Plan of Care  Goal: Plan of Care Review  Outcome: Ongoing, Progressing  Flowsheets (Taken 09/23/2020 0451)  Plan of Care Reviewed With: patient  Progress: no change  Outcome Summary: /Ox4. VSS. Diluadid PCA 0.3 q28mins. Clear liquid diet, 750cc per shift restriction. JP x 3, 1 in R side, 2 in L side. Condom cath. Good output from condom cath. Zinc applied to groin area for redness. Lift team for turns , contracted. Feeder. Only move Lt arm. Valium given at 0330. Labs drawn and sent.PRN dilaudid given x1.

## 2020-09-23 NOTE — Care Plan (Signed)
Problem: Adult Inpatient Plan of Care  Goal: Plan of Care Review  Outcome: Ongoing, Progressing  Flowsheets (Taken 09/23/2020 1737)  Plan of Care Reviewed With: patient  Progress: improving  Outcome Summary: POD 2. A&O x 4. VS stable. Tolerating CL diet, needs 1:1 assist with feeding. No flatus yet. ML abd incision with wound vac dressing. JP x 3. Pain controlled well, being weaned off from PCA now at 0.1 mg q 10 min, started on oxycodone PRN given x 1. Up on cardiac chair for 3.5 H, tolerated well. Condom cath in place, with adequate urine output. Valium given q 6H PRN, usually takes routinely prior to admission to help with spasms. BLE contracted, unable to apply waffle boots, pt on waffle mattress. Zinc ointment on bil groins, interdry placed. Lift team assist with repositioning

## 2020-09-23 NOTE — Nurse Assessment (Signed)
ASSESSMENT NOTE    Note Started: 09/23/2020, 19:12     Initial assessment completed.Report received from day shift nurse and orders reviewed. Plan of Care reviewed and appropriate, discussed with patient.  Louie Bun, RN RN

## 2020-09-23 NOTE — Clinical Case Management (Signed)
Clinical Case Management Assessments    Name: Roy Henry  MRN: [id number]   Date of Birth: [birth year] (14yr) Gender: male    Note Date: 09/23/2020 Note Time: 09:46       DISCHARGE PLANNING NOTE    Patient was transferred from Networked reference to record EAF .  Takeback Letter: Not Applicable    Permanent Address: [address]  [place] [address]    Discharge Address: SNF - [facility]: [address] [place], [place] [address]    Patient can follow-up with:   PCP: Patient, No Pcp Per  / Phone Number: None  Preferred Pharmacy: [facility], [place], [phone] [facility]    Funding/Billing: Payor: MEDICARE / Plan: MEDICARE PART A&B / Product Type: *No Product type* /    Secondary Insurance: [place]        Reason for admission: 09/21/2020 PROCEDURE: open ventral hernia and right inguinal hernia repair with mesh    Patient able to participate in plan?: Yes  Living arrangements: Other (comment)  Type of Residence: skilled nursing facility  Home Environment (floors/stairs): [facility] [phone]  Who will be the primary caregiver after discharge?  Phone #? : Burke Keels [phone]  Pre-Hospitalization self-care deficits: Bathing, Toileting, Eating  Pre-Hospitalization mobility: Dependent severe assistance required  Type of home health care services in place: None  Anticipated home health care services: None  Anticipated Home Infusion: None  DME in place: None  Anticipated DME: None  Patient's goal upon discharge (in patient's own words) : back to [facility]  Anticipated Disposition: SNF  Does the patient have ongoing DC Planning needs?: Yes  Is the patient ready for discharge today?: No  Anticipated Barriers to Discharge: None    SNF Facility Name: [facility]  Ph: [phone]    [address], [place], [place] [address]  The list of accepting post-acute providers/ the pt  choice letter and the quality rating information has been provided to the pt/ or designated decision maker.    Comments:   Spoke with [place] team, anticipate DC back to [facility] Friday - pending medical stability. Team working on pain control.  SNF referral initiated and sent to [facility] to confirm acceptance at DC.    CM to follow.     Follow-up/recommendations:  DC back to SNF - pending confirmation acceptance      Date/Time: 09/23/2020 09:46  Electronically Signed by:   Fredirick Lathe, Case Manager  Pager: [phone]

## 2020-09-24 ENCOUNTER — Inpatient Hospital Stay (HOSPITAL_COMMUNITY): Payer: Medicare Other

## 2020-09-24 DIAGNOSIS — R109 Unspecified abdominal pain: Secondary | ICD-10-CM

## 2020-09-24 LAB — BASIC METABOLIC PANEL
Calcium: 8.5 mg/dL — ABNORMAL LOW (ref 8.6–10.0)
Carbon Dioxide Total: 24 mmol/L (ref 22–29)
Chloride: 102 mmol/L (ref 98–107)
Creatinine Serum: 0.47 mg/dL — ABNORMAL LOW (ref 0.51–1.17)
E-GFR Creatinine (Male): >100 mL/min/{1.73_m2}
Glucose: 116 mg/dL — ABNORMAL HIGH (ref 74–109)
Potassium: 4.1 mmol/L (ref 3.4–5.1)
Sodium: 135 mmol/L — ABNORMAL LOW (ref 136–145)
Urea Nitrogen, Blood (BUN): 5 mg/dL — ABNORMAL LOW (ref 6–20)

## 2020-09-24 LAB — CBC WITH DIFFERENTIAL
Basophils % Auto: 0.1 %
Basophils Abs Auto: 0 10*3/uL (ref 0.0–0.2)
Eosinophils % Auto: 0.9 %
Eosinophils Abs Auto: 0.1 10*3/uL (ref 0.0–0.5)
Hematocrit: 29.2 % — ABNORMAL LOW (ref 41.0–53.0)
Hemoglobin: 9.9 g/dL — ABNORMAL LOW (ref 13.5–17.5)
Lymphocytes % Auto: 21.5 %
Lymphocytes Abs Auto: 1.4 10*3/uL (ref 1.0–4.8)
MCH: 30.5 pg (ref 27.0–33.0)
MCHC: 34 % (ref 32.0–36.0)
MCV: 89.9 fL (ref 80.0–100.0)
MPV: 8.2 fL (ref 6.8–10.0)
Monocytes % Auto: 11.5 %
Monocytes Abs Auto: 0.8 10*3/uL (ref 0.1–0.8)
Neutrophils % Auto: 66 %
Neutrophils Abs Auto: 4.4 10*3/uL (ref 1.8–7.7)
Platelet Count: 223 10*3/uL (ref 130–400)
RDW: 13 % (ref 0.0–14.7)
Red Blood Cell Count: 3.25 10*6/uL — ABNORMAL LOW (ref 4.50–5.90)
White Blood Cell Count: 6.6 10*3/uL (ref 4.5–11.0)

## 2020-09-24 LAB — CBC NO DIFFERENTIAL
Hematocrit: 31 % — ABNORMAL LOW (ref 41.0–53.0)
Hemoglobin: 10.6 g/dL — ABNORMAL LOW (ref 13.5–17.5)
MCH: 30.9 pg (ref 27.0–33.0)
MCHC: 34.3 % (ref 32.0–36.0)
MCV: 90.3 fL (ref 80.0–100.0)
MPV: 8.1 fL (ref 6.8–10.0)
Platelet Count: 223 10*3/uL (ref 130–400)
RDW: 13 % (ref 0.0–14.7)
Red Blood Cell Count: 3.43 10*6/uL — ABNORMAL LOW (ref 4.50–5.90)
White Blood Cell Count: 6.3 10*3/uL (ref 4.5–11.0)

## 2020-09-24 LAB — MAGNESIUM (MG): Magnesium (Mg): 1.6 mg/dL (ref 1.6–2.4)

## 2020-09-24 LAB — PHOSPHORUS (PO4): Phosphorus (PO4): 1.8 mg/dL — ABNORMAL LOW (ref 2.5–4.5)

## 2020-09-24 MED ORDER — DIAZEPAM 5 MG/ML INJECTION SYRINGE
5.0000 mg | INJECTION | Freq: Four times a day (QID) | INTRAMUSCULAR | Status: DC | PRN
  Administered 2020-09-24 – 2020-09-25 (×3): 5 mg via INTRAVENOUS
  Filled 2020-09-24 (×3): qty 2

## 2020-09-24 MED ORDER — KETOROLAC 15 MG/ML INJECTION SOLUTION
15.0000 mg | Freq: Four times a day (QID) | INTRAMUSCULAR | Status: DC | PRN
Start: 2020-09-24 — End: 2020-09-25

## 2020-09-24 MED ORDER — PANTOPRAZOLE 40 MG INTRAVENOUS SOLUTION
40.0000 mg | INTRAVENOUS | Status: DC
Start: 2020-09-24 — End: 2020-09-25
  Administered 2020-09-24 – 2020-09-25 (×2): 40 mg via INTRAVENOUS
  Filled 2020-09-24 (×2): qty 40

## 2020-09-24 MED ORDER — ACETAMINOPHEN 1,000 MG/100 ML (10 MG/ML) INTRAVENOUS SOLUTION
1000.0000 mg | Freq: Four times a day (QID) | INTRAVENOUS | Status: AC
Start: 2020-09-24 — End: 2020-09-25
  Administered 2020-09-24 – 2020-09-25 (×4): 1000 mg via INTRAVENOUS
  Filled 2020-09-24 (×4): qty 100

## 2020-09-24 MED ORDER — KETOROLAC 30 MG/ML (1 ML) INJECTION SOLUTION
30.0000 mg | Freq: Four times a day (QID) | INTRAMUSCULAR | Status: DC | PRN
Start: 2020-09-24 — End: 2020-09-24

## 2020-09-24 MED ORDER — VANCOMYCIN 1,000 MG INTRAVENOUS INJECTION
1000.0000 mg | Freq: Two times a day (BID) | INTRAVENOUS | Status: DC
Start: 2020-09-24 — End: 2020-09-28
  Administered 2020-09-24 – 2020-09-28 (×8): 1000 mg via INTRAVENOUS
  Filled 2020-09-24 (×8): qty 10

## 2020-09-24 MED ORDER — BACITRACIN 500 UNIT/GRAM TOPICAL OINTMENT
TOPICAL_OINTMENT | Freq: Two times a day (BID) | TOPICAL | Status: DC
Start: 2020-09-24 — End: 2020-09-25
  Filled 2020-09-24: qty 28.4

## 2020-09-24 MED ORDER — BISACODYL 10 MG RECTAL SUPPOSITORY
10.0000 mg | Freq: Once | RECTAL | Status: AC
Start: 2020-09-24 — End: 2020-09-24
  Administered 2020-09-24: 10 mg via RECTAL
  Filled 2020-09-24: qty 1

## 2020-09-24 MED ORDER — CEFAZOLIN 1 GRAM SOLUTION FOR INJECTION
1.0000 g | Freq: Three times a day (TID) | INTRAMUSCULAR | Status: DC
Start: 2020-09-24 — End: 2020-09-24
  Administered 2020-09-24: 1 g via INTRAVENOUS
  Filled 2020-09-24: qty 5

## 2020-09-24 MED ORDER — FLUCONAZOLE 200 MG/100 ML IN SOD. CHLORIDE (ISO) INTRAVENOUS PIGGYBACK
200.0000 mg | INJECTION | INTRAVENOUS | Status: DC
Start: 2020-09-24 — End: 2020-09-28
  Administered 2020-09-24 – 2020-09-27 (×4): 200 mg via INTRAVENOUS
  Filled 2020-09-24 (×5): qty 100

## 2020-09-24 MED FILL — HYDROmorphone (PF) 50 mg/50 mL (1 mg/mL) in 0.9 % NaCl IV PCA syringe: INTRAVENOUS | Qty: 50 | Status: AC

## 2020-09-24 NOTE — Care Plan (Signed)
Problem: Adult Inpatient Plan of Care  Goal: Plan of Care Review  Outcome: Ongoing, Progressing  Flowsheets (Taken 09/24/2020 1657)  Plan of Care Reviewed With: patient  Progress: improving  Outcome Summary: vss, abd incision with wound vac intact. L JP x2, R JP x1 with serosang fluid. PCA dilaudid for pain control. NPO x meds d/t abd distention, IVF infusing. Condom cath with good UOP. On IV abx. BLE and R arm contracted, able to move L arm. LT turns. Suppository given with good result - large loose BM.

## 2020-09-24 NOTE — MD Query (Signed)
MEDICAL RECORD INPATIENT CODING UNIT  CLINICAL DOCUMENTATION IMPROVEMENT REQUEST    Patient Name:   Roy Henry  Acct:     MRN: [id number]  [place] Date: 09/21/2020   Physician: Dr. Docia Chuck         Additional documentation/clarification is needed to ensure that the severity of illness, risk of mortality, and DRG are accurate for your patient's encounter.    The following clinical information needs clarification.  Please see my question(s) below.  To respond, addend and sign this query.  Your addendum and signature will be filed as a permanent note in the patient's legal medical record.        The medical record reflects the following clinical findings, treatment, and risk factors.     Clinical Findings: Per the Physician H&P- ".Marland KitchenMarland KitchenAt his clinic visit in January 2022, patient was also noted to have a right femoral artery thrombosis..."  Per the MED list- Patient on ASA 81 mg daily as an out patient  Per the 09/23/20 PNP- "DVT DPX: SCD; Heparin 5000 SQ TID"    Risk Factors: MS, functional quadriplegia    Treatment: Heparin inpatient and 81 ASA as out-patient    QUESTION: Based on your clinical judgment, could the right femoral artery thrombosis be further classified as?    1. 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 History of right femoral artery thrombosis, heparin injection 500 units/mL TID and SCD given for DVT prophylaxis    3. Other explanation or diagnosis supported by the clinical findings (please provide)     4. Unable to determine a diagnosis (no explanation for clinical findings)     Please provide your answer below by clicking the addend button     ANSWER:   2. History of right femoral artery thrombosis, heparin injection 500 units/mL TID and SCD given for DVT prophylaxis    E-signed   Shellia Cleverly MD      Thank you,    Dawna Part, RN,  [place] Management (HIM) Division  Financial Services   [phone]

## 2020-09-24 NOTE — Progress Notes (Signed)
SPECIALTY SURGERY - PROGRESS NOTE  Foregut, Metabolic, and General Surgery     Date of Admission: 09/21/2020 (LOS: 4)  Note Date and Time: 09/25/2020 07:54    OPERATIONS AND PROCEDURES      Procedure(s):  REPAIR, HERNIA, INCISIONAL (N/A)  INSERTION, MESH, ABDOMINAL WALL, ANTERIOR (N/A)  REPAIR, HERNIA, INGUINAL, UNILATERAL (Right)  COMPONENT SEPARATION, ABDOMINAL WALL (Bilateral)  (09/21/2020 - Shellia Cleverly, MD )  4 Days Post-Op    MAJOR 24 HOUR EVENTS     - 4 Days Post-Op s/p open ventral hernia and right inguinal hernia repair with mesh  - Abdomen had new erythema yesterday. Started on Vanc and fluconazole  - Heparin held given drop in Hgb yesterday. Hgb now stabilizing (12.7 --> 9.9 --> 10.6). Restarted this morning with stable Hgb at 10.7  - Pain control still difficult to achieve. Added Toradol to pain regimen given stabilizing Hgb in afternoon. Oxycodone held as patient was very constipated and had severe abdominal pain and made NPO yesterday   - Passed flatus and had a large BM later in the afternoon              - said had 5BM, and passing flatus; RN documented 2BM  - PT saw patient and recommended SNF tentatively  - Drains still with serosanguineous output    MEDICATIONS    All medications have been reviewed in the [facility] and changes reflected in the plan.     FAMILY AND SOCIAL HISTORY    Family and social history were reviewed in the patient's initial history and physical.   All changes are reflected in the plan.     VITAL SIGNS     Current Vitals  Temp: 36.7 C (98.1 F)  Pulse: 68  BP: 104/68  Resp: 16  SpO2: 96 % 24 Hour Vitals (Min/Max)  Temp Max: 37.1 C (98.8 F)  Pulse Min: 64 Max: 80  BP: (95-118)/(62-71)    Resp Min: 12 Max: 21  SpO2 Min: 96 % Max: 100 %     INTAKE AND OUTPUT:  I/O Last 2 Completed Shifts:  In: 2606.1 [Oral:240; Crystalloid:2364.2]  Out: 1955 [Urine:1675; Drains:280]  Weight: 75.6 kg (166 lb 10.7 oz) (09/21/20 7322)     PHYSICAL EXAM     General: No acute distress. Comfortable.  HEENT:  Atraumatic. EtCO2 monitor in place.  Cardiac: Stable HR and BP, symmetric chest rise  Pulmonary: Breathing on RA  Abdomen: soft, tender to palpation, mildly tense to bilateral lower quadrants. JP drains x3 with lighter serosanguineous output.  RLQ with mild erythema. Provena removed, no erythema along incision; after betadine, incision probed with q-tip and no purulent fluid seen  JP1: 80 cc   JP2:135 cc  JP3:65 cc  GU: External catheter in place with light yellow urine  Extremity: Severely contracted bilateral lower extremity, moving upper extremities    RELEVANT LABS STUDIES     Hemoglobin   Date Value Ref Range Status   09/25/2020 10.7 (L) 13.5 - 17.5 g/dL Final   09/24/2020 10.6 (L) 13.5 - 17.5 g/dL Final   09/24/2020 9.9 (L) 13.5 - 17.5 g/dL Final     KUB this AM with improved small and large bowel distension    CLINICAL COURSE, ASSESSMENT, AND PLAN     Roy Henry is a 62yr man with history of MS and severely contracted BLE who is 4 Days Post-Op s/p open ventral hernia repair and right inguinal hernia repair with mesh. He is a functional quadriplegic  and requires total nursing care for ADLs and safety. He is recovering appropriately, but with slow return of bowel function. However, pain is better controlled this morning. We will add oral pain meds in attempt to wean the PCA and encourage more physical activity this morning.     - Adv to limited clears today, may liberalized later today  - Given his history of constipation and MS affecting his bowel function, will continue multimodal bowel care with miralax, colace, senna  - Aspiration precautions as patient is bed-bound at baseline  - Continue PCA, gabapentin, and tylenol with scheduled NSAIDS   -restart low dose oxycodone but encourage NON-narcotics  - Encourage IS and pulmonary hygiene  - Physical therapy consult for in bed exercises and possible OOB to cardiac chair  - Started vanco and Fluconazole (3/10 - TBD)  - With Provena removed, will add a NEW  interdry Ag (in addition to the one tucked in groin) extending from groin to cover the lower abdomen and incision, then covered by ABD pad  - Bacitracin to drain sites TID    Management of intertrigo per wound care:  Apply Interdry Ag textile along groins, skin folds, Conform textile so part of the textile is on direct contact with damaged skin and part of it is exposed to air so it wicks away moisture from skin. Change Interdry Ag evey 5 days. Do not toss old Interdry Ag,it can be washed with water only, and let air dry for re- use for up to 3 months.   Interdry Ag is designed to wick away moisture from skin and it is impregnated with silver for antimicrobial/antifungal effects.    - Will add wound care recommendations:    Apply Mepilex border dressing to sacrum- change every 3 days and PRN for soilage.   Turn and reposition 30 degrees lateral every 2 hours or more frequent and according to patient's medical condition   HOB 30 degrees or less if no contraindications   Continue with waffle mattress   Off load bilateral heels,using WAFFLE boots   Manage moisture,use foley /condom cath /flexiseal or rectal pouch as indicated    Avoid diapers ,except during therapy    Use white breathable pads for better skin microclimate    Continue with efforts to optimize hydration and nutrition    Miscellaneous Care Items:  - Current Diet: Limited clear liquid  - Foley: External foley catheter in place.   - DVT PPX: SCD; Heparin 5000 SQ TID   - PT/OT: Yes, ordered; pending evaluation     DC pending clearance of post-operative milestones.    The patient was seen and discussed with Bariatric Fellow, Particia Jasper, MD .     Electronically signed by:  Gailen Shelter. [place] Surgery PGY1  Bariatric/Foregut Surgery [phone]    Surgery Attending Addendum    I saw and examined the patient and agree with the plan as outlined in the Resident's note. We went over the patient's vital signs, I/Os, exam, labs, and reviewed the  medications as documented in the note. I agree with the resident's findings and have developed a management/recommendation plan as documented in the resident's note. Edits and addendums made.    Electronically signed by:  Shellia Cleverly, MD, MTM  Assistant Professor  Division of Foregut, Metabolic, and General Surgery   Pager [phone]

## 2020-09-24 NOTE — Care Plan (Signed)
Problem: Adult Inpatient Plan of Care  Goal: Plan of Care Review  Outcome: Ongoing, Progressing  Flowsheets (Taken 09/24/2020 0449)  Plan of Care Reviewed With: patient  Progress: improving  Outcome Summary:   VSS,tolerating CLD,total feeder(1:1 assist),abd'ML incision with WV at 125 mmhg,abd'l binder in place,RLQ x 1 JP,LLQ x2 JP's,adequate UOP/condom catheter,lift team turns/ repositions,BLE contracted,on waffle mattress,pain controlled with Dilaudid PCA,Oxy, Gabapentin,Tylenol,Baclofen and Lidocaine patch,PRN Valium for spasms.Bed in lowest locked positioned with call light within reach.

## 2020-09-24 NOTE — Care Plan (Signed)
Respiratory Care Evaluation - Pulmonary Hygiene Pathway Daily Evaluation  Name: BARNIE SOPKO III Admit Date:   09/21/2020  5:32 AM Date of Birth:   Jun 15, 1971     Age: 50 yrs Sex: male      Chief Complaint:   No chief complaint on file.      Date of Initial Respiratory Care Evaluation Order:     Progress Scoring Tool  *Score > 18 indicates readiness to discontinue post-operative chest physiotherapy    Mobility: unable to achieve preoperative mobility status = 0  Breath sounds: slightly decreased breath sounds or presence of minimal adventitia = 2  Secretion clearance: able to clear secretions independently/return = 3  SpO2 at rest and during activity (assessed on baseline/pre-op FiO2): SpO2 >92% (w/o pulmonary hx) or SpO2 >88% (with pulmonary hx) = 3  RR at rest and during activity: within acceptable range for this individual = 3  CXRAY: XRAY WDL for this individual/no Cxray/imaging for 3 days = 2  Incentive spirometry: > 10 mls/kg IBW = 2  Pleural chest tubes: no chest tubes remain = 2    Progress Score: 17    Treatment Plan by RT:   Risk Level: Moderate Risk - Positive pressure therapy; BID for atelectasis, or Acapella Therapy BID for atelectasis and mucokinesis  Modality: PAP  Mouthpiece  Frequency: BID    Treatment Plan by RN:  4x/day deep breathing, I.S., cough/splint, ambulate/chair per pathway.     If patient's condition changes please reorder Respiratory Care Evaluation.      Devona Konig, RT

## 2020-09-24 NOTE — Nurse Assessment (Signed)
ASSESSMENT NOTE    Note Started: 09/24/2020, 10:32     Initial assessment completed and recorded in EMR.  Report received from night shift nurse and orders reviewed. Plan of Care reviewed and appropriate, discussed with patient.  Christeen Douglas, RN RN

## 2020-09-24 NOTE — Nurse Assessment (Signed)
ASSESSMENT NOTE    Note Started: 09/24/2020, 19:17     Initial assessment completed .Report received from day shift nurse and orders reviewed. Plan of Care reviewed and appropriate, discussed with patient.  Louie Bun, RN RN

## 2020-09-24 NOTE — Progress Notes (Signed)
SPECIALTY SURGERY - PROGRESS NOTE  Foregut, Metabolic, and General Surgery     Date of Admission: 09/21/2020 (LOS: 3)  Note Date and Time: 09/24/2020 07:48    OPERATIONS AND PROCEDURES      Procedure(s):  REPAIR, HERNIA, INCISIONAL (N/A)  INSERTION, MESH, ABDOMINAL WALL, ANTERIOR (N/A)  REPAIR, HERNIA, INGUINAL, UNILATERAL (Right)  COMPONENT SEPARATION, ABDOMINAL WALL (Bilateral)  (09/21/2020 - Shellia Cleverly, MD )  3 Days Post-Op    MAJOR 24 HOUR EVENTS     - 3 Days Post-Op s/p open ventral hernia and right inguinal hernia repair with mesh   - Decreased PCA to 0.1/10 and added oxycodone for longer pain relief. Pain still not controlled and abdomen more tender. KUB ordered  - Did not get seen by physical therapy yesterday, but stated he spent 4hr in chair  - Remained on CLD, tolerated well with 920 cc intake  - still no flatus or BM per patient. Suppository ordered.  - Drains still with serosanguineous output    MEDICATIONS    All medications have been reviewed in the [facility] and changes reflected in the plan.     FAMILY AND SOCIAL HISTORY    Family and social history were reviewed in the patient's initial history and physical.   All changes are reflected in the plan.     VITAL SIGNS     Current Vitals  Temp: 36.1 C (96.9 F)  Pulse: 87  BP: 110/76  Resp: 12  SpO2: 96 % 24 Hour Vitals (Min/Max)  Temp Max: 37.2 C (98.9 F)  Pulse Min: 70 Max: 92  BP: (98-115)/(65-76)    Resp Min: 12 Max: 19  SpO2 Min: 92 % Max: 99 %     INTAKE AND OUTPUT:  I/O Last 2 Completed Shifts:  In: 3114.4 [Oral:1160; Crystalloid:1946.6]  Out: 1290 [Urine:1100; Drains:190]  Weight: 75.6 kg (166 lb 10.7 oz) (09/21/20 5956)     PHYSICAL EXAM     General: No acute distress. Comfortable.  HEENT: Atraumatic. EtCO2 monitor in place.  Cardiac: Stable HR and BP, symmetric chest rise  Pulmonary: Breathing on RA  Abdomen: soft, tender to palpation, mildly tense to bilateral lower quadrants. Midline prevena holding suction. JP drains x3 with lighter  serosanguineous output.  RLQ with mild erythema  JP1: 40 cc   JP2:105 cc  JP3:15 cc  GU: External catheter in place with light yellow urine  Extremity: Severely contracted bilateral lower extremity, moving upper extremities    RELEVANT LABS STUDIES     Hemoglobin   Date Value Ref Range Status   09/23/2020 12.7 (L) 13.5 - 17.5 g/dL Final   09/22/2020 11.7 (L) 13.5 - 17.5 g/dL Final   09/22/2020 11.9 (L) 13.5 - 17.5 g/dL Final     CLINICAL COURSE, ASSESSMENT, AND PLAN     LEVI KLAIBER III is a 86yr man with history of MS and severely contracted BLE who is 3 Days Post-Op s/p open ventral hernia repair and right inguinal hernia repair with mesh. He is a functional quadriplegic and requires total nursing care for ADLs and safety. He is recovering appropriately, but with slow return of bowel function. However, pain is better controlled this morning. We will add oral pain meds in attempt to wean the PCA and encourage more physical activity this morning.     - decrease diet to limited sips of clears  - Given his history of constipation and MS affecting his bowel function, will continue multimodal bowel care with miralax, colace, senna,  get a KUB today to evaluate bowel distension and consider mag citrate  - Aspiration precautions as patient is bed-bound at baseline  - Continue PCA while on CLD, gabapentin, and tylenol   - until further return of bowel function, will hold oxycodone  - Encourage IS and pulmonary hygiene  - Physical therapy consult for in bed exercises and possible OOB to cardiac chair  - Started and Ancef and Fluconazole (3/10 - TBD)    Management of intertrigo per wound care:  Apply Interdry Ag textile along groins, skin folds, Conform textile so part of the textile is on direct contact with damaged skin and part of it is exposed to air so it wicks away moisture from skin. Change Interdry Ag evey 5 days. Do not toss old Interdry Ag,it can be washed with water only, and let air dry for re- use for up to 3  months.   Interdry Ag is designed to wick away moisture from skin and it is impregnated with silver for antimicrobial/antifungal effects.    - Will add wound care recommendations:   Apply Mepilex border dressing to sacrum- change every 3 days and PRN for soilage.  Turn and reposition 30 degrees lateral every 2 hours or more frequent and according to patient's medical condition  HOB 30 degrees or less if no contraindications  Continue with waffle mattress  Off load bilateral heels,using WAFFLE boots  Manage moisture,use foley /condom cath /flexiseal or rectal pouch as indicated   Avoid diapers ,except during therapy   Use white breathable pads for better skin microclimate   Continue with efforts to optimize hydration and nutrition    Miscellaneous Care Items:  - Current Diet: Limited clear liquid  - Foley: External foley catheter in place.   - DVT PPX: SCD; Heparin 5000 SQ TID   - PT/OT: Yes, ordered; pending evaluation     DC pending clearance of post-operative milestones.    The patient was seen and discussed with Bariatric Fellow, Particia Jasper, MD .     Electronically signed by:  Gailen Shelter. [facility] PGY1  Bariatric/Foregut Surgery [phone]    Surgery Attending Addendum    I saw and examined the patient and agree with the plan as outlined in the Resident's note. We went over the patient's vital signs, I/Os, exam, labs, and reviewed the medications as documented in the note. I agree with the resident's findings and have developed a management/recommendation plan as documented in the resident's note. Edits and addendums made.    Electronically signed by:  Shellia Cleverly, MD, MTM  Assistant Professor  Division of Foregut, Metabolic, and General Surgery   Pager [phone]

## 2020-09-24 NOTE — Allied Health Consult (Signed)
PM [facility] R -- ACUTE CARE SERVICE    PHYSICAL THERAPY EVALUATION      Name: Roy Henry   MRN: [id number]   Date of Service: 09/24/2020  Hospital Unit: D12  Time In: 1326   Time for Eval: 28 minutes    Was a new therapy order generated with a "Pending Discharge" priority designation?  No. Not applicable--A new PT/OT order was not issued                                                                                                                                                INTAKE INFORMATION AND HISTORY:                       Authorizing Provider and PI #Shellia Cleverly [id number]  Primary Service: (A) Bariatrics/Foregut Surgery   Date of Admission: 09/21/2020  Diagnosis:     Date of Service: 09/21/2020    Pre-Op Diagnosis: Incisional hernia, without obstruction or gangrene [K43.2] and Right inguinal hernia    Post-Op Diagnosis:  Post-Op Diagnosis Codes:     * Incisional hernia, without obstruction or gangrene [K43.2] and Right inguinal hernia    Procedure Performed/Description:  Procedure(s):  REPAIR, HERNIA, INCISIONAL (N/A)  open Rives stoppa repair, CPT [phone]  INSERTION, MESH, ABDOMINAL WALL, ANTERIOR (N/A)  CPT [phone]  REPAIR, HERNIA, INGUINAL, UNILATERAL (Right)  COMPONENT SEPARATION, ABDOMINAL WALL (Bilateral) transversus abdominis release, CPT 15734 x2    Language: English    Precautions: Fall risk  Aspiration  Isolation Precautions: Personal Protective Equipment Utilized: Gloves and Surgical Mask    History of Present Illness/Injury (Including pertinent test results & procedures):    Roy Henry is a 78yr year old man with a history of multiple sclerosis, GERD, HTN, and small bowel obstruction requiring emergent ex lap and resection of small bowel 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 In November 2021, he felt a pop in his abdomen after rolling over. Subsequent CT A/P showed an incisional hernia and he was then referred to surgery clinic. At his  clinic visit in January 2022, patient was also noted to have a right femoral artery thrombosis and a symptomatic right inguinal hernia. Given the symptomatic nature of his incisional and inguinal hernias, surgical intervention was recommended. He is scheduled for an open ventral hernia repair with mesh and possible abdominal wall reconstruction using transversus abdominis muscle release (TAR) and possible open right inguinal hernia repair with mesh.    Of note, he is on rituximab infusions for his multiple sclerosis, along with baclofen and valium. He last got his rituximab infusion in December and he was due for one now, but this has been on hold due to surgery (his neurologist is aware). Since we last saw him in clinic, he  had Omicron in January which led to a 30 pound weight gain. Otherwise, he denies any changes to his health or the addition of new medications since the last visit to our clinic. Patient reports feeling well. Denies fever, chills, nausea, vomiting, diarrhea, chest pain, shortness of breath, dysuria, or other symptoms of recent illness. Patient denies any recent ED visits, medications changes, or hospitalizations.    He did state he gained 30 lb due to o-micron.       Past Medical History:    Past Medical History:   Diagnosis Date    GERD (gastroesophageal reflux disease)     Hypertension     Immobility     bed/wheelchair bound    Multiple sclerosis ([place])          Past Surgical History  Past Surgical History:   Procedure Laterality Date    EXPLORATORY LAPAROTOMY  02/09/2020    SB resection and appendectomy for SBO, complicated by sepsis, SMV thrombosis    EXPLORATORY LAPAROTOMY      SB resection 2nd time          Social History:    Social History     Tobacco Use    Smoking status: Former Smoker    Smokeless tobacco: Never Used    Tobacco comment: stopped 14 yrs ago   Substance Use Topics    Alcohol use: Not Currently    Drug use: Not Currently     Types: Marijuana     Comment: stopped  7 months ago     Caregiver support/availability - will have 24/7 assistance in the SNF.     PHYSICAL THERAPY EXAMINATION:  RN cleared patient to work with PT.      09/24/20 1326   Physical Therapy Time and Intention   Mode of Treatment individual therapy;physical therapy   Total Minutes, Physical Therapy 28   Subjective patient agreeable to tx, RN cleared session   Pain   Additional Documentation Pain Scale: Numbers Pre/Post-Treatment (Group)   Pain Scale: Numbers Pre/Post-Treatment   Pretreatment Pain Rating 6/10   Posttreatment Pain Rating 8/10   Pre/Posttreatment Pain Comment has PCA for pain management   Pain Location - Orientation incisional   Pain Location abdomen   Living Environment   Current Living Arrangements SNF for long term care   Prior Level of Function   Prior Level of Function    (dependent)   Which direction does the pt normally exit bed? n/a   Comment, Prior Level of Function bed-bound   Home Use of Assistive/Adaptive Equipment   Home Equipment Type Adult   Durable Medical Equipment Used at Home   (power scooter)   Comment, Home Equipment in the storage at SNF (unable to use it in the facility)   Limitation/Impairments   Limitations/Impairments - Baseline other (see comments)  (severely contracted BLEs)   Falls   History of Falls in the last 6 months unknown   Observation   General Observations of Patient male, semi-reclined in bed, ongoing PIV, with JP drains, contractures on BLEs   Cognitive   Cognitive Function (Cognitive) WFL   Edema   Comment, Edema +2 pitting edema on both ankle/foor   Sensory Assessment (Somatosensory)   Sensory Assessment (Somatosensory) sensation intact   Range of Motion (ROM)   RLE other (see comments)  (R hip adducted  and internally rotated, flexed knee, ankle dorsiflexed and pronated)   LLE other (see comments)  (hip adducted and internally rotated, flexed knee and ankle dorsiflexed)  Comment: Lower Extremity contractures R > L   Muscle Tone   Comment, Muscle Tone  noted rigidity during PROM   Manual Muscle Testing   RLE other (see comments)   LLE other (see comments)   Comment, MMT: Lower Extremity grossly graded 0/5   Motor Assessment   Neuromuscular Function bilateral;lower extremity;severe impairment   Comment, Vision WFL   Gait Assessment   Gait   (non-ambulatory)   Posture   Comment, Posture not assess   Activity Tolerance   Activity Tolerance fair   Comment, Activity Tolerance limited by pain   Safety Issues, Functional Mobility   Safety Issues Affecting Function (Mobility) friction/shear risk   Impairments Affecting Function (Mobility) strength;range of motion (ROM);pain;muscle tone abnormal;motor control;endurance/activity tolerance;balance;postural/trunk control   Discharge Milestones   No PT Barriers to Discharge True       No additional interventions beyond this evaluation were provided to the patient at this time.      Patient Status after PT session:  Patient tolerated gentle sustained stretching to bilateral LEs, repositioned back in bed and left in the room with his student RN.      ASSESSMENT / RECOMMENDATIONS:    Physical Therapy Assessment:  The patient is a 50yr old male who admitted to [facility] for repair of incisional hernia.  On assessment, noted severe contractures of BLE joints. Per notes, patient is bed-bound and total dependent with care in SNF due to multiple sclerosis and did not show any significant decline in his functional mobility.  Currently at his baseline function and further skilled PT interventions not indicated at this time as no further mobility skills improvement can be expected.  Recommending to utilize Lift Team if patient needs to be transferred to a cardiac chair when medically stable and continue turning schedule with nursing per protocol to promote wound healing/ preventions of  pressure wounds.  No anticipated DME nor therapy needs upon discharge.  Anticipate that patient be discharged back to SNF following medical clearance where he can be provided support/assistance 24/7.      Physical Therapy Recommendations for Nursing:  Assist with re-positioning in bed every two hours with patient activating initial movements.  Assist OOB to cardiac chair dependent slide transfer with Lift Team and increase tolerance as able.       CURRENT Discharge Recommendations:        Disposition: [facility] (SNF)-Custodial        Is Patient Cleared for Disposition above: yes  Current Level of Assistance or Supervision:  Dependent for Total Care  Continued Physical Therapy:  PT at SNF - to establish RNA program to increase ROM to BLE joints                        Equipment Size: N/A        Equipment:  N/A                Referral Recommendations (for follow up): N/A    Patient / Caregiver Education Today:   Physical Therapy Plan of Care/Consent & Recommendations (listed above)      Method of Teaching: Verbal    Learner: Patient    Response: Verbalizes understanding    GOALS / TREATMENT PLAN / FUNCTIONAL PROGNOSIS:  Patient's Goals: to return back to SNF    Physical Therapy Goals:    Patient to present at baseline level of function, indicating no need for further acute PT services. GOAL MET during eval--Pt discharged  from PT    Prognosis:   Patient has no further acute PT needs at this time. Patient discharged from PT (see recommendations above).  Barriers that may interfere with PT POC: Pain      Treatment Plan:    N/A     Recommended Frequency of Treatment: Evaluation only.    Recommended Duration of Treatment: One visit (eval only).    Interim Report Due:  10/08/2020       Patient's Clinical Presentation: Stable/Uncomplexed   The components of this evaluation necessitated a Low complexity level of clinical decision making.     Reported by:  Lilia Pro, PT  Department of Physical Medicine and  [facility] [phone]  Pager/ PT Scheduling [phone]

## 2020-09-25 ENCOUNTER — Inpatient Hospital Stay (HOSPITAL_COMMUNITY): Payer: Medicare Other

## 2020-09-25 DIAGNOSIS — R143 Flatulence: Secondary | ICD-10-CM

## 2020-09-25 LAB — CBC WITH DIFFERENTIAL
Basophils % Auto: 0.2 %
Basophils Abs Auto: 0 10*3/uL (ref 0.0–0.2)
Eosinophils % Auto: 1.8 %
Eosinophils Abs Auto: 0.1 10*3/uL (ref 0.0–0.5)
Hematocrit: 32 % — ABNORMAL LOW (ref 41.0–53.0)
Hemoglobin: 10.7 g/dL — ABNORMAL LOW (ref 13.5–17.5)
Lymphocytes % Auto: 23.6 %
Lymphocytes Abs Auto: 1.3 10*3/uL (ref 1.0–4.8)
MCH: 30.1 pg (ref 27.0–33.0)
MCHC: 33.3 % (ref 32.0–36.0)
MCV: 90.5 fL (ref 80.0–100.0)
MPV: 8.6 fL (ref 6.8–10.0)
Monocytes % Auto: 12.7 %
Monocytes Abs Auto: 0.7 10*3/uL (ref 0.1–0.8)
Neutrophils % Auto: 61.7 %
Neutrophils Abs Auto: 3.4 10*3/uL (ref 1.8–7.7)
Platelet Count: 219 10*3/uL (ref 130–400)
RDW: 13.1 % (ref 0.0–14.7)
Red Blood Cell Count: 3.54 10*6/uL — ABNORMAL LOW (ref 4.50–5.90)
White Blood Cell Count: 5.5 10*3/uL (ref 4.5–11.0)

## 2020-09-25 LAB — BASIC METABOLIC PANEL
Calcium: 8.5 mg/dL — ABNORMAL LOW (ref 8.6–10.0)
Carbon Dioxide Total: 24 mmol/L (ref 22–29)
Chloride: 104 mmol/L (ref 98–107)
Creatinine Serum: 0.42 mg/dL — ABNORMAL LOW (ref 0.51–1.17)
E-GFR Creatinine (Male): >100 mL/min/{1.73_m2}
Glucose: 95 mg/dL (ref 74–109)
Potassium: 4 mmol/L (ref 3.4–5.1)
Sodium: 137 mmol/L (ref 136–145)
Urea Nitrogen, Blood (BUN): 4 mg/dL — ABNORMAL LOW (ref 6–20)

## 2020-09-25 LAB — MAGNESIUM (MG): Magnesium (Mg): 1.6 mg/dL (ref 1.6–2.4)

## 2020-09-25 LAB — PHOSPHORUS (PO4): Phosphorus (PO4): 2.8 mg/dL (ref 2.5–4.5)

## 2020-09-25 MED ORDER — NALOXONE 0.4 MG/ML INJECTION SOLUTION: 0.1000 mg | INTRAMUSCULAR | Status: DC | PRN

## 2020-09-25 MED ORDER — BACITRACIN 500 UNIT/GRAM TOPICAL OINTMENT
TOPICAL_OINTMENT | Freq: Three times a day (TID) | TOPICAL | Status: DC
Start: 2020-09-25 — End: 2020-09-29
  Filled 2020-09-25: qty 28.4

## 2020-09-25 MED ORDER — OXYCODONE 5 MG TABLET
10.0000 mg | ORAL_TABLET | ORAL | Status: DC | PRN
Start: 2020-09-25 — End: 2020-09-29
  Administered 2020-09-28 – 2020-09-29 (×7): 10 mg via ORAL
  Filled 2020-09-25 (×7): qty 2

## 2020-09-25 MED ORDER — ACETAMINOPHEN 500 MG TABLET
1000.0000 mg | ORAL_TABLET | Freq: Four times a day (QID) | ORAL | Status: DC
Start: 2020-09-25 — End: 2020-09-29
  Administered 2020-09-26 – 2020-09-29 (×10): 1000 mg via ORAL
  Filled 2020-09-25 (×14): qty 2

## 2020-09-25 MED ORDER — MAGNESIUM SULFATE 2 GRAM/50 ML (4 %) IN WATER INTRAVENOUS PIGGYBACK
2.0000 g | INJECTION | Freq: Once | INTRAVENOUS | Status: AC
Start: 2020-09-25 — End: 2020-09-25
  Administered 2020-09-25: 2 g via INTRAVENOUS
  Filled 2020-09-25: qty 50

## 2020-09-25 MED ORDER — OXYCODONE 5 MG TABLET
5.0000 mg | ORAL_TABLET | ORAL | Status: DC | PRN
Start: 2020-09-25 — End: 2020-09-29
  Filled 2020-09-25 (×2): qty 1

## 2020-09-25 MED ORDER — OXYCODONE 5 MG TABLET
5.0000 mg | ORAL_TABLET | ORAL | Status: DC | PRN
Start: 2020-09-25 — End: 2020-09-29
  Administered 2020-09-28 – 2020-09-29 (×3): 5 mg via ORAL
  Filled 2020-09-25: qty 1

## 2020-09-25 MED ORDER — IBUPROFEN 600 MG TABLET
600.0000 mg | ORAL_TABLET | Freq: Three times a day (TID) | ORAL | Status: DC
Start: 2020-09-25 — End: 2020-09-29
  Administered 2020-09-26: 600 mg via ORAL
  Filled 2020-09-25 (×6): qty 1

## 2020-09-25 NOTE — Nurse Assessment (Signed)
ASSESSMENT NOTE    Note Started: 09/25/2020, 19:00     Initial assessment completed and recorded in EMR.  Report received from day shift nurse and orders reviewed. Plan of Care reviewed and appropriate, discussed with patient.  Armando Reichert, RN

## 2020-09-25 NOTE — Nurse Focus (Signed)
Offered to watch Opioid video but patient refused. Patient stated he already watched it 10x. Christeen Douglas, RN

## 2020-09-25 NOTE — Clinical Case Management (Signed)
Clinical Case Management Assessments    Name: Roy Henry  MRN: [id number]   Date of Birth: [birth year] (78yr) Gender: male    Note Date: 09/25/2020 Note Time: 09:49       DISCHARGE PLANNING NOTE    Patient was transferred from Networked reference to record EAF .  Takeback Letter: Not Applicable    Permanent Address: [address]  [place] [address]  Discharge Address: SNF - [facility]: [address], [place], [place] [address]    Patient can follow-up with:   PCP: Patient, No Pcp Per  / Phone Number: None  Preferred Pharmacy: [facility], [place], [phone] [facility]    Funding/Billing: Payor: MEDICARE / Plan: MEDICARE PART A&B / Product Type: *No Product type* /    Secondary Insurance: [employer] [facility]    Reason for admission: 09/21/2020 PROCEDURE: open ventral hernia and right inguinal hernia repair with mesh    Patient able to participate in plan?: Yes  Living arrangements: Other (comment)  Type of Residence: skilled nursing facility  Home Environment (floors/stairs): [facility] [phone]  Who will be the primary caregiver after discharge?  Phone #? : Burke Keels [phone]  Pre-Hospitalization self-care deficits: Bathing, Toileting, Eating  Pre-Hospitalization mobility: Dependent severe assistance required  Type of home health care services in place: None  Anticipated home health care services: None  Anticipated Home Infusion: None  DME in place: None  Anticipated DME: None  Patient's goal upon discharge (in patient's own words) : back to [facility]  Anticipated Disposition: Home  Has provider discussed post-acute care options with the patient?: Yes  Does the patient have ongoing DC Planning needs?: Yes  Is the patient ready for discharge today?: No  Anticipated Barriers to Discharge: None    SNF Facility Name: [facility]  Ph: [phone]    [address], [place], [place] [address]  The list of accepting  post-acute providers/ the pt choice letter and the quality rating information has been provided to the pt/ or designated decision maker.    Comments:   Reviewed allscripts for response from [facility] - no response yet  TC to admissions this AM, [phone], left voicemail requesting to review referral and update response or call CM back with confirmation of admission.    CM to follow.     Follow-up/recommendations:  DC back to [facility]      Date/Time: 09/25/2020 09:49  Electronically Signed by:   Fredirick Lathe, Case Manager  Pager: [phone]

## 2020-09-25 NOTE — Nurse Assessment (Signed)
ASSESSMENT NOTE    Note Started: 09/25/2020, 09:41     Initial assessment completed and recorded in EMR.  Report received from night shift nurse and orders reviewed. Plan of Care reviewed and appropriate, discussed with patient.  Christeen Douglas, RN RN

## 2020-09-25 NOTE — Care Plan (Signed)
Problem: Adult Inpatient Plan of Care  Goal: Plan of Care Review  Outcome: Ongoing, Progressing  Flowsheets (Taken 09/25/2020 1709)  Plan of Care Reviewed With: patient  Progress: improving  Outcome Summary: vss, abd wound vac dc'd - abd dressing CDI. L JP x2 and R JP x1 with serosang fluid. Started on clears with 530ml limit per shift. IVF + PCA dilaudid for pain control. Cont on IV abx. No BM today, condom catheter applied with good UOP, turns with lift team.

## 2020-09-25 NOTE — Progress Notes (Signed)
SPECIALTY SURGERY - PROGRESS NOTE  Foregut, Metabolic, and General Surgery     Date of Admission: 09/21/2020 (LOS: 5)  Note Date and Time: 09/26/2020 09:40    OPERATIONS AND PROCEDURES      Procedure(s):  REPAIR, HERNIA, INCISIONAL (N/A)  INSERTION, MESH, ABDOMINAL WALL, ANTERIOR (N/A)  REPAIR, HERNIA, INGUINAL, UNILATERAL (Right)  COMPONENT SEPARATION, ABDOMINAL WALL (Bilateral)  (09/21/2020 - Shellia Cleverly, MD )  5 Days Post-Op    MAJOR 24 HOUR EVENTS     - 5 Days Post-Op s/p open ventral hernia and right inguinal hernia repair with mesh  - Erythema to abdomen much improved   - Tolerating clears  - Took in 660 cc PO, denies nausea, vomiting  - No BM   - Prevena removed and midline incision probed, no evidence of purulent drainage or other infection, Interdry Ag placed to lower abdomen  - patient reports he takes his Valium q6 hours scheduled, not PRN  - requesting scheduled narcotics, does not want to ask for pain meds    MEDICATIONS    All medications have been reviewed in the [facility] and changes reflected in the plan.     FAMILY AND SOCIAL HISTORY    Family and social history were reviewed in the patient's initial history and physical.   All changes are reflected in the plan.     VITAL SIGNS     Current Vitals  Temp: 36.7 C (98.1 F)  Pulse: 73  BP: 106/62  Resp: 16  SpO2: 98 % 24 Hour Vitals (Min/Max)  Temp Max: 37.1 C (98.8 F)  Pulse Min: 73 Max: 91  BP: (106-124)/(62-78)    Resp Min: 10 Max: 16  SpO2 Min: 97 % Max: 98 %     INTAKE AND OUTPUT:  I/O Last 2 Completed Shifts:  In: 2765 [Oral:660; Crystalloid:2102.6]  Out: 1655 [Urine:1500; Drains:155]  Weight: 75.6 kg (166 lb 10.7 oz) (09/21/20 6144)     PHYSICAL EXAM     General: No acute distress. Comfortable.  Cardiac: Stable HR and BP, symmetric chest rise  Pulmonary: Breathing on RA  Abdomen: soft, tender to palpation, mildly tense to bilateral lower quadrants. JP drains x3 with light serosanguineous output.  Abdominal wall erythema much improved. Incision clean  and intact, closed with staples, no erythema along incision, no purulent fluid draining from incision  JP1: 80 cc   JP2: 55 cc  JP3: 20 cc  GU: External catheter in place with light yellow urine  Extremity: Severely contracted bilateral lower extremity, moving upper extremities    RELEVANT LABS STUDIES     Hemoglobin   Date Value Ref Range Status   09/26/2020 10.4 (L) 13.5 - 17.5 g/dL Final   09/25/2020 10.7 (L) 13.5 - 17.5 g/dL Final   09/24/2020 10.6 (L) 13.5 - 17.5 g/dL Final       CLINICAL COURSE, ASSESSMENT, AND PLAN     Roy Henry is a 45yr man with history of MS and severely contracted BLE who is 5 Days Post-Op s/p open ventral hernia repair and right inguinal hernia repair with mesh. He is a functional quadriplegic and requires total nursing care for ADLs and safety. He is recovering appropriately, but with slow return of bowel function. We will add oral pain meds in attempt to wean the PCA and encourage more physical activity this morning.     - Advance to FLD diet today   - Given his history of constipation and MS affecting his bowel function, will  continue multimodal bowel care with miralax, colace, senna  - Aspiration precautions as patient is bed-bound at baseline  - Continue PCA until noon, then will discuss discontinuing and using PRN oxycodone and dilaudid; continue gabapentin, and tylenol with scheduled NSAIDS   - Continue low dose PRN oxycodone and dilaudid but encourage NON-narcotics  - Encourage IS and pulmonary hygiene  - Physical therapy for in bed exercises and possible OOB to cardiac chair  - Continue vanco and Fluconazole (3/10 - TBD) for abdominal wall cellulitis, improving.  - Continue placing interdry Ag (in addition to the one tucked in groin) extending from groin to cover the lower abdomen and incision, then covered by ABD pad  - Bacitracin to drain sites TID    Management of intertrigo per wound care:  Apply Interdry Ag textile along groins, skin folds, Conform textile so part  of the textile is on direct contact with damaged skin and part of it is exposed to air so it wicks away moisture from skin. Change Interdry Ag evey 5 days. Do not toss old Interdry Ag,it can be washed with water only, and let air dry for re- use for up to 3 months.   Interdry Ag is designed to wick away moisture from skin and it is impregnated with silver for antimicrobial/antifungal effects.     Apply Mepilex border dressing to sacrum- change every 3 days and PRN for soilage.  Turn and reposition 30 degrees lateral every 2 hours or more frequent and according to patient's medical condition  HOB 30 degrees or less if no contraindications  Continue with waffle mattress  Off load bilateral heels,using WAFFLE boots  Manage moisture,use foley /condom cath /flexiseal or rectal pouch as indicated   Avoid diapers ,except during therapy   Use white breathable pads for better skin microclimate   Continue with efforts to optimize hydration and nutrition    Miscellaneous Care Items:  - Current Diet: FLD  - Foley: External foley catheter in place.   - DVT PPX: SCD; Heparin 5000 SQ TID   - PT/OT: Final recommendations given  Custodial SNF     DC pending clearance of post-operative milestones.    The patient was seen and discussed with attending surgeon Dr. Marisa Henry.    Electronically signed by:  Asencion Gowda, MD MAS  General Surgery Resident, Lin Landsman, [place], [facility] [phone]      Surgery Staff Note  This patient was seen, examined, evaluated, and care plan was developed with the resident. Details of our E&M as outlined in the resident's note are reviewed, verified and updated or edited.  I agree with the findings and plan we developed.      Electronically signed by  Roy Greener, MD  Attending  Dep of General Surgery  [phone]

## 2020-09-25 NOTE — Care Plan (Signed)
Respiratory Care Evaluation - Pulmonary Hygiene Pathway Daily Evaluation  Name: Roy Henry Admit Date:   09/21/2020  5:32 AM Date of Birth:   06-27-1971     Age: 50 yrs Sex: male      Chief Complaint:   No chief complaint on file.    Progress Scoring Tool  *Score > 18 indicates readiness to discontinue post-operative chest physiotherapy    Mobility: able to achieve preoperative mobility status = 3  Breath sounds: slightly decreased breath sounds or presence of minimal adventitia = 2  Secretion clearance: able to clear secretions independently/return = 3  SpO2 at rest and during activity (assessed on baseline/pre-op FiO2): SpO2 >92% (w/o pulmonary hx) or SpO2 >88% (with pulmonary hx) = 3  RR at rest and during activity: within acceptable range for this individual = 3  CXRAY: XRAY WDL for this individual/no Cxray/imaging for 3 days = 2  Incentive spirometry: > 10 mls/kg IBW = 2  Pleural chest tubes: no chest tubes remain = 2    Progress Score: 20    Treatment Plan by RT:   Risk Level: Low Risk - no RT intervention indicated per Pulmonary Hygiene Pathway, see Treatment Plan by RN below  Modality: No Intervention indicated per Pulmonary Hygiene Pathway, see Treatment Plan by RN below  Frequency: None    Treatment Plan by RN:  4x/day deep breathing, I.S., cough/splint, ambulate/chair per pathway.     If patient's condition changes please reorder Respiratory Care Evaluation.      Leslie Andrea, RT

## 2020-09-26 DIAGNOSIS — Z8719 Personal history of other diseases of the digestive system: Secondary | ICD-10-CM

## 2020-09-26 DIAGNOSIS — Z9889 Other specified postprocedural states: Secondary | ICD-10-CM

## 2020-09-26 LAB — BASIC METABOLIC PANEL
Calcium: 8.6 mg/dL (ref 8.6–10.0)
Carbon Dioxide Total: 23 mmol/L (ref 22–29)
Chloride: 102 mmol/L (ref 98–107)
Creatinine Serum: 0.46 mg/dL — ABNORMAL LOW (ref 0.51–1.17)
E-GFR Creatinine (Male): >100 mL/min/1.73m*2
Glucose: 104 mg/dL (ref 74–109)
Potassium: 3.9 mmol/L (ref 3.4–5.1)
Sodium: 137 mmol/L (ref 136–145)
Urea Nitrogen, Blood (BUN): 3 mg/dL — ABNORMAL LOW (ref 6–20)

## 2020-09-26 LAB — CBC WITH DIFFERENTIAL
Basophils % Auto: 0.3 %
Basophils Abs Auto: 0 K/MM3 (ref 0.0–0.2)
Eosinophils % Auto: 1.7 %
Eosinophils Abs Auto: 0.1 K/MM3 (ref 0.0–0.5)
Hematocrit: 32 % — ABNORMAL LOW (ref 41.0–53.0)
Hemoglobin: 10.4 g/dL — ABNORMAL LOW (ref 13.5–17.5)
Lymphocytes % Auto: 26.1 %
Lymphocytes Abs Auto: 1.3 K/MM3 (ref 1.0–4.8)
MCH: 29.1 pg (ref 27.0–33.0)
MCHC: 32.6 % (ref 32.0–36.0)
MCV: 89.2 fL (ref 80.0–100.0)
MPV: 8.2 fL (ref 6.8–10.0)
Monocytes % Auto: 11.7 %
Monocytes Abs Auto: 0.6 K/MM3 (ref 0.1–0.8)
Neutrophils % Auto: 60.2 %
Neutrophils Abs Auto: 3 K/MM3 (ref 1.8–7.7)
Platelet Count: 253 K/MM3 (ref 130–400)
RDW: 13 % (ref 0.0–14.7)
Red Blood Cell Count: 3.59 M/MM3 — ABNORMAL LOW (ref 4.50–5.90)
White Blood Cell Count: 4.9 K/MM3 (ref 4.5–11.0)

## 2020-09-26 LAB — PHOSPHORUS (PO4): Phosphorus (PO4): 3.1 mg/dL (ref 2.5–4.5)

## 2020-09-26 LAB — MAGNESIUM (MG): Magnesium (Mg): 1.7 mg/dL (ref 1.6–2.4)

## 2020-09-26 MED ORDER — BACITRACIN 500 UNIT/GRAM TOPICAL OINTMENT
TOPICAL_OINTMENT | Freq: Two times a day (BID) | TOPICAL | Status: DC
Start: 2020-09-26 — End: 2020-09-29
  Filled 2020-09-26 (×2): qty 28.4

## 2020-09-26 MED ORDER — HYDROMORPHONE (PF) 50 MG/50 ML PCA SYRINGE (DISCRETE DOSE) - ADULT
INTRAVENOUS | Status: DC
Start: 2020-09-26 — End: 2020-09-26

## 2020-09-26 MED ORDER — DIAZEPAM 5 MG TABLET
5.0000 mg | ORAL_TABLET | Freq: Four times a day (QID) | ORAL | Status: DC
  Administered 2020-09-26 – 2020-09-29 (×13): 5 mg via ORAL
  Filled 2020-09-26 (×13): qty 1

## 2020-09-26 MED ORDER — NALOXONE 0.4 MG/ML INJECTION SOLUTION: 0.1000 mg | INTRAMUSCULAR | Status: DC | PRN

## 2020-09-26 MED ORDER — MAGNESIUM SULFATE 2 GRAM/50 ML (4 %) IN WATER INTRAVENOUS PIGGYBACK
2.0000 g | INJECTION | Freq: Once | INTRAVENOUS | Status: AC
Start: 2020-09-26 — End: 2020-09-26
  Administered 2020-09-26: 2 g via INTRAVENOUS
  Filled 2020-09-26: qty 50

## 2020-09-26 MED ORDER — HYDROMORPHONE 1 MG/ML INJECTION SYRINGE
0.2000 mg | INJECTION | INTRAMUSCULAR | Status: DC | PRN
Start: 2020-09-26 — End: 2020-09-27
  Administered 2020-09-27 (×3): 0.2 mg via INTRAVENOUS
  Filled 2020-09-26 (×4): qty 1

## 2020-09-26 NOTE — Care Plan (Signed)
Problem: Adult Inpatient Plan of Care  Goal: Plan of Care Review  Outcome: Ongoing, Not Progressing  Flowsheets (Taken 09/26/2020 1624)  Outcome Summary: AAOx4, VSS, tolerating full liquid diet, no BM but passing gas, RLQ JPx1, LLQ JPx2, condom catheter in place, lift team turns/repositions, BLE contracted, on waffle mattress. Patient refusing scheduled tylenol and ibuprofin and prefers to not take narcotics. Takes gabapentin, baclofen and valium for spasms.

## 2020-09-26 NOTE — Nurse Assessment (Signed)
ASSESSMENT NOTE    Note Started: 09/26/2020, 07:35     Initial assessment completed and recorded in EMR.  Report received from night shift nurse and orders reviewed. Plan of Care reviewed and appropriate, discussed with patient.  Genevie Ann, RN RN

## 2020-09-26 NOTE — Nurse Assessment (Signed)
ASSESSMENT NOTE    Note Started: 09/26/2020, 19:29     Initial assessment completed and recorded in EMR.  Report received from day shift nurse and orders reviewed. Plan of Care reviewed and updated, discussed with patient.  Brunilda Payor, RN

## 2020-09-26 NOTE — Care Plan (Signed)
Problem: Adult Inpatient Plan of Care  Goal: Plan of Care Review  Outcome: Ongoing, Progressing  Flowsheets (Taken 09/26/2020 0640)  Plan of Care Reviewed With: patient  Progress: improving  Outcome Summary: AO x4, VSS,tolerating CLD, assist w/feeing, RLQ x 1 JP,LLQ x2  adequate UOP via condom catheter, lift team turns/repositions, BLE-contracted,on waffle mattress,pain controlled with Dilaudid PCA, scheduled Gabapentin,Tylenol,Baclofen and Lidocaine patch, PRN Valium for spasms.

## 2020-09-27 LAB — CBC WITH DIFFERENTIAL
Basophils % Auto: 0.5 %
Basophils Abs Auto: 0 10*3/uL (ref 0.0–0.2)
Eosinophils % Auto: 1.5 %
Eosinophils Abs Auto: 0.1 10*3/uL (ref 0.0–0.5)
Hematocrit: 31.4 % — ABNORMAL LOW (ref 41.0–53.0)
Hemoglobin: 10.7 g/dL — ABNORMAL LOW (ref 13.5–17.5)
Lymphocytes % Auto: 24.4 %
Lymphocytes Abs Auto: 1.4 10*3/uL (ref 1.0–4.8)
MCH: 30.6 pg (ref 27.0–33.0)
MCHC: 33.9 % (ref 32.0–36.0)
MCV: 90.3 fL (ref 80.0–100.0)
MPV: 7.8 fL (ref 6.8–10.0)
Monocytes % Auto: 10.8 %
Monocytes Abs Auto: 0.6 10*3/uL (ref 0.1–0.8)
Neutrophils % Auto: 62.8 %
Neutrophils Abs Auto: 3.7 10*3/uL (ref 1.8–7.7)
Platelet Count: 266 10*3/uL (ref 130–400)
RDW: 13.2 % (ref 0.0–14.7)
Red Blood Cell Count: 3.48 10*6/uL — ABNORMAL LOW (ref 4.50–5.90)
White Blood Cell Count: 5.9 10*3/uL (ref 4.5–11.0)

## 2020-09-27 LAB — BASIC METABOLIC PANEL
Calcium: 8.8 mg/dL (ref 8.6–10.0)
Carbon Dioxide Total: 25 mmol/L (ref 22–29)
Chloride: 104 mmol/L (ref 98–107)
Creatinine Serum: 0.48 mg/dL — ABNORMAL LOW (ref 0.51–1.17)
E-GFR Creatinine (Male): >100 mL/min/{1.73_m2}
Glucose: 106 mg/dL (ref 74–109)
Potassium: 3.7 mmol/L (ref 3.4–5.1)
Sodium: 140 mmol/L (ref 136–145)
Urea Nitrogen, Blood (BUN): 5 mg/dL — ABNORMAL LOW (ref 6–20)

## 2020-09-27 LAB — MAGNESIUM (MG): Magnesium (Mg): 1.9 mg/dL (ref 1.6–2.4)

## 2020-09-27 LAB — VANCOMYCIN, TROUGH: Vancomycin, Trough: 9 µg/mL (ref 5.0–10.0)

## 2020-09-27 LAB — PHOSPHORUS (PO4): Phosphorus (PO4): 3.3 mg/dL (ref 2.5–4.5)

## 2020-09-27 LAB — VANCOMYCIN, PEAK: Vancomycin, Peak: 26.4 µg/mL (ref 20.0–40.0)

## 2020-09-27 MED ORDER — MAGNESIUM SULFATE 2 GRAM/50 ML (4 %) IN WATER INTRAVENOUS PIGGYBACK
2.0000 g | INJECTION | Freq: Once | INTRAVENOUS | Status: AC
Start: 2020-09-27 — End: 2020-09-27
  Administered 2020-09-27: 2 g via INTRAVENOUS
  Filled 2020-09-27: qty 50

## 2020-09-27 MED ORDER — HYDROMORPHONE 1 MG/ML INJECTION SYRINGE
0.2000 mg | INJECTION | INTRAMUSCULAR | Status: DC | PRN
Start: 2020-09-27 — End: 2020-09-28
  Administered 2020-09-27 – 2020-09-28 (×3): 0.2 mg via INTRAVENOUS
  Filled 2020-09-27 (×3): qty 1

## 2020-09-27 MED ORDER — POTASSIUM CHLORIDE 10 MEQ/100ML IN STERILE WATER INTRAVENOUS PIGGYBACK
10.0000 meq | INJECTION | INTRAVENOUS | Status: AC
Start: 2020-09-27 — End: 2020-09-27
  Administered 2020-09-27: 10 meq via INTRAVENOUS
  Filled 2020-09-27: qty 100

## 2020-09-27 MED ORDER — POTASSIUM CHLORIDE 10 MEQ/100ML IN STERILE WATER INTRAVENOUS PIGGYBACK
10.0000 meq | INJECTION | INTRAVENOUS | Status: AC
Start: 2020-09-27 — End: 2020-09-27
  Administered 2020-09-27 (×3): 10 meq via INTRAVENOUS
  Filled 2020-09-27 (×3): qty 100

## 2020-09-27 NOTE — Nurse Assessment (Signed)
ASSESSMENT NOTE    Note Started: 09/27/2020, 19:29     Initial assessment completed and recorded in EMR.  Report received from day shift nurse and orders reviewed. Plan of Care reviewed and appropriate, discussed with patient.  Armando Reichert, RN

## 2020-09-27 NOTE — Nurse Focus (Signed)
Pt c/o of wrist soreness due to hand being stuck in a weird position while asleep. Right wrist wrapped in an ace bandage for support and comfort per pt's request.

## 2020-09-27 NOTE — Progress Notes (Addendum)
PHARMACIST VANCOMYCIN DOSING ASSESSMENT    Patient Name: Roy Henry  MRN: [id number]    Vancomycin Indication: SSTI surgical site  Day # 4 Vancomycin Therapy      AUC goal 400 - 600 mg*hr/L    OBJECTIVE:   Allergies   Allergen Reactions    Tegretol [Carbamazepine] Unknown-Explain in Comments     unknown      BP 126/75   Pulse 74   Temp 36.9 C (98.4 F) (Oral)   Resp 14   Ht 1.854 m (6\' 1" )   Wt 75.6 kg (166 lb 10.7 oz)   SpO2 96%   BMI 21.99 kg/m      Temp (24hrs), Avg:36.8 C (98.2 F), Min:36.7 C (98 F), Max:36.9 C (98.4 F)     LABS:  Recent labs for the past 72 hours     09/25/20  0624 09/26/20  0709 09/27/20  0343   WBC 5.5 4.9 5.9   BUN 4* 3* 5*   CR 0.42* 0.46* 0.48*      Estimated Creatinine Clearance: 196.9 mL/min (A) (by C-G formula based on SCr of 0.48 mg/dL (L)).     Lab Results   Lab Name Value Date/Time    VANCOTR 9.0 09/27/2020 03:43 AM        Lab Results   Lab Name Value Date/Time    VANCOPK 26.4 09/27/2020 06:20 AM          ASSESSMENT / PLAN:   Based on most recent levels as seen above (therapeutic) on regimen of 1000mg  Q12H, will continue current dose.    Calculated AUC: 399    Dose adjustment by pharmacist per hospital policy and procedures. Will continue to follow along for any additional dose adjustments as needed.      Signed By:   Bea Laura, PharmD, [facility]  Clinical Pharmacist - Cardiology, Nutrition Support  [facility] [phone]) or [employer]    Department of Pharmacy Services

## 2020-09-27 NOTE — Care Plan (Signed)
Problem: Adult Inpatient Plan of Care  Goal: Plan of Care Review  Outcome: Ongoing, Progressing  Flowsheets (Taken 09/27/2020 0406)  Plan of Care Reviewed With: patient  Progress: no change  Outcome Summary: AAOx4, VSS, tolerating full liquid diet. No BM but passing gas. RLQ x1 JP, LLQ x2 JP, adequate UOP via condom catheter. Lift team turns/repostions. BLE contracted, on waffle matress. Pt refused turns after midnight due to increased pain, pt continues to refuse all pain medication except tylenol. Charge nurse and MD updated on situation and pt offered PRN pain medication several times but they denied. Q6 valium for muscle spasms. Vanco trough drawn and sent. Pt right wrist wrapped in ace wrap per pt request due to soreness.

## 2020-09-27 NOTE — Nurse Assessment (Signed)
ASSESSMENT NOTE    Note Started: 09/27/2020, 07:55     Initial assessment completed and recorded in EMR.  Report received from night shift nurse and orders reviewed. Plan of Care reviewed and updated, discussed with patient.  Zollie Pee, RN

## 2020-09-27 NOTE — Care Plan (Signed)
Problem: Adult Inpatient Plan of Care  Goal: Plan of Care Review  Outcome: Ongoing, Progressing  Flowsheets (Taken 09/27/2020 1515)  Plan of Care Reviewed With: patient  Progress: improving  Outcome Summary:   VSS. Abd incision CDI   JP x 3 intact w/ dressing changed. Repositioned with lift team. Good PO intake. CHG bath done. Large BM x1. Voids well per condom cath. K, Mg replaced. Plan for SNF placement.

## 2020-09-27 NOTE — Nurse Focus (Signed)
Pt is is refusing all pain medication other than scheduled tylenol despite being in visible pain. Pt expresses 10/10 pain but refuses pain medication due until he is "able to talk to my surgeon." Pt is also refusing turns due to pain high pain levels. The charge nurse went and talked with the pt and MD [facility] notified.

## 2020-09-27 NOTE — Progress Notes (Signed)
SPECIALTY SURGERY - PROGRESS NOTE  Foregut, Metabolic, and General Surgery     Date of Admission: 09/21/2020 (LOS: 7)  Note Date and Time: 09/28/2020 07:51    OPERATIONS AND PROCEDURES      Procedure(s):  REPAIR, HERNIA, INCISIONAL (N/A)  INSERTION, MESH, ABDOMINAL WALL, ANTERIOR (N/A)  REPAIR, HERNIA, INGUINAL, UNILATERAL (Right)  COMPONENT SEPARATION, ABDOMINAL WALL (Bilateral)  (09/21/2020 - Shellia Cleverly, MD )  7 Days Post-Op    MAJOR 24 HOUR EVENTS     - 7 Days Post-Op s/p open ventral hernia and right inguinal hernia repair with mesh  - Tolerating FLD, took in 1.6L PO wo nausea or vomitting  - 3 BM, one was large and formed yesterday  - Was previously refusing prn pain medications but now amenable to IV medications   - now has scheduled valium    MEDICATIONS    All medications have been reviewed in the [facility] and changes reflected in the plan.     FAMILY AND SOCIAL HISTORY    Family and social history were reviewed in the patient's initial history and physical.   All changes are reflected in the plan.     VITAL SIGNS     Current Vitals  Temp: 36.8 C (98.2 F)  Pulse: 72  BP: 113/75  Resp: 14  SpO2: 96 % 24 Hour Vitals (Min/Max)  Temp Max: 36.9 C (98.4 F)  Pulse Min: 58 Max: 78  BP: (98-126)/(57-76)    Resp Min: 14 Max: 16  SpO2 Min: 96 % Max: 100 %     INTAKE AND OUTPUT:  I/O Last 2 Completed Shifts:  In: 2341.6 [Oral:1602; Crystalloid:739.6]  Out: 2298 [Urine:2200; Drains:98]  Weight: 75.6 kg (166 lb 10.7 oz) (09/21/20 2229)     PHYSICAL EXAM     General: No acute distress. Comfortable.  Cardiac: Stable HR and BP, symmetric chest rise  Pulmonary: Breathing on RA  Abdomen: soft, tender to palpation, mildly tense to bilateral lower quadrants. JP drains x3 with light serosanguineous output. Erythema is now minimal. Incision clean and intact, closed with staples, no erythema along incision, no purulent fluid draining from incision  JP1: 50 cc   JP2: 35 cc  JP3: 10 cc  GU: External catheter in place with light yellow  urine  Extremity: Severely contracted bilateral lower extremity, moving upper extremities    RELEVANT LABS STUDIES     Hemoglobin   Date Value Ref Range Status   09/28/2020 10.5 (L) 13.5 - 17.5 g/dL Final   09/27/2020 10.7 (L) 13.5 - 17.5 g/dL Final   09/26/2020 10.4 (L) 13.5 - 17.5 g/dL Final       CLINICAL COURSE, ASSESSMENT, AND PLAN     Roy Henry is a 61yr man with history of MS and severely contracted BLE who is 7 Days Post-Op s/p open ventral hernia repair and right inguinal hernia repair with mesh. He is a functional quadriplegic and requires total nursing care for ADLs and safety. He is recovering appropriately, but with slow return of bowel function. We will add oral pain meds in attempt to wean the PCA and encourage more physical activity this morning.     - Consider advancing diet to regular diet  - Given his history of constipation and MS affecting his bowel function, will continue multimodal bowel care with miralax, colace, senna  - Aspiration precautions as patient is bed-bound at baseline  - PCA discontinued and using PRN oxycodone and dilaudid; continue gabapentin, and tylenol with scheduled  NSAIDS   - Continue low dose PRN oxycodone and dilaudid but encourage NON-narcotics  - Encourage IS and pulmonary hygiene  - Physical therapy for in bed exercises and possible OOB to cardiac chair  - Continue vanco and Fluconazole (3/10 - TBD) for abdominal wall cellulitis, improving.  - Continue placing interdry Ag (in addition to the one tucked in groin) extending from groin to cover the lower abdomen and incision, then covered by ABD pad  - Bacitracin to drain sites TID    Management of intertrigo per wound care:  Apply Interdry Ag textile along groins, skin folds, Conform textile so part of the textile is on direct contact with damaged skin and part of it is exposed to air so it wicks away moisture from skin. Change Interdry Ag evey 5 days. Do not toss old Interdry Ag,it can be washed with water only,  and let air dry for re- use for up to 3 months.   Interdry Ag is designed to wick away moisture from skin and it is impregnated with silver for antimicrobial/antifungal effects.     Apply Mepilex border dressing to sacrum- change every 3 days and PRN for soilage.  Turn and reposition 30 degrees lateral every 2 hours or more frequent and according to patient's medical condition  HOB 30 degrees or less if no contraindications  Continue with waffle mattress  Off load bilateral heels,using WAFFLE boots  Manage moisture,use foley /condom cath /flexiseal or rectal pouch as indicated   Avoid diapers ,except during therapy   Use white breathable pads for better skin microclimate   Continue with efforts to optimize hydration and nutrition    Miscellaneous Care Items:  - Current Diet: FLD  - Foley: External foley catheter in place.   - DVT PPX: SCD; Heparin 5000 SQ TID   - PT/OT: Final recommendations given  Custodial SNF     DC pending clearance of post-operative milestones.    The patient was seen and discussed with attending surgeon Dr. Marisa Severin.    Electronically signed by:  Gailen Shelter. [place] [facility] PGY1  Bariatric/Foregut Surgery [phone]    Surgery Staff Note  This patient was seen, examined, evaluated, and care plan was developed with the resident. Details of our E&M as outlined in the resident's note are reviewed, verified and updated or edited.  I agree with the findings and plan we developed.      Electronically signed by  Clyda Greener, MD  Attending  Dep of General Surgery  [phone]

## 2020-09-27 NOTE — Progress Notes (Signed)
SPECIALTY SURGERY - PROGRESS NOTE  Foregut, Metabolic, and General Surgery     Date of Admission: 09/21/2020 (LOS: 6)  Note Date and Time: 09/27/2020 09:20    OPERATIONS AND PROCEDURES      Procedure(s):  REPAIR, HERNIA, INCISIONAL (N/A)  INSERTION, MESH, ABDOMINAL WALL, ANTERIOR (N/A)  REPAIR, HERNIA, INGUINAL, UNILATERAL (Right)  COMPONENT SEPARATION, ABDOMINAL WALL (Bilateral)  (09/21/2020 - Shellia Cleverly, MD )  6 Days Post-Op    MAJOR 24 HOUR EVENTS     - 6 Days Post-Op s/p open ventral hernia and right inguinal hernia repair with mesh  - Erythema to skin on abdomen much improved   - Tolerating FLD, took in 1150 PO wo nausea or vomitting  - one BM yesterday  - PCA dced yesterday, patient still in pain. Amenable to IV medications  - patient reports he takes his Valium q6 hours scheduled, not PRN    MEDICATIONS    All medications have been reviewed in the [facility] and changes reflected in the plan.     FAMILY AND SOCIAL HISTORY    Family and social history were reviewed in the patient's initial history and physical.   All changes are reflected in the plan.     VITAL SIGNS     Current Vitals  Temp: 36.8 C (98.2 F)  Pulse: 71  BP: 98/57  Resp: 14  SpO2: 96 % 24 Hour Vitals (Min/Max)  Temp Max: 36.8 C (98.2 F)  Pulse Min: 71 Max: 74  BP: (98-128)/(57-75)    Resp Min: 14 Max: 16  SpO2 Min: 96 % Max: 98 %     INTAKE AND OUTPUT:  I/O Last 2 Completed Shifts:  In: 1711.5 [Oral:1150; Crystalloid:560.1]  Out: 1856 [Urine:1150; Drains:115]  Weight: 75.6 kg (166 lb 10.7 oz) (09/21/20 3149)     PHYSICAL EXAM     General: No acute distress. Comfortable.  Cardiac: Stable HR and BP, symmetric chest rise  Pulmonary: Breathing on RA  Abdomen: soft, tender to palpation, mildly tense to bilateral lower quadrants. JP drains x3 with light serosanguineous output.  Abdominal wall erythema improved when compared to yesterday. Incision clean and intact, closed with staples, no erythema along incision, no purulent fluid draining from  incision  JP1: 50 cc   JP2: 45 cc  JP3: 20 cc  GU: External catheter in place with light yellow urine  Extremity: Severely contracted bilateral lower extremity, moving upper extremities    RELEVANT LABS STUDIES     Hemoglobin   Date Value Ref Range Status   09/27/2020 10.7 (L) 13.5 - 17.5 g/dL Final   09/26/2020 10.4 (L) 13.5 - 17.5 g/dL Final   09/25/2020 10.7 (L) 13.5 - 17.5 g/dL Final       CLINICAL COURSE, ASSESSMENT, AND PLAN     Roy Henry is a 9yr man with history of MS and severely contracted BLE who is 6 Days Post-Op s/p open ventral hernia repair and right inguinal hernia repair with mesh. He is a functional quadriplegic and requires total nursing care for ADLs and safety. He is recovering appropriately, but with slow return of bowel function. We will add oral pain meds in attempt to wean the PCA and encourage more physical activity this morning.     - Advance to FLD diet today   - Given his history of constipation and MS affecting his bowel function, will continue multimodal bowel care with miralax, colace, senna  - Aspiration precautions as patient is bed-bound at baseline  -  Continue PCA until noon, then will discuss discontinuing and using PRN oxycodone and dilaudid; continue gabapentin, and tylenol with scheduled NSAIDS   - Continue low dose PRN oxycodone and dilaudid but encourage NON-narcotics  - Encourage IS and pulmonary hygiene  - Physical therapy for in bed exercises and possible OOB to cardiac chair  - Continue vanco and Fluconazole (3/10 - TBD) for abdominal wall cellulitis, improving.  - Continue placing interdry Ag (in addition to the one tucked in groin) extending from groin to cover the lower abdomen and incision, then covered by ABD pad  - Bacitracin to drain sites TID    Management of intertrigo per wound care:  Apply Interdry Ag textile along groins, skin folds, Conform textile so part of the textile is on direct contact with damaged skin and part of it is exposed to air so it  wicks away moisture from skin. Change Interdry Ag evey 5 days. Do not toss old Interdry Ag,it can be washed with water only, and let air dry for re- use for up to 3 months.   Interdry Ag is designed to wick away moisture from skin and it is impregnated with silver for antimicrobial/antifungal effects.     Apply Mepilex border dressing to sacrum- change every 3 days and PRN for soilage.  Turn and reposition 30 degrees lateral every 2 hours or more frequent and according to patient's medical condition  HOB 30 degrees or less if no contraindications  Continue with waffle mattress  Off load bilateral heels,using WAFFLE boots  Manage moisture,use foley /condom cath /flexiseal or rectal pouch as indicated   Avoid diapers ,except during therapy   Use white breathable pads for better skin microclimate   Continue with efforts to optimize hydration and nutrition    Miscellaneous Care Items:  - Current Diet: FLD  - Foley: External foley catheter in place.   - DVT PPX: SCD; Heparin 5000 SQ TID   - PT/OT: Final recommendations given  Custodial SNF     DC pending clearance of post-operative milestones.    The patient was seen and discussed with attending surgeon Dr. Marisa Severin.    Electronically signed by:  Gailen Shelter. [place] [facility] PGY1  Bariatric/Foregut Surgery [phone]        Surgery Staff Note  This patient was seen, examined, evaluated, and care plan was developed with the resident. Details of our E&M as outlined in the resident's note are reviewed, verified and updated or edited.  I agree with the findings and plan we developed.      Electronically signed by  Clyda Greener, MD  Attending  Dep of General Surgery  [phone]

## 2020-09-28 LAB — PHOSPHORUS (PO4): Phosphorus (PO4): 3.1 mg/dL (ref 2.5–4.5)

## 2020-09-28 LAB — BASIC METABOLIC PANEL
Calcium: 8.5 mg/dL — ABNORMAL LOW (ref 8.6–10.0)
Carbon Dioxide Total: 25 mmol/L (ref 22–29)
Chloride: 108 mmol/L — ABNORMAL HIGH (ref 98–107)
Creatinine Serum: 0.5 mg/dL — ABNORMAL LOW (ref 0.51–1.17)
E-GFR Creatinine (Male): >100 mL/min/{1.73_m2}
Glucose: 87 mg/dL (ref 74–109)
Potassium: 4 mmol/L (ref 3.4–5.1)
Sodium: 141 mmol/L (ref 136–145)
Urea Nitrogen, Blood (BUN): 5 mg/dL — ABNORMAL LOW (ref 6–20)

## 2020-09-28 LAB — CBC WITH DIFFERENTIAL
Basophils % Auto: 0.5 %
Basophils Abs Auto: 0 10*3/uL (ref 0.0–0.2)
Eosinophils % Auto: 2.4 %
Eosinophils Abs Auto: 0.1 10*3/uL (ref 0.0–0.5)
Hematocrit: 31.5 % — ABNORMAL LOW (ref 41.0–53.0)
Hemoglobin: 10.5 g/dL — ABNORMAL LOW (ref 13.5–17.5)
Lymphocytes % Auto: 41.4 %
Lymphocytes Abs Auto: 2.1 10*3/uL (ref 1.0–4.8)
MCH: 29.9 pg (ref 27.0–33.0)
MCHC: 33.5 % (ref 32.0–36.0)
MCV: 89.5 fL (ref 80.0–100.0)
MPV: 8.1 fL (ref 6.8–10.0)
Monocytes % Auto: 10.8 %
Monocytes Abs Auto: 0.6 10*3/uL (ref 0.1–0.8)
Neutrophils % Auto: 44.9 %
Neutrophils Abs Auto: 2.3 10*3/uL (ref 1.8–7.7)
Platelet Count: 264 10*3/uL (ref 130–400)
RDW: 12.6 % (ref 0.0–14.7)
Red Blood Cell Count: 3.52 10*6/uL — ABNORMAL LOW (ref 4.50–5.90)
White Blood Cell Count: 5.1 10*3/uL (ref 4.5–11.0)

## 2020-09-28 LAB — MAGNESIUM (MG): Magnesium (Mg): 1.9 mg/dL (ref 1.6–2.4)

## 2020-09-28 MED ORDER — SULFAMETHOXAZOLE 800 MG-TRIMETHOPRIM 160 MG TABLET
1.0000 | ORAL_TABLET | Freq: Two times a day (BID) | ORAL | Status: DC
Start: 2020-09-28 — End: 2020-09-29
  Administered 2020-09-28 – 2020-09-29 (×2): 1 via ORAL
  Filled 2020-09-28 (×2): qty 1

## 2020-09-28 MED ORDER — FLUCONAZOLE 200 MG TABLET
200.0000 mg | ORAL_TABLET | ORAL | Status: DC
Start: 2020-09-28 — End: 2020-09-29
  Administered 2020-09-28 – 2020-09-29 (×2): 200 mg via ORAL
  Filled 2020-09-28 (×2): qty 1

## 2020-09-28 MED ORDER — MAGNESIUM SULFATE 1 GRAM/100 ML IN DEXTROSE 5 % INTRAVENOUS PIGGYBACK
1.0000 g | INJECTION | Freq: Once | INTRAVENOUS | Status: AC
Start: 2020-09-28 — End: 2020-09-28
  Administered 2020-09-28: 1 g via INTRAVENOUS
  Filled 2020-09-28: qty 100

## 2020-09-28 NOTE — Nurse Assessment (Signed)
ASSESSMENT NOTE    Note Started: 09/28/2020, 19:42     Initial assessment completed and recorded in EMR.  Report received from day shift nurse and orders reviewed. Plan of Care reviewed and appropriate, discussed with patient.  Namon Cirri,  RN

## 2020-09-28 NOTE — Care Plan (Signed)
Problem: Adult Inpatient Plan of Care  Goal: Plan of Care Review  Outcome: Ongoing, Progressing  Flowsheets (Taken 09/28/2020 0412)  Plan of Care Reviewed With: patient  Progress: improving  Outcome Summary: AOx4, VSS, pain controlled with oxycodone, tylenol and IV dilaudid, gabapentin, baclofen. Valium for spasms, tolerating full liquid diet, BM x1 large, JP x3,  RLQ JPx1, LLQ JPx2, good UOP via condom catheter, lift team turns, BLE contracted, on waffle mattress.

## 2020-09-28 NOTE — Care Plan (Signed)
Problem: Adult Inpatient Plan of Care  Goal: Plan of Care Review  Outcome: Ongoing, Progressing  Flowsheets (Taken 09/28/2020 1456)  Plan of Care Reviewed With: patient  Progress: improving  Outcome Summary:   AOx4. Abd incision CDI   JPx2 intact   JP#3 Dc'd by MD. Pain well controlled. Good PO intake. Condom cath intact   good UOP. Loose BM   stool softeners held. Plan for SNF placement.

## 2020-09-28 NOTE — Discharge Summary (Signed)
SPECIALTY SURGERY - DISCHARGE SUMMARY  Foregut, Metabolic, & General Surgery     DATE OF ADMISSION: 09/21/2020 DATE OF DISCHARGE: 09/29/2020     ADMISSION DIAGNOSIS: Incisional Hernia  DISCHARGE DIAGNOSIS: Same as above.  ATTENDING SURGEON: Shellia Cleverly, MD    OPERATIONS/PROCEDURES (THIS HOSPITALIZATION)     09/21/2020 (Dr. Lyo/Dr. 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 In November 2021, he felt a pop in his abdomen after rolling over. Subsequent CT A/P showed an incisional hernia and he was then referred to surgery clinic. At his clinic visit in January 2022, patient was also noted to have a right femoral artery thrombosis and a symptomatic right inguinal hernia. Given the symptomatic nature of his incisional and inguinal hernias, surgical intervention was recommended. He presented on 09/21/2020 for his open ventral hernia repair with mesh and possible abdominal wall reconstruction using transversus abdominis muscle release (TAR) and possible open right inguinal hernia repair with mesh. He was then admitted to the bariatric/general surgery service for post-operative management.       HOSPITAL COURSE / [place] was admitted on 09/21/2020 s/p the above procedure which he tolerated well. Below is a summary of the active problems that were addressed during this hospitalization. By the day of discharge, the patient was medically stable for discharge to SNF.    # Incisional Hernia  - s/p open repair  with mesh and posterior component separation on 09/21/2020  - 3 JP drains placed intra-operatively   - Prevena midline dressing removed on 3/11 (POD4) without incident   - JP drain #3 (over the onlay mesh) removed on 3/14 (POD7) without incident  - Foley was removed and patient was able to void via external catheter   - DVT prophylaxis was given with SQH daily   - pain was controlled with initially IV pain medications, transitioned to oral by time of discharge      # Post-op Ileus  - increased abdominal distention noted on POD3 (no return of bowel function yet)  - patient made NPO, KUB showed ileus   - suppository ordered, senna, miralax, colace also prescribed   - opioid pain medications decreased/IV narcotics weaned as tolerated  - had 5 large bowel movements after increase in bowel care  - diet advanced slowly as tolerated from sips of clears to regular diet   - patient continued to have bowel function until day of discharge       # Abdominal Wall Cellulitis  - increased erythema noted to right lower quadrant of abdomen on POD4  - prevena dressing removed, midline incision probed, no purulent drainage noted, fascial closure was intact   - bacitracin added to drain sites, applied TID  - started on vancomycin and fluconazole (3/10-3/14)  - Interdry applied to lower portion of midline incision   - erythema much improved on POD7 (3/14)  - transitioned to PO bactrim and PO fluconazole (3/14-3/17)      # Intertrigo  - wound  care consulted   - Interdry Ag applied to bilateral groins       # Multiple Sclerosis  - continued home baclofen as previously prescribed  - continued home valium as previously prescribed  - practiced strict aspiration precautions  - Mepilex border applied to sacrum   - patient turned 30 degrees laterally every 2 hours   - seen by physical therapy, recommended custodial SNF for total care   - f/u with neurology to resume rituximab infusions and/or botox injections       INFECTIONS/COMPLICATIONS      The patient did not have any complications during this hospitalization.    CONSULTATIONS     WOUND CARE NURSE CONSULT    PHYSICAL EXAM AT DISCHARGE     General: No acute distress. Comfortable.  Cardiac: Stable HR and BP, symmetric chest rise  Pulmonary: Breathing on RA  Abdomen: soft, tender to palpation, mildly tense to bilateral lower quadrants. JP drains x2 with light serosanguineous output. Barely any erythema left to right side of abdomen. Midline incision clean and intact, closed with staples, no erythema along incision, no purulent fluid draining from incision. All drains have been removed  GU: External catheter in place with light yellow urine  Extremity: Severely contracted bilateral lower extremity, moving upper extremities    RECENT LABS AND STUDIES     Recent labs for the past 48 hours     09/28/20  0649 09/29/20  0654   NA 141 139   K 4.0 4.0   CL 108* 104   CO2 25 26   BUN 5* 6   CR 0.50* 0.56   GLU 87 92      Recent labs for the past 48 hours     09/28/20  0649 09/29/20  0654   WBC 5.1 5.0   HGB 10.5* 10.7*   HCT 31.5* 31.4*   PLT 264 274      No results for input(s): INR in the last 48 hours.    Invalid input(s): PTT   No results for input(s): TP, ALB, TBIL, ALP, AST, ALT, BILID in the last 48 hours.    RECENT IMAGING STUDIES:  KUB 09/25/2020:  1. Nonobstructive bowel gas pattern.    DISCHARGE INFORMATION     DISCHARGE DATE: 09/29/2020  DISCHARGE ATTENDING: Shellia Cleverly, MD  DISCHARGE CONDITION: Good  DISCHARGE DISPOSITION: SNF    MEDICATIONS PRESCRIBED FOR DISCHARGE        Medication List        START taking these medications      Acetaminophen 500 mg Tablet  Commonly known as: TYLENOL  Take 2 tablets by mouth every 6 hours.     Bacitracin 500 unit/g Topical Ointment  Apply 1 Application to the affected area 3 times daily.     Fluconazole 200 mg Tablet  Commonly known as: DIFLUCAN  Take 1 tablet by mouth every day.     Gabapentin 100 mg Capsule  Commonly known as: NEURONTIN  Take 1 capsule by  mouth 3 times daily.     Ibuprofen 600 mg Tablet  Commonly known as: MOTRIN  Take 1 tablet by mouth 3 times daily with meals. take with food     Lidocaine 5 %(700 mg/patch) Patch  Commonly known as: LIDODERM  Apply 1 patch to the skin every 24 hours. Apply to painful area (12 hours on, 12 hours off)     Oxycodone 5 mg Tablet  Commonly known as: ROXICODONE  Take 1 tablet by mouth  every 4 hours if needed.     Sennosides 8.6 mg Tablet  Commonly known as: SENOKOT  Take 2 tablets by mouth every day at bedtime.  Replaces: Senna 8.6 mg Capsule     Sulfamethoxazole-Trimethoprim 800-160 mg Tablet  Commonly known as: BACTRIM DS  Take 1 tablet by mouth 2 times daily.            CHANGE how you take these medications      * Baclofen 10 mg Tablet  Commonly known as: LIORESAL  Take 2 tablets by mouth 4 times daily.  What changed: Another medication with the same name was added. Make sure you understand how and when to take each.     * Baclofen 20 mg Tablet  Commonly known as: LIORESAL  Take 1 tablet by mouth 4 times daily.  What changed: You were already taking a medication with the same name, and this prescription was added. Make sure you understand how and when to take each.     * Diazepam 5 mg Tablet  Commonly known as: VALIUM  Take 1 tablet by mouth every 6 hours if needed (spasticity).  What changed: Another medication with the same name was added. Make sure you understand how and when to take each.     * Diazepam 5 mg Tablet  Commonly known as: VALIUM  Take 1 tablet by mouth every 6 hours.  What changed: You were already taking a medication with the same name, and this prescription was added. Make sure you understand how and when to take each.     * Pantoprazole 40 mg Delayed Release Tablet  Commonly known as: PROTONIX  Take 40 mg by mouth every morning before a meal.  What changed: Another medication with the same name was added. Make sure you understand how and when to take each.     * Pantoprazole 40 mg Delayed Release  Tablet  Commonly known as: PROTONIX  Take 1 tablet by mouth every morning before a meal.  What changed: You were already taking a medication with the same name, and this prescription was added. Make sure you understand how and when to take each.     * Polyethylene Glycol 3350 17 gram/dose Powder  Commonly known as: MIRALAX  Take 17 g by mouth every morning. Mix in 4 to 8 oz of water, soda, coffee, juice or tea.  What changed: Another medication with the same name was added. Make sure you understand how and when to take each.     * Polyethylene Glycol 3350 17 gram Powder  Commonly known as: MIRALAX  Take 1 packet by mouth every morning. Mix in 4 to 8 oz of water, soda, coffee, juice or tea.  What changed: You were already taking a medication with the same name, and this prescription was added. Make sure you understand how and when to take each.           * This list has 8 medication(s) that are the same as other medications prescribed for you. Read the directions carefully, and ask your doctor or other care provider to review them with you.                CONTINUE taking these medications      Amlodipine 10 mg Tablet  Commonly known as: NORVASC  Take 10 mg by mouth every day.     Aspirin 81 mg EC Tablet  Take 1 tablet by mouth every morning.  BOTOX INJ  0 Refill(s), Maintenance     * Docusate 100 mg Capsule  Commonly known as: COLACE  Take 100 mg by mouth 2 times daily.     * Docusate 100 mg Capsule  Commonly known as: COLACE  Take 1 capsule by mouth 2 times daily.     Magnesium Hydroxide 400 mg/5 mL Liquid  Commonly known as: MILK OF MAGNESIA  Take 30 mL by mouth once daily if needed.     Naloxone 4 mg/actuation Nasal Spray  Commonly known as: NARCAN  Use for opioid overdose. Give 1 spray in nostril. If no response / breathing slows down, repeat every 2 minutes until emergency help arrives.     VITAMIN D PO  Take 1,000 Units by mouth every morning.           * This list has 2 medication(s) that are the same as  other medications prescribed for you. Read the directions carefully, and ask your doctor or other care provider to review them with you.                STOP taking these medications      Hydrocodone-Acetaminophen 5-325 mg Tablet  Commonly known as: NORCO     Senna 8.6 mg Capsule  Generic drug: Sennosides  Replaced by: Sennosides 8.6 mg Tablet            SNF does NOT need to provide Botox    INSTRUCTIONS     Please see encounter discharge instructions for a complete list of D/C instructions.    Activity Restrictions: No heavy lifting >10 lbs for 6 weeks. As tolerated.  Diet Restrictions: Regular diet.  Wound Care: Continue wound care as instructed. Staples to be removed in clinic. JP drain care per SNF.    FOLLOW UP     Follow up with 1 week after discharge. An appointment has been scheduled for 10/08/20    Future Appointments   Date Time Provider [place]   11/05/2020  3:00 PM Serita Sheller, MD [facility]     Electronically signed by:  Lum Keas, MD  General Surgery Intern  General/Bariatric Surgery [phone]    Gailen Shelter. [place] Surgery PGY1  Bariatric/Foregut Surgery [phone]    Surgery Staff Note  This patient was seen, examined, evaluated, and care plan was developed with the resident. Details of our E&M as outlined in the resident's note are reviewed, verified and updated or edited.  I agree with the findings and plan we developed.      Electronically signed by  Clyda Greener, MD  Attending  Dep of General Surgery  [phone]

## 2020-09-28 NOTE — Progress Notes (Signed)
SPECIALTY SURGERY - PROGRESS NOTE  Foregut, Metabolic, and General Surgery     Date of Admission: 09/21/2020 (LOS: 8)  Note Date and Time: 09/29/2020 06:59    OPERATIONS AND PROCEDURES      Procedure(s):  REPAIR, HERNIA, INCISIONAL (N/A)  INSERTION, MESH, ABDOMINAL WALL, ANTERIOR (N/A)  REPAIR, HERNIA, INGUINAL, UNILATERAL (Right)  COMPONENT SEPARATION, ABDOMINAL WALL (Bilateral)  (09/21/2020 - Shellia Cleverly, MD )  8 Days Post-Op    MAJOR 24 HOUR EVENTS     - 8 Days Post-Op s/p open ventral hernia and right inguinal hernia repair with mesh  - Drain #3 removed without incident yesterday   - Advanced to a regular diet, tolerated well, denied nausea/vomiting, took in 2110 cc  - Required dilaudid x2 yesterday during the day, did not require overnight   - Had 1 BM  - covid test ordered for SNF    MEDICATIONS    All medications have been reviewed in the [facility] and changes reflected in the plan.     FAMILY AND SOCIAL HISTORY    Family and social history were reviewed in the patient's initial history and physical.   All changes are reflected in the plan.     VITAL SIGNS     Current Vitals  Temp: 36.9 C (98.5 F)  Pulse: 71  BP: 125/72  Resp: 18  SpO2: 95 % 24 Hour Vitals (Min/Max)  Temp Max: 37 C (98.6 F)  Pulse Min: 71 Max: 80  BP: (102-125)/(70-75)    Resp Min: 14 Max: 18  SpO2 Min: 95 % Max: 96 %     INTAKE AND OUTPUT:  I/O Last 2 Completed Shifts:  In: 2523.9 [Oral:2230; Crystalloid:293.9]  Out: 1736 [Urine:1625; Drains:111]  Weight: 75.6 kg (166 lb 10.7 oz) (09/21/20 6283)     PHYSICAL EXAM     General: No acute distress. Comfortable.  Cardiac: Stable HR and BP, symmetric chest rise  Pulmonary: Breathing on RA  Abdomen: soft, tender to palpation, mildly tense to bilateral lower quadrants. JP drains x2 with light serosanguineous output. Barely any erythema left to right side of abdomen. Midline incision clean and intact, closed with staples, no erythema along incision, no purulent fluid draining from incision  JP1: 58 cc    JP2: 58 cc  GU: External catheter in place with light yellow urine  Extremity: Severely contracted bilateral lower extremity, moving upper extremities    RELEVANT LABS STUDIES     Hemoglobin   Date Value Ref Range Status   09/28/2020 10.5 (L) 13.5 - 17.5 g/dL Final   09/27/2020 10.7 (L) 13.5 - 17.5 g/dL Final   09/26/2020 10.4 (L) 13.5 - 17.5 g/dL Final       CLINICAL COURSE, ASSESSMENT, AND PLAN     Roy Henry is a 55yr man with history of MS and severely contracted BLE who is 8 Days Post-Op s/p open ventral hernia repair and right inguinal hernia repair with mesh. He is a functional quadriplegic and requires total nursing care for ADLs and safety. He is recovering appropriately, is tolerating a regular diet, and has had return of bowel function. Stable for discharge to SNF.    - Continue regular diet  - Given his history of constipation and MS affecting his bowel function, will continue multimodal bowel care with miralax, colace, senna  - Aspiration precautions as patient is bed-bound at baseline  - PCA discontinued and using PRN oxycodone continue gabapentin, and tylenol with scheduled NSAIDS   - Continue low  dose PRN oxycodone but encourage NON-narcotics  - Encourage IS and pulmonary hygiene  - Physical therapy for in bed exercises and possible OOB to cardiac chair  - Continue PO Bactrim and Fluconazole until 3/17 (s/p IV vanc and fluconazole 3/10-3/14) for abdominal wall cellulitis  - Continue placing interdry Ag (in addition to the one tucked in groin) extending from groin to cover the lower abdomen and incision, then covered by ABD pad  - Bacitracin to drain sites TID    Management of intertrigo per wound care:  Apply Interdry Ag textile along groins, skin folds, Conform textile so part of the textile is on direct contact with damaged skin and part of it is exposed to air so it wicks away moisture from skin. Change Interdry Ag evey 5 days. Do not toss old Interdry Ag,it can be washed with water only,  and let air dry for re- use for up to 3 months.   Interdry Ag is designed to wick away moisture from skin and it is impregnated with silver for antimicrobial/antifungal effects.     Apply Mepilex border dressing to sacrum- change every 3 days and PRN for soilage.  Turn and reposition 30 degrees lateral every 2 hours or more frequent and according to patient's medical condition  HOB 30 degrees or less if no contraindications  Continue with waffle mattress  Off load bilateral heels,using WAFFLE boots  Manage moisture,use foley /condom cath /flexiseal or rectal pouch as indicated   Avoid diapers ,except during therapy   Use white breathable pads for better skin microclimate   Continue with efforts to optimize hydration and nutrition    Miscellaneous Care Items:  - Current Diet: Regular diet   - Foley: External foley catheter in place.   - DVT PPX: SCD; Heparin 5000 SQ TID   - PT/OT: Final recommendations given  Custodial SNF     D/C today to Post Acute SNF    The patient was seen and discussed with bariatric fellow, Dr. Retta Mac.    Electronically signed by:  Gailen Shelter. [place] Surgery PGY1  Bariatric/Foregut Surgery [phone]    Surgery Staff Note  This patient was seen, examined, evaluated, and care plan was developed with the resident. Details of our E&M as outlined in the resident's note are reviewed, verified and updated or edited.  I agree with the findings and plan we developed.      Electronically signed by  Clyda Greener, MD  Attending  Dep of General Surgery  [phone]

## 2020-09-28 NOTE — Allied Health Progress (Signed)
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 Admitted for and now s/p open ventral hernia and R inguinal hernia repair with mesh (3/7). Course c/b initial slow ROBF and DX Abdomen 3/10 concerning for post-op ileus.    Food and Nutrition Related History:    PTA, patient reports good appetite and intake, eating 3 meals/day. Reports weight gain of 50 lbs from July 2021 to now. Denies any baseline GI Sx. Patient bed-bound at baseline.    Diet alternated between NPO and Clear Liquids 3/8-3/11; advanced to Full Liquids 3/12; advanced to Regular 3/14.     Currently, patient reports very good appetite, hungry, looking forward to Regular diet meals. Reports abdominal discomfort improving, denies nausea or other GI Sx presently.    Spoke with patient in room  PPE used by provider during encounter: N95 mask    Food Allergies: NKFA    Nutrition Focused Physical Exam: Not indicated    Additional nutrition focused physical findings:  Potential signs of inflammation: N/A  Digestive Systems:   GI S/Sx: abdomen distended; DX Abdomen 3/11 showing non-obstructive bowel gas pattern   LBM: 3/14 x1 Bristol type 3  Skin:    Incision: abdomen  Drains:    1 LUQ; 1 LLQ; 1 RLQ    Output: averaging 226 mL/d 3/8-3/13    Anthropometrics:   Height: 185.4 cm (6' 0.99") (carried over) (Reported)  Admit Weight: 75.6 kg (166 lb 10.7 oz) (09/21/20) (sling)   Most Recent Weight: 75.6 kg (166 lb 10.7 oz) (09/21/20) (Sling)   Usual Body Weight:    Weight Hx:   65.8 kg (07/21/20) (reported) - office visit   64.3 kg (02/26/20) (bed scale) - previous admit  Weight Change: minimal weight records in  EMR/Care Everywhere; weight appears to be trending up PTA; patient reports 50 lb weight gain x 8 months PTA  ()   BMI: 22.04 kg/m2  Ideal Body Weight (adjusted 10-15% for quadriplegia): 72.11-76.36 kg  % Ideal Body Weight: 100% (within IBW range)    Pertinent Labs:   reviewed      Pertinent Medications:   reviewed, pertinent      Scheduled:  Pantoprazole (PROTONIX) Delayed Release Tablet 40 mg, ORAL, QAM AC  Polyethylene Glycol 3350 (MIRALAX) Oral Powder Packet 17 g, ORAL, QAM  Sennosides (SENOKOT) Tablet 17.2 mg, ORAL, Daily Bedtime    Nutrition Order:   Diet Order: Regular    Nutrition Order Adequacy: meets nutritional requirements  Nutrition Order Tolerance: tolerating    Estimated Nutrition Needs:    Calorie Requirements:  Calorie Dosing Wt: 75.6 kg (166 lb 10.7 oz)  Calorie estimation method: kcal/kg  Calculation Factor: 22.7 (quadriplegia)  Calorie Needs: 1720- Protein Requirements:  Protein Dosing Wt: 75.6 kg (166 lb 10.7 oz)  g Protein/Kg/d: 1-1.2  Protein Needs: 75.6-90.72 Fluid Requirements:  Fluid Dosing Wt: 75.6 kg (166 lb 10.7 oz)  mL Fluid/Kg/d: 30-  Fluid Needs: 2268.01-       Estimated Nutrition Intake: 3/8-3/13  NPO/Clear Liquids 3/8-3/11; averaged 50% x 1 day Clear Liquids; averaged 80% x 2 days Full Liquids; 4 Boost Breeze total.  Calories Evaluation:  Enteral Calories (kcal): 0  Parenteral Calories (kcal): 0  Oral Calories (kcal): 734 (includes ONS)  Other Calories (kcal): 197 (IV dextrose)  Total Calories (kcal): 931  % of Kcal Needs: 54.13 % Protein Evaluation:  Enteral Protein (gm): 0  Parenteral Protein (gm): 0  Oral Protein (gm): 32 (includes ONS)  Other Protein (gm): 0  Total Protein (gm): 32  % of Protein Needs: 42.33 %     Nutrition Diagnosis     No diagnosis of malnutrition at this time.     Inadequate protein energy intake related to prolonged NPO/Clear Liquids status as evidenced by patient has met 55% of calorie needs and 45% of protein needs over past 6 days.  - Status:  new    Nutrition Intervention (Recommendations)     1. Meals and Snacks  - Continue Regular diet    2. Collaboration with other providers   - Continue bowel care    Nutrition Monitoring & Evaluation (Goals)     1. Total energy intake: at least 1550 kcal/day  2. Total protein intake: at least 55 g of protein/day  3. Digestive system: soft formed BMs Q 1-2 days    Report Electronically Signed By:  Earley Favor, Dietetic Intern, Pager: [phone], TigerText: Darral Dash  (Covering for: Burgess Estelle, [place] [employer], [place]     Pager [phone])

## 2020-09-28 NOTE — Nurse Assessment (Signed)
ASSESSMENT NOTE    Note Started: 09/28/2020, 07:00     Initial assessment completed and recorded in EMR.  Report received from night shift nurse and orders reviewed. Plan of Care reviewed and updated, discussed with patient.  Zollie Pee, RN

## 2020-09-28 NOTE — SNF Discharge (Signed)
SKILLED NURSING FACILITY ADMIT ORDERS  Date: 09/29/2020   Time: 13:39       PATIENT: Roy Henry  MRN:  [id number]  DOB:  [birth year]    1. Admitted to Inpatient Date: 09/21/20      Discharged to SNF on: 09/29/2020     2. Primary Diagnosis: Incisional Hernia  3. Secondary Diagnoses: Incisional Hernia. Multiple Sclerosis. Abdominal Wall Cellulitis    4. Rehabilitation Potential: fair    5. CODE STATUS: Full  Supportive documentation provided in (progress notes/DC summary/H&P)    6. Hospice: no    7. The patient has the capacity to understand and make informed decisions:   Yes    8. Allergies:   Tegretol [Carbamazepine]    Unknown-Explain in Comments    Comment:unknown      9a.  Isolation type : No active isolations  9b.  Infections of Concern: No Infections that Concern Infection Control    10. Diet:   PO -  regular      11. Activity restriction: as tolerated. Patient is bed bound at baseline and cannot move his lower extremities due to severe contractures from his multiple sclerosis. Patient can only move his left upper arm.      12. Vital signs:   Routine per SNF    13. Weight: Weight: 75.6 kg (166 lb 10.7 oz) (09/21/20 0347)   Routine on admission, then monthly.    14. TB Screen:   PPD done - no Date: N/A  CXR done - no Date: N/A    15. Wound care:  See below     Management of intertrigo:  ApplyInterdry Ag textile (gray cloth) alonggroins, skin folds.  Please conform the textile so that part of the textile is on direct contact with damaged skin and part of it is exposed to air so it wicks away moisture from skin.   Change Interdry Ag evey 5 days. Do not toss old Interdry Ag, it can be washed with water only, and let air dry for re- use for up to 3 months.  Interdry Ag is designed to wick away moisture from skin and it is impregnated with silver for antimicrobial/antifungal effects.    Management of incision:  Continue placing interdry Ag from the groin to cover the lower abdomen and incision and then cover this  with an abdominal pad.  Please do not remove the staples. These will be removed in clinic. Patient can shower with staples in place. Please do not scrub at the incision.    Overall wound care:  Apply Mepilex border dressing to sacrum - change every 3 days and as needed for soilage  Turn and reposition 30 degrees lateral every 2 hours or more frequently as needed  Please maintain head of bed at 30 degrees or less if no contraindications  Continue with waffle mattress while in bed  Please off load weight from bilateral heels using WAFFLE boots  Manage moisture, use condom catheter /flexiseal or rectal pouch as indicated   Avoid diapers, except during therapy   Use white breathable pads for better skin microclimate   Continue with efforts to optimize hydration and nutrition      16. Bowel care:   Senna daily at bedtime. Miralax daily.   If no bowel movement after two days, start colace twice a day and give milk of magnesia.     17. Foley in place?: no, condom catheter in place   If present, routine foley care PRN.  18. Scheduled follow up appointments:   10/08/20 with Gaylan Gerold for Staple removal    19. Recommended follow up appointments:  No follow-up provider specified.  Other: Follow up with neurologist regarding resuming Rituximab infusions and surgery clinic for staple/drain removal.    20. Dialysis:   No    21. Therapy Orders to be initiated:   Physical Therapy, Occupational Therapy and Incentive Spirometry - (for 4 weeks)    22. Additional Interventions:  Antibiotics:  Oral - Type: Bactrim and Fluconazole  Frequency:Bactrim every 12 hours. Fluconazole daily.  End Date:10/01/2020    23. Medications and treatments:     Medication List      START taking these medications    Acetaminophen 500 mg Tablet  Commonly known as: TYLENOL  Take 2 tablets by mouth every 6 hours.     Bacitracin 500 unit/g Topical Ointment  Apply 1 Application to the affected area 3 times daily.     Fluconazole 200 mg Tablet  Commonly  known as: DIFLUCAN  Take 1 tablet by mouth every day.     Gabapentin 100 mg Capsule  Commonly known as: NEURONTIN  Take 1 capsule by mouth 3 times daily.     Ibuprofen 600 mg Tablet  Commonly known as: MOTRIN  Take 1 tablet by mouth 3 times daily with meals. take with food     Lidocaine 5 %(700 mg/patch) Patch  Commonly known as: LIDODERM  Apply 1 patch to the skin every 24 hours. Apply to painful area (12 hours on, 12 hours off)     Oxycodone 5 mg Tablet  Commonly known as: ROXICODONE  Take 1 tablet by mouth every 4 hours if needed.     Sennosides 8.6 mg Tablet  Commonly known as: SENOKOT  Take 2 tablets by mouth every day at bedtime.  Replaces: Senna 8.6 mg Capsule     Sulfamethoxazole-Trimethoprim 800-160 mg Tablet  Commonly known as: BACTRIM DS  Take 1 tablet by mouth 2 times daily.        CHANGE how you take these medications    * Baclofen 10 mg Tablet  Commonly known as: LIORESAL  Take 2 tablets by mouth 4 times daily.  What changed: Another medication with the same name was added. Make sure you understand how and when to take each.     * Baclofen 20 mg Tablet  Commonly known as: LIORESAL  Take 1 tablet by mouth 4 times daily.  What changed: You were already taking a medication with the same name, and this prescription was added. Make sure you understand how and when to take each.     * Diazepam 5 mg Tablet  Commonly known as: VALIUM  Take 1 tablet by mouth every 6 hours if needed (spasticity).  What changed: Another medication with the same name was added. Make sure you understand how and when to take each.     * Diazepam 5 mg Tablet  Commonly known as: VALIUM  Take 1 tablet by mouth every 6 hours.  What changed: You were already taking a medication with the same name, and this prescription was added. Make sure you understand how and when to take each.     * Pantoprazole 40 mg Delayed Release Tablet  Commonly known as: PROTONIX  Take 40 mg by mouth every morning before a meal.  What changed: Another  medication with the same name was added. Make sure you understand how and when to take each.     *  Pantoprazole 40 mg Delayed Release Tablet  Commonly known as: PROTONIX  Take 1 tablet by mouth every morning before a meal.  What changed: You were already taking a medication with the same name, and this prescription was added. Make sure you understand how and when to take each.     * Polyethylene Glycol 3350 17 gram/dose Powder  Commonly known as: MIRALAX  Take 17 g by mouth every morning. Mix in 4 to 8 oz of water, soda, coffee, juice or tea.  What changed: Another medication with the same name was added. Make sure you understand how and when to take each.     * Polyethylene Glycol 3350 17 gram Powder  Commonly known as: MIRALAX  Take 1 packet by mouth every morning. Mix in 4 to 8 oz of water, soda, coffee, juice or tea.  What changed: You were already taking a medication with the same name, and this prescription was added. Make sure you understand how and when to take each.         * This list has 8 medication(s) that are the same as other medications prescribed for you. Read the directions carefully, and ask your doctor or other care provider to review them with you.            CONTINUE taking these medications    Amlodipine 10 mg Tablet  Commonly known as: NORVASC  Take 10 mg by mouth every day.     Aspirin 81 mg EC Tablet  Take 1 tablet by mouth every morning.     BOTOX INJ  0 Refill(s), Maintenance     * Docusate 100 mg Capsule  Commonly known as: COLACE  Take 100 mg by mouth 2 times daily.     * Docusate 100 mg Capsule  Commonly known as: COLACE  Take 1 capsule by mouth 2 times daily.     Magnesium Hydroxide 400 mg/5 mL Liquid  Commonly known as: MILK OF MAGNESIA  Take 30 mL by mouth once daily if needed.     Naloxone 4 mg/actuation Nasal Spray  Commonly known as: NARCAN  Use for opioid overdose. Give 1 spray in nostril. If no response / breathing slows down, repeat every 2 minutes until emergency help  arrives.     VITAMIN D PO  Take 1,000 Units by mouth every morning.         * This list has 2 medication(s) that are the same as other medications prescribed for you. Read the directions carefully, and ask your doctor or other care provider to review them with you.            STOP taking these medications    Hydrocodone-Acetaminophen 5-325 mg Tablet  Commonly known as: NORCO     Senna 8.6 mg Capsule  Generic drug: Sennosides  Replaced by: Sennosides 8.6 mg Tablet          Please give scheduled baclofen and valium as needed for muscle spasms. Please give tylenol, ibuprofen, and gabapentin first for pain control. You can apply 1 lidocaine patch for 12 hours as needed for pain relief on the abdomen. Please remove the lidocaine patch for the next 12 hours. Please only give the oxycodone as needed for pain NOT relieved by the other medications. Please try and wean the oxycodone over the next few days. Continue to give the Bactrim and Fluconazole until 10/01/2020. Please give daily stool softeners (senna and miralax) while taking the narcotic pain medications. If no bowel  movement after 2 days, please give colace twice a day and milk of magnesia.    SNF does NOT need to provide Botox    Note: For each psychotropic medication listed, the patient has capacity or informed consent has been obtained for use for the following diagnosis.    24. Does this patient have a history of CHF?  No    Surgery Attending Addendum    Electronically signed by:  Shellia Cleverly, MD, MTM  Assistant Professor  Division of Foregut, Metabolic, and General Surgery   Pager [phone]

## 2020-09-28 NOTE — Clinical Case Management (Signed)
Clinical Case Management Assessments    Name: Roy Henry  MRN: [id number]   Date of Birth: 11-26-1970 (75yr) Gender: male    Note Date: 09/28/2020 Note Time: 15:22       DISCHARGE PLANNING NOTE    Patient was transferred from Networked reference to record EAF .  Takeback Letter: Not Applicable    Permanent Address: [address]  [place] [address]  Discharge Address: SNF - [facility]: [address] [place], [place] [address]    Patient can follow-up with:   PCP: Patient, No Pcp Per  / Phone Number: None  Preferred Pharmacy: [facility], [place], [phone] [facility]    Funding/Billing: Payor: MEDICARE / Plan: MEDICARE PART A&B / Product Type: *No Product type* /    Secondary Insurance: PARTNERSHIP HP [place]    Reason for admission: 09/21/2020 PROCEDURE: open ventral hernia and right inguinal hernia repair with mesh    Patient able to participate in plan?: Yes  Living arrangements: Other (comment)  Type of Residence: skilled nursing facility  Home Environment (floors/stairs): [facility] [phone]  Who will be the primary caregiver after discharge?  Phone #? : Burke Keels [phone]  Pre-Hospitalization self-care deficits: Bathing, Toileting, Eating  Pre-Hospitalization mobility: Dependent severe assistance required  Type of home health care services in place: None  Anticipated home health care services: None  Anticipated Home Infusion: None  DME in place: None  Anticipated DME: None  Patient's goal upon discharge (in patient's own words) : back to [facility]  Anticipated Disposition: Home  Has provider discussed post-acute care options with the patient?: Yes  Does the patient have ongoing DC Planning needs?: Yes  Is the patient ready for discharge today?: No  Anticipated Barriers to Discharge: None    SNF Facility Name: [facility]  Ph: [phone]    [address], [place], [place] [address]  The list of  accepting post-acute providers/ the pt choice letter and the quality rating information has been provided to the pt/ or designated decision maker.    Comments:   Spoke with B[facility]/GI team - anticipate DC back to [facility] tomorrow.  Uploaded wound notes/orders for SNF to review. Spoke with Cloyde Reams - their wound nurse/DON reviewed and confirmed they can manage pt at SNF.    Anticipate for DC tomorrow - Molly aware and agreeable.  Will need updated COVID testing in the AM, MD aware and will order.    CM to follow    Follow-up/recommendations:  DC back to SNF      Date/Time: 09/28/2020 15:22  Electronically Signed by:   Fredirick Lathe, Case Manager  Pager: [phone]

## 2020-09-29 LAB — BASIC METABOLIC PANEL
Calcium: 8.6 mg/dL (ref 8.6–10.0)
Carbon Dioxide Total: 26 mmol/L (ref 22–29)
Chloride: 104 mmol/L (ref 98–107)
Creatinine Serum: 0.56 mg/dL (ref 0.51–1.17)
E-GFR Creatinine (Male): >100 mL/min/{1.73_m2}
Glucose: 92 mg/dL (ref 74–109)
Potassium: 4 mmol/L (ref 3.4–5.1)
Sodium: 139 mmol/L (ref 136–145)
Urea Nitrogen, Blood (BUN): 6 mg/dL (ref 6–20)

## 2020-09-29 LAB — CBC WITH DIFFERENTIAL
Basophils % Auto: 0.8 %
Basophils Abs Auto: 0 10*3/uL (ref 0.0–0.2)
Eosinophils % Auto: 2.2 %
Eosinophils Abs Auto: 0.1 10*3/uL (ref 0.0–0.5)
Hematocrit: 31.4 % — ABNORMAL LOW (ref 41.0–53.0)
Hemoglobin: 10.7 g/dL — ABNORMAL LOW (ref 13.5–17.5)
Lymphocytes % Auto: 37.3 %
Lymphocytes Abs Auto: 1.9 10*3/uL (ref 1.0–4.8)
MCH: 30.3 pg (ref 27.0–33.0)
MCHC: 34 % (ref 32.0–36.0)
MCV: 89.3 fL (ref 80.0–100.0)
MPV: 8.1 fL (ref 6.8–10.0)
Monocytes % Auto: 8.1 %
Monocytes Abs Auto: 0.4 10*3/uL (ref 0.1–0.8)
Neutrophils % Auto: 51.6 %
Neutrophils Abs Auto: 2.6 10*3/uL (ref 1.8–7.7)
Platelet Count: 274 10*3/uL (ref 130–400)
RDW: 13 % (ref 0.0–14.7)
Red Blood Cell Count: 3.52 10*6/uL — ABNORMAL LOW (ref 4.50–5.90)
White Blood Cell Count: 5 10*3/uL (ref 4.5–11.0)

## 2020-09-29 LAB — MAGNESIUM (MG): Magnesium (Mg): 1.9 mg/dL (ref 1.6–2.4)

## 2020-09-29 LAB — POC SARS-COV-2 + FLU A/B
POC Influenza A: NOT DETECTED
POC Influenza B: NOT DETECTED
POC SARS-COV-2: NOT DETECTED

## 2020-09-29 LAB — PHOSPHORUS (PO4): Phosphorus (PO4): 3.5 mg/dL (ref 2.5–4.5)

## 2020-09-29 NOTE — Clinical Case Management (Signed)
Clinical Case Management Assessments    Name: Roy Henry  MRN: [id number]   Date of Birth: 1971/06/06 (30yr) Gender: male    Note Date: 09/29/2020 Note Time: 11:37       DISCHARGE PLANNING NOTE    Patient was transferred from Networked reference to record EAF .  Takeback Letter: Not Applicable    Permanent Address: [address]  [place] [address]    Discharge Address: SNF - [facility]: [address] [place], [place] [address]     Patient can follow-up with:   PCP: Patient, No Pcp Per  / Phone Number: None  Preferred Pharmacy: [facility], [place], [phone] [facility]    Funding/Billing: Payor: MEDICARE / Plan: MEDICARE PART A&B / Product Type: *No Product type* /    Secondary Insurance: PARTNERSHIP HP [place]      Reason for admission: 09/21/2020 PROCEDURE: open ventral hernia and right inguinal hernia repair with mesh    Patient able to participate in plan?: Yes  Living arrangements: Other (comment)  Type of Residence: skilled nursing facility  Home Environment (floors/stairs): [facility] [phone]  Who will be the primary caregiver after discharge?  Phone #? : Burke Keels [phone]  Pre-Hospitalization self-care deficits: Bathing, Toileting, Eating  Pre-Hospitalization mobility: Dependent severe assistance required  Type of home health care services in place: None  Anticipated home health care services: None  Anticipated Home Infusion: None  DME in place: None  Anticipated DME: None  Patient's goal upon discharge (in patient's own words) : back to [facility]  Anticipated Disposition: SNF  Has provider discussed post-acute care options with the patient?: Yes  Does the patient have ongoing DC Planning needs?: Yes  Is the patient ready for discharge today?: Yes  Does this patient have a planned readmission?: No  Anticipated Barriers to Discharge: None    Discharge Disposition: Skilled nursing facility  Medically stable  for discharge/transfer per : HO / LYO  Transfer consent complete?: N/A  MD to MD contact made?: N/A  Chart copied?: Yes  Copies of images requested?: N/A  D/C Summary:  (yes)  Ambulance RX: Yes  Admit Orders:  (yes)  CXR or TB test: N/A  Patient/family provided with long-term care community resources: Yes  Above documentation sent with patient?: Yes      SNF Facility Name: [facility]  Ph: [phone]    [address], [place] [address]  Has facility accepted the patient?: Yes  Facility fax number: [phone]  Facility point of contact/phone number: Cloyde Reams / Almyra Free [phone]  Name of accepting MD: [facility]    Phone number for nurse to call report: [phone]      Transportation needed?: Yes  Mode of transportation: [facility] [phone]  Patient alert and oriented for discharge transportation?: Yes  Mode of transportation is appropriate?: Yes  Demographic information confirmed with patient or responsible party?: Yes  Level of transportation: BLS  Comments: BLE contractures; fall risk; s/p hernia repair    The list of accepting post-acute providers/ the pt choice letter and the quality rating information has been provided to the pt/ or designated decision maker.    Comments:   Spoke with [place] team re: DCP - anticipate DC back to [facility] today.  Team will work on DC summary and SNF orders. COVID testing ordered this AM - resulted, NEG.  Chart copy requested, transport form attached. Will have team  sign prior to pick up.    Messaged [facility] via allscripts and left voicemail, pt is medically stable for DC. Awaiting return call.    CM to follow.    ADDENDUM 1153:  Confirmed acceptance and admission for today at [facility] - spoke with admission coordinator Almyra Free.   Will fax paperwork when completed.    ADDENDUM 1402:  DC sum and SNF orders completed.  Scheduled 430PM pick up with [facility].    Updated team and bedside RN Jamas Lav. Requested for RN to call report when  able.  Faxed DC paperwork to [facility]    CM to follow.    Follow-up/recommendations:  DC back to [facility] via [facility]      Date/Time: 09/29/2020 11:37  Electronically Signed by:   Fredirick Lathe, Case Manager  Pager: [phone]

## 2020-09-29 NOTE — Nurse Focus (Signed)
Called [facility].  Nurse unable to take report.  Left phone number with facility to have nurse call back.

## 2020-09-29 NOTE — Discharge Instructions (Signed)
SPECIALTY SURGERY - DISCHARGE INSTRUCTIONS  Foregut, Metabolic, General & Colorectal Surgery     You were hospitalized at [facility] from 09/21/2020 to 09/29/2020.  You were hospitalized for surgical repair of your abdominal wall hernia.    OPERATIONS/PROCEDURES     09/21/2020 (Dr. Lyo/Dr. Janie Morning):   Open incisional hernia repair with mesh, posterior component separation, right inguinal hernia repair with mesh    DISCHARGE PLAN BY PROBLEM     - Continue to eat a regular diet   - Take pain medications only as needed, try and take as little narcotics as necessary  - Please take the stool softeners daily to help prevent constipation   - Continue to take your other home medications as previously prescribed  - Please work with the physical therapists so you can continue rehab outside of the hospital   - Return to clinic in 1 week for staple removal and overall wound check  - Please make sure the nurses are doing wound care daily. Make sure that you get turned every 2 hours to prevent pressure sores  - Place interdry in groins  - Please make sure that the nurses are caring for your two drains.    ACTIVITY RESTRICTIONS     You may shower. Please remove dressings and clean wounds.  You may gently rinse wounds (do not scrub) then pat dry with towel as needed.  You may continue wound care as instructed during your hospitalization.  Continue IS breathing exercises every 2 hours while awake for 2-4 weeks.  Avoid strenuous activity or heavy lifting (>10 pounds) for the next 4 weeks.  Do NOT bath or submerge in water (lake, swimming pool, etc) for 4 weeks.  Do NOT drive while taking narcotic pain medication (Example: Oxycodone).  Do NOT drive if pain prevents you from reacting quickly and safely.  Other specific restrictions not listed above: As tolerated   Must wear abdominal binder while participating with PT    DIET RESTRICTIONS     You may resume your normal diet, no restrictions.  Try to get 20 to 30 grams of  fiber each day through your diet.  Examples: Fruits, vegetables, whole-grain foods, and legumes.  Choose foods that contain healthy fats (unsaturated fats, omega-3 fat).  Examples: soybean, canola, olive, corn, and safflower oils.  Examples: salmon, tuna, and trout, and in walnuts and flaxseed.  Avoid unhealthy fats and oils (cholesterol, saturated fat, trans fat).  Avoid foods and liquids high in sodium (such as packaged foods).  Avoid foods and liquids high in sugar (such as sweets and soda).  If you smoke, please stop smoking.  Cigarette smoking will prevent proper wound healing.  Cigarette smoking will predispose you to forming blood clots.  Cigarettes and cigars can cause lung and heart damage.  Please ask if you would like assistance with smoking cessation.  Limit or do not drink alcohol as directed.  Alcohol can damage your heart and raise your blood pressure.  For women > 21 and men > 65  General recommended limit of 1 drink a day  Do not have more than 3 drinks in a day or 7 in a week  For men 8 to 51 years of age  General recommended limit of 2 drinks a day  Do not have more than 4 drinks in a day or 14 in a week    MEDICATION INSTRUCTIONS     Resume all home medications as directed unless otherwise instructed.  Start new  discharge medications as directed in your discharge instructions.  If there is any question, double check with your doctors or your PCP.  SNF does NOT need to provide Botox  If you were prescribed a narcotic medication:  Do NOT drive or operate machinery while taking this medication.  Take with over the counter stool softeners to avoid constipation.  If you were prescribed a medication containing Tylenol (Acetaminophen):  Do NOT exceed 4 grams (4000 milligrams) of Tylenol per day.  If you have liver disease, ask your doctor or PCP before taking tylenol.    OPIOID TITRATION INSTRUCTIONS     You may have been prescribed a medication containing an opioid (hydrocodone, oxycodone, morphine,  hydromorphone). These medications are meant to be temporary for pain relief, and to treat ACUTE pain due to injury or surgery. You should take them ONLY when your other pain medications such as acetaminophen (TYLENOL) or ibuprofen (MOTRIN) are not enough to relieve pain. They may not take away your pain completely, but may help make your pain tolerable. You should start taking fewer pills than prescribed every day - as your pain level allows - in order to prevent dependence - for example, if you are prescribed 1-2 pills every 4 hours, try taking 1 pill every 4 hours, and then the next day, try taking 1 pill every 8 hours, until you no longer feel that you need this medication. If you have any questions, ask your nurse before discharge, or call our clinic at the number listed below. If you have increasing pain, or cannot titrate down off of the medication, call our clinic ASAP, as these symptoms can be a sign of your injury or illness getting worse. If you have any concerns or questions, please call our clinic at [phone].    MANAGEMENT OF CONSTIPATION     It is very common to have constipation after surgery and/or from pain medications.  If prescribed a narcotic medication, please take the stool softeners prescribed for you (Colace, Miralax).  If you have not had a bowel movement in >4 days, please call our clinic at [phone].    WOUND CARE INSTRUCTIONS     HOW TO TAKE CARE OF YOUR WOUND(S):  Keep your wound(s) clean and dry.  Dry dressings changed at least once a day.  Watch for signs of infection of your wound. These are pain, redness, swelling, drainage or fever.   If you notice any of these symptoms please call our clinic at [phone] or seek medical attention.    Management of intertrigo:  ApplyInterdry Ag textile (gray cloth) alonggroins, skin folds.  Please conform the textile so that part of the textile is on direct contact with damaged skin and part of it is exposed to air so it wicks away  moisture from skin.   Change Interdry Ag evey 5 days. Do not toss old Interdry Ag, it can be washed with water only, and let air dry for re- use for up to 3 months.  Interdry Ag is designed to wick away moisture from skin and it is impregnated with silver for antimicrobial/antifungal effects.    Management of surgical incision:  Continue placing interdry Ag from the groin to cover the lower abdomen and incision and then cover this with an abdominal pad.  Please do not remove the staples. These will be removed in clinic. Patient can shower with staples in place. Please do not scrub at the incision.    Overall wound care:  Apply Mepilex border  dressing to sacrum - change every 3 days and as needed for soilage  Turn and reposition 30 degrees lateral every 2 hours or more frequently as needed  Please maintain head of bed at 30 degrees or less if no contraindications  Continue with waffle mattress while in bed  Please off load weight from bilateral heels using WAFFLE boots  Manage moisture, use condom catheter /flexiseal or rectal pouch as indicated   Avoid diapers, except during therapy   Use white breathable pads for better skin microclimate   Continue with efforts to optimize hydration and nutrition      Surgery with Staples:  You will see that there are staples on your abdomen. These are temporary.  You will need to come to clinic in about 1 week to have these removed.  You may shower with soap and water once you remove the bandage 2 days after surgery.  Let warm, soapy water run over the area. Do not scrub the wound.  Do not submerge in water (no baths, swimming, etc...) until it is fully healed.      FOLLOW UP APPOINTMENTS     Follow up with your Primary Care Provider:  For: Follow up, existing medication refills, additional pain medications and/or refills.  When: Within the next 1-2 weeks and as needed.  How: Please call your PCP to schedule an appointment.    Follow up in the General Surgery Clinic:  For:  Routine follow up and/or post-operative check and staple removal  When: An appointment has been scheduled for you on 10/08/20 at 11:30 am  How: Please call [phone] as soon as possible to confirm your appointment date.  Note: The general surgery clinic does not provide additional pain medications and/or refills.  If you have questions or want to schedule an appointment, please call the clinic at: [phone].    Future Appointments   Date Time Provider Department Center   11/05/2020  3:00 PM Kathrin Penner, MD [facility]       WORRISOME SIGNS AND SYMPTOMS     Call [phone] or go to the Emergency Department near you for any symptoms such as fevers with temperature greater than 101.5 F, chills, persistent nausea and vomiting, increasing redness of your incisions, pus coming from your incisions, severe pain not controlled with medications, shortness of breath, excessive or increasing: chest pain, left arm pain, or leg pains, dizziness, palpitations or your heart racing in your chest; fainting, or for any other acute problems or illnesses.    Please call the General Surgery Nurse Advice Line at [phone] with questions or concerns.

## 2020-09-29 NOTE — Nurse Assessment (Signed)
ASSESSMENT NOTE    Note Started: 09/29/2020, 08:25     Initial assessment completed and recorded in EMR.  Report received from night shift nurse and orders reviewed. Plan of Care reviewed and appropriate, discussed with patient.  Jamas Lav Jyll Tomaro, Personal assistant

## 2020-09-29 NOTE — Nurse Discharge Note (Signed)
AHS reviewed with patient.   Patient communicated understanding. Patient PIV dc'd. Condom catheter left in place per patient preference to SNF. All patient belongings returned to patient. Including dentures, wallet, cell phone, charger, and waist pouch. Endorsed script for Oxycodone to [facility].

## 2020-09-29 NOTE — Medicare Assistant Surgeon Cert (Signed)
[place] Assistant Surgeon Certification Form    When services are provided in a teaching hospital, the Medicare program reimburses for the services of the faculty assistant surgeons only if a qualified resident in a training program is not available. Acceptable reasons for use of a Animator instead of a qualified resident may be any of the following: 1) exceptional medical circumstances; 2) standing policy of no resident involvement; 3)complex medical procedures. (See CMS Policy 161.1.7 Assistants at Surgery in [facility])    A signed certification statement is required if the billing of faculty assistant surgeon services are to occur.        Patient:     Roy Henry Date Of Service: 09/21/2020   Med Rec#: [id number] HAR#: [id number]     DOB: 1970-11-17 Admission Date: 09/21/2020   Surgical Procedure: Incisional & Inguinal hernia repair w/mesh    Discharge Date:    Surgeon: Dr. Shellia Cleverly Assistant Surgeon: Dr. Gar Ponto     Provider  Responding Instructions: Addend note & use dot phase ( .AsstSurgeonMedicare) to select appropriate response.     If services are to be billed for the assistant surgeon services of your [facility] faculty physician or billable provider, Medicare guidelines require a certification statement. Please continue on and select "ARE TO BE BILLED" to sign the Medicare Certification Statement.    If assistant surgeon services are not to be billed for your [facility] facility physician or billable provider please select option "ARE NOT TO BE BILLED."      Do not Publishing copy services to Medicare.      Report electronically signed by:  Shellia Cleverly, MD

## 2020-09-29 NOTE — Discharge Planning (AHS/AVS) (Signed)
You will be returning to your facility:  [facility]  [address], [place], [place] [address]  Ph: [phone]        You will be transported by [place] [phone]      [facility]      Here are some community resources should you need them in the future:     If you have questions about your private medical insurance including [facility] plans:  Call the customer service number located on your insurance card.     [place]  [email or web address].[place].net     Medicare                                                                           www.medicare.gov  [phone]     [facility] Department of Aging (CDA)  www.aging.[place].no  (800) [phone]     Medi-Cal  (916) [phone] For [facility] only    For all other counties, refer to website  FinancialFunk.com.pt     [place]  [phone]     If you have any further questions related to this matter, you can contact Clinical Case Management at [phone].

## 2020-09-29 NOTE — Care Plan (Signed)
Problem: Adult Inpatient Plan of Care  Goal: Plan of Care Review  Outcome: Ongoing, Progressing  Flowsheets (Taken 09/29/2020 0506)  Plan of Care Reviewed With: patient  Progress: improving  Outcome Summary: lift team turn, BLE contracted, pain controlled w/ Oxycodone, abdominal dressing intact, JP x2, condom cath w/ good uop, plan for SNF placement.

## 2020-09-29 NOTE — Nurse Focus (Signed)
Called and gave report to Maryjane Hurter, RN at [facility].

## 2020-10-05 ENCOUNTER — Telehealth: Payer: Self-pay | Admitting: Neurology

## 2020-10-05 NOTE — Telephone Encounter (Signed)
Patient called and was upset as he has triple hernia repair. The wrote his Valium as needed,not  every six ours . He feels he is crying and getting a feeling like he is going to a MULTIPLE SCLEROSIS   Relapse. His legs and R hand side feels pain, she is unable to stand    Requesting Valium 5 mg RTC and hydrorocodone  changed to RTC NOT PRN.Marland Kitchen   He feels like he is too much pain and will be getting a relapse  Please advisw he is in [facility] subacute care. Thank you .   Collier Flowers, RN

## 2020-10-06 ENCOUNTER — Other Ambulatory Visit: Payer: Self-pay | Admitting: Internal Medicine

## 2020-10-06 LAB — COMPREHENSIVE METABOLIC PANEL
Alanine Transferase (ALT): 6 U/L (ref <=41)
Albumin: 3.6 g/dL — ABNORMAL LOW (ref 4.0–4.9)
Alkaline Phosphatase (ALP): 64 U/L (ref 35–129)
Aspartate Transaminase (AST): 9 U/L (ref <=41)
Bilirubin Total: 0.1 mg/dL (ref <=1.2)
Calcium: 8.9 mg/dL (ref 8.6–10.0)
Carbon Dioxide Total: 24 mmol/L (ref 22–29)
Chloride: 106 mmol/L (ref 98–107)
Creatinine Serum: 0.55 mg/dL (ref 0.51–1.17)
E-GFR Creatinine (Male): >100 mL/min/{1.73_m2}
Glucose: 88 mg/dL (ref 74–109)
Potassium: 4.3 mmol/L (ref 3.4–5.1)
Protein: 5.9 g/dL — ABNORMAL LOW (ref 6.6–8.7)
Sodium: 141 mmol/L (ref 136–145)
Urea Nitrogen, Blood (BUN): 16 mg/dL (ref 6–20)

## 2020-10-06 LAB — CBC WITH DIFFERENTIAL
Basophils % Auto: 0.5 %
Basophils Abs Auto: 0 10*3/uL (ref 0.0–0.2)
Eosinophils % Auto: 1.9 %
Eosinophils Abs Auto: 0.1 10*3/uL (ref 0.0–0.5)
Hematocrit: 32.2 % — ABNORMAL LOW (ref 41.0–53.0)
Hemoglobin: 10.6 g/dL — ABNORMAL LOW (ref 13.5–17.5)
Lymphocytes % Auto: 39.7 %
Lymphocytes Abs Auto: 1.6 10*3/uL (ref 1.0–4.8)
MCH: 29.2 pg (ref 27.0–33.0)
MCHC: 33 % (ref 32.0–36.0)
MCV: 88.5 fL (ref 80.0–100.0)
MPV: 7.9 fL (ref 6.8–10.0)
Monocytes % Auto: 8.8 %
Monocytes Abs Auto: 0.3 10*3/uL (ref 0.1–0.8)
Neutrophils % Auto: 49.1 %
Neutrophils Abs Auto: 1.9 10*3/uL (ref 1.8–7.7)
Platelet Count: 371 10*3/uL (ref 130–400)
RDW: 13.4 % (ref 0.0–14.7)
Red Blood Cell Count: 3.63 10*6/uL — ABNORMAL LOW (ref 4.50–5.90)
White Blood Cell Count: 3.9 10*3/uL — ABNORMAL LOW (ref 4.5–11.0)

## 2020-10-07 ENCOUNTER — Encounter: Payer: Self-pay | Admitting: Physician Assistant

## 2020-10-07 MED ORDER — DIAZEPAM 5 MG TABLET: 5.0000 mg | ORAL_TABLET | Freq: Four times a day (QID) | ORAL | 5 refills | Status: DC

## 2020-10-07 NOTE — Progress Notes (Signed)
Foregut, Metabolic, and General Surgery   Follow-Up Visit Note      Patient WAS wearing a surgical mask  Droplet and Airborne precautions were followed when caring for the patient.   PPE used by provider during encounter: N95 mask      Chief Complaint: S/P incisional hernia repair, open Rives Stoppa repair with abdominal wall component separation, bilateral transversus abdominis release, anterior abdominal wall mesh and right inguinal hernia repair with mesh    HPI: This is a 50yr old male who underwent above stated surgery on 09/21/20 by Dr. Charleston Ropes for symptomatic incisional hernia. The surgery was uncomplicated. Patient returns to clinic for post-operative follow-up and staple removal..    Subjective:  The patient shares his pain is 9/10, pain in his foot and knees. He states they aren't giving him his pain medications where he is staying. He is staying and [facility]. He states he is receiving valium and oxycodone. He says the oxycodone does not work for him as well as the hydrocodone does. He used to take hydrocodone.    He denies any pain from his incision or abdominal pain. He denies any nausea, vomiting or constipation.    States not able to take as deep of breaths but denies dyspnea. He states this is from muscle spasms.     The patient denies redness, drainage, bleeding or swelling around the incision sites. An RN at the SNF has been changing the dressings on his incisions. He has been avoiding heavy lifting.    Fever/chills: Denies  Nausea/Vomiting: Denies  Diet: Regular  Bowel Function: Normalized, Denies constipation or diarrhea  Urine Function: Voiding without difficulty  Chest pain/dyspnea: denies  Pain: pain of foot and knees only, denies surgical pain.      Allergy:   Allergies   Allergen Reactions    Tegretol [Carbamazepine] Unknown-Explain in Comments     unknown       Medications:   Current Outpatient Medications:     Acetaminophen (TYLENOL) 500 mg Tablet, Take 2 tablets by mouth  every 6 hours., Disp: 100 tablet, Rfl: 0    Amlodipine (NORVASC) 10 mg Tablet, Take 10 mg by mouth every day., Disp: , Rfl:     Aspirin 81 mg EC Tablet, Take 1 tablet by mouth every morning., Disp: , Rfl:     Bacitracin 500 unit/g Topical Ointment, Apply 1 Application to the affected area 3 times daily., Disp: 28 g, Rfl: 0    Baclofen (LIORESAL) 10 mg Tablet, Take 2 tablets by mouth 4 times daily., Disp: , Rfl:     Diazepam (VALIUM) 5 mg Tablet, Take 1 tablet by mouth every 6 hours if needed (spasticity)., Disp: 30 tablet, Rfl:     Diazepam (VALIUM) 5 mg Tablet, Take 1 tablet by mouth every 6 hours. Take around the clock, not as needed. See separate rx for ADDITIONAL doses as needed., Disp: 60 tablet, Rfl: 5    Docusate (COLACE) 100 mg Capsule, Take 1 capsule by mouth 2 times daily., Disp: 60 capsule, Rfl: 0    Fluconazole (DIFLUCAN) 200 mg Tablet, Take 1 tablet by mouth every day., Disp: 3 tablet, Rfl: 0    Gabapentin (NEURONTIN) 100 mg Capsule, Take 1 capsule by mouth 3 times daily., Disp: 90 capsule, Rfl: 0    Hydrocodone-Acetaminophen (NORCO) 5-325 mg Tablet, Take 1 tablet by mouth., Disp: , Rfl:     Ibuprofen (MOTRIN) 600 mg Tablet, Take 1 tablet by mouth 3 times daily with meals. take with  food, Disp: 90 tablet, Rfl: 0    Lidocaine (LIDODERM) 5 %(700 mg/patch) Patch, Apply 1 patch to the skin every 24 hours. Apply to painful area (12 hours on, 12 hours off), Disp: 30 patch, Rfl: 0    Magnesium Hydroxide (MILK OF MAGNESIA) 400 mg/5 mL Liquid, Take 30 mL by mouth once daily if needed., Disp: 900 mL, Rfl: 0    Naloxone (NARCAN) 4 mg/actuation Nasal Spray, Use for opioid overdose. Give 1 spray in nostril. If no response / breathing slows down, repeat every 2 minutes until emergency help arrives., Disp: 2 each, Rfl: 0    onabotulinumtoxinA (BOTOX INJ), 0 Refill(s), Maintenance, Disp: , Rfl:     Oxycodone (ROXICODONE) 5 mg Tablet, Take 1 tablet by mouth every 4 hours if needed., Disp: 30 tablet, Rfl:  0    Pantoprazole (PROTONIX) 40 mg Delayed Release Tablet, Take 1 tablet by mouth every morning before a meal., Disp: 30 tablet, Rfl: 0    Polyethylene Glycol 3350 (MIRALAX) 17 gram Powder, Take 1 packet by mouth every morning. Mix in 4 to 8 oz of water, soda, coffee, juice or tea., Disp: 30 packet, Rfl: 0    Sennosides (SENOKOT) 8.6 mg Tablet, Take 2 tablets by mouth every day at bedtime., Disp: 60 tablet, Rfl: 0    Sulfamethoxazole-Trimethoprim (BACTRIM DS) 800-160 mg Tablet, Take 1 tablet by mouth 2 times daily., Disp: 6 tablet, Rfl: 0    VITAMIN D PO, Take 1,000 Units by mouth every morning., Disp: , Rfl:  Medications were reviewed and updated with the patient.      Objective:  BP 111/69   Pulse 71   Resp 16     General: Alert and oriented in NAD. Pt is on a gurney  Pulm: breathing comfortably on room air  Abdomen: Soft, nondistended, nontender. Midline incision is well approximated without swelling, erythema or drainage. Staples intact. Drain sites in lower quadrants covered, but appear to be healing well without signs of infection. Please see photos below.          LE: flexion contractures at bilateral hips and knees; swelling of right foot          Assessment: This is a 50yr old male s/p  incisional hernia repair, open Rives Stoppa repair with abdominal wall component separation, bilateral transversus abdominis release, anterior abdominal wall mesh and right inguinal hernia repair with mesh on 09/21/20 by Dr. Charleston Ropes. Pt presented today for a follow up.  He is doing well from a surgical standpoint and denies any abdominal pain. His staples were removed and tincture of benzoin and steri-strips were applied. Advised pt to follow up with PCP for rx of hydrocodone, especially considering that his pain is not related to his operation.        Plan: .  s/p  incisional hernia repair, open Rives Stoppa repair with abdominal wall component separation, bilateral transversus abdominis release, anterior abdominal wall  mesh and right inguinal hernia repair with mesh  Encounter for staple removal    Activity restrictions were reviewed with the patient:    - No heavy lifting anything greater than 10 pounds for 6 weeks post-operative   - Avoid any pushing and pulling activities of greater than 10 pounds for 6 weeks post-operative.    - Avoid bathtub use, hot tubs or swimming pools until all wounds are completely              Wound Care: Do not submerge incisions in a swimming pool,  Jacuzzi, or bath tub until all scabs are no longer present and incisions are well healed without crust formation (approximately 6 weeks post op).     Call the clinic if there is any increased drainage, increased redness around the wound, or if any fevers, chills, abdominal pain, or nausea or vomiting or if unable drink fluids and if any bleeding occurs.     Return to [facility]D for long term care.    Follow up as needed.      Patient verbalizes understanding of the above stated plan of care without any obvious barriers to learning.    A total of 30 minutes were spent with the patient, of which more than 50% was spent counseling and/or coordinating care as described in the assessment and plan. The patient understands and agrees with the plan of care as outlined.      Electronically signed by:   Gaylan Gerold, M.M.S., PA-C  Department of Surgery  Division of Foregut, Metabolic and [place] of [facility], [facility]  Pager # [phone]       Supervising Physician: Lazarus Gowda, MD

## 2020-10-07 NOTE — Telephone Encounter (Signed)
Spoke with zack from [facility] To schedule patient in to see MD tomorrow at 4:30PM.

## 2020-10-07 NOTE — Telephone Encounter (Signed)
Left vm to schedule urgent appointment please offer 10/08/20 at 8:30 or 4:30 with MD.

## 2020-10-07 NOTE — Telephone Encounter (Signed)
Please call in to the rehab facility or print and fax. He should take diazepam 5 mg every 6 h around the clock with and ADDITIONAL 5 mg every 6 hours as needed.     For a total of up to 10 mg every 6 hours. He has severe spasticity.

## 2020-10-07 NOTE — Patient Instructions (Addendum)
Continue with wound care for drain sites in bilateral low abdomen  Leave steri-strips on until they fall off or for 1-2 weeks.    Activity restrictions include no heavy lifting, pushing or pulling of greater than ten pounds for six weeks postoperative. May walk, jog or bicycle for exercise. If light exercise increases pain in the surgical area, back off on activities for one week and resume slowly.    Wound Care: Do not submerge incisions in a swimming pool, Jacuzzi, or bath tub until all scabs are no longer present and incisions are well healed without crust formation (approximately 6 weeks post operative).     Call the clinic if there is any increased drainage, increased redness     around the wound, or if any fevers, chills, abdominal pain, or nausea or vomiting or if     unable drink fluids and if any bleeding occurs.       Follow up as needed.    Follow up with your PCP for routine medical care.    Follow up with PCP for pain medication requests

## 2020-10-08 ENCOUNTER — Ambulatory Visit (HOSPITAL_BASED_OUTPATIENT_CLINIC_OR_DEPARTMENT_OTHER): Payer: Medicare Other | Admitting: Neurology

## 2020-10-08 ENCOUNTER — Ambulatory Visit: Payer: Medicare Other | Attending: Neurology | Admitting: Physician Assistant

## 2020-10-08 ENCOUNTER — Encounter: Payer: Self-pay | Admitting: Neurology

## 2020-10-08 VITALS — BP 111/69 | HR 71 | Resp 16

## 2020-10-08 VITALS — BP 111/79 | HR 69 | Temp 98.1°F | Resp 18

## 2020-10-08 DIAGNOSIS — M2450 Contracture, unspecified joint: Secondary | ICD-10-CM

## 2020-10-08 DIAGNOSIS — Z48815 Encounter for surgical aftercare following surgery on the digestive system: Secondary | ICD-10-CM | POA: Insufficient documentation

## 2020-10-08 DIAGNOSIS — Z4802 Encounter for removal of sutures: Secondary | ICD-10-CM | POA: Insufficient documentation

## 2020-10-08 DIAGNOSIS — Z8719 Personal history of other diseases of the digestive system: Secondary | ICD-10-CM

## 2020-10-08 DIAGNOSIS — G35 Multiple sclerosis: Secondary | ICD-10-CM

## 2020-10-08 DIAGNOSIS — G8389 Other specified paralytic syndromes: Secondary | ICD-10-CM

## 2020-10-08 NOTE — Patient Instructions (Addendum)
-   recommend taking diazepam 5 mg four times per day (every 6 hours) for spasticity  - OK to give additional 5 mg tab up to 4 times per day as needed and as tolerated.  - continue baclofen 20 mg four times per day  - his left hand and nails are dirty and needs better help with hygiene  - defer to primary care to restart hydrocodone (previously taking Norco 5 mg/ 325 mg qid per [place]).  - call PM&R to schedule appointment to assess severe spasticity and consider baclofen pump: SNF to call [phone] for scheduling.   - plan to check labs in April and follow up in [facility] April to restart rituximab.

## 2020-10-08 NOTE — Progress Notes (Signed)
[facility]  [address]  [place], [place]  [address]  Phone [phone] * Fax [phone]      PATIENT:  Roy Henry  MRN:         [id number]  DATE:        10/08/2020     History of Present Illness:   This is a 50yr old male w/ a history of multiple sclerosis, who is on rituximab for DMT, last administered by [facility] in 07/07/2020. Prescribed by Dr. Blenda Peals: 1000 mg every 6 months.     The onset of symptoms was when he was 36 years ago with symptoms of lower extremity weakness, progressing to inability to bear weight. Pt suspects he has had another relapse in the past year, since his spasticity and contractures progressively worsened. Roy Henry was previously on copaxone, tysabri and rituximab treatments in the past, all of which have worked well in the past, according to him. He also received multiple botox injections for his contractures before COVID, and that, according to patient, has helped with his symptoms as well. Ampyra was helpful in the past; however, was accidentaly stopped during his transfer from one acute rehab to another as pt believes they lost some records during the transfer. He restarted rituximab by Dr. Blenda Peals in 06/2020, 1000 mg every 6 months. Prior to that infusion, the other infusion was over two years prior. Roy Henry said that because of COVID-19 he had difficulty organizing transportation to get infusion at the center. When Roy Henry was on regular infusions, he reported to not have any relapses at that time.     After the recent rituximab infusion in 07/07/20, he was getting better with more left UE strength and movement in the right UE. Plan at last visit was that I was going to take over the rituximab infusions. There was a question if there was sufficient B cell suppression after only one dose of 1000 mg. I recommended a second 1000 mg induction dose. But we needed to hold it for surgery.      Since the last visit, he  had a hernia surgery on 09/21/20. He will be getting the staples removed today. He has noticed more weakness of the bilateral UE since the surgery. He has a video of himself using the left hand to place the right hand on a trapeze bar. There was some trace movement of the right fingers in the video. Since the surgery, he is no longer able to do this due to worsened right  UE paralysis and left arm / hand weakness. He attributes the worsening to not getting meds at the rehab, but more likely from deconditioning / postop decline.     Roy Henry recent hx is notable for multiple acute rehabilitation facilities since 01/2020 following surgery for necrotic bowel and SBO, then GLF and tendon rupture on his leg.     Pt is currently at the [facility] in [place].    Last MRI done in 2017. Might need large bore or open MRI due to leg contractures.     Residual MS symptoms include spasticity, contractures, LE sensation loss, chronic pain. He has full bladder and bowel control; however, using diapers given inability to void independently bc of positioning.       Past medical history:  Patient Active Problem List    Diagnosis Date Noted    Incisional hernia, without obstruction or gangrene 09/21/2020  Past Medical History:   Diagnosis Date    GERD (gastroesophageal reflux disease)     Hypertension     Immobility     bed/wheelchair bound    Multiple sclerosis (HCC)      Allergies:    Tegretol [Carbamazepine]    Unknown-Explain in Comments    Comment:unknown    Medications:     Current Outpatient Medications:     Acetaminophen (TYLENOL) 500 mg Tablet, Take 2 tablets by mouth every 6 hours., Disp: 100 tablet, Rfl: 0    Amlodipine (NORVASC) 10 mg Tablet, Take 10 mg by mouth every day., Disp: , Rfl:     Aspirin 81 mg EC Tablet, Take 1 tablet by mouth every morning., Disp: , Rfl:     Bacitracin 500 unit/g Topical Ointment, Apply 1 Application to the affected area 3 times daily., Disp: 28 g, Rfl: 0    Baclofen  (LIORESAL) 10 mg Tablet, Take 2 tablets by mouth 4 times daily., Disp: , Rfl:     Baclofen (LIORESAL) 20 mg Tablet, Take 1 tablet by mouth 4 times daily., Disp: 120 tablet, Rfl: 0    Diazepam (VALIUM) 5 mg Tablet, Take 1 tablet by mouth every 6 hours if needed (spasticity)., Disp: 30 tablet, Rfl:     Diazepam (VALIUM) 5 mg Tablet, Take 1 tablet by mouth every 6 hours. Take around the clock, not as needed. See separate rx for ADDITIONAL doses as needed., Disp: 60 tablet, Rfl: 5    Docusate (COLACE) 100 mg Capsule, Take 100 mg by mouth 2 times daily., Disp: , Rfl:     Docusate (COLACE) 100 mg Capsule, Take 1 capsule by mouth 2 times daily., Disp: 60 capsule, Rfl: 0    Fluconazole (DIFLUCAN) 200 mg Tablet, Take 1 tablet by mouth every day., Disp: 3 tablet, Rfl: 0    Gabapentin (NEURONTIN) 100 mg Capsule, Take 1 capsule by mouth 3 times daily., Disp: 90 capsule, Rfl: 0    Ibuprofen (MOTRIN) 600 mg Tablet, Take 1 tablet by mouth 3 times daily with meals. take with food, Disp: 90 tablet, Rfl: 0    Lidocaine (LIDODERM) 5 %(700 mg/patch) Patch, Apply 1 patch to the skin every 24 hours. Apply to painful area (12 hours on, 12 hours off), Disp: 30 patch, Rfl: 0    Magnesium Hydroxide (MILK OF MAGNESIA) 400 mg/5 mL Liquid, Take 30 mL by mouth once daily if needed., Disp: 900 mL, Rfl: 0    Naloxone (NARCAN) 4 mg/actuation Nasal Spray, Use for opioid overdose. Give 1 spray in nostril. If no response / breathing slows down, repeat every 2 minutes until emergency help arrives., Disp: 2 each, Rfl: 0    onabotulinumtoxinA (BOTOX INJ), 0 Refill(s), Maintenance, Disp: , Rfl:     Oxycodone (ROXICODONE) 5 mg Tablet, Take 1 tablet by mouth every 4 hours if needed., Disp: 30 tablet, Rfl: 0    Pantoprazole (PROTONIX) 40 mg Delayed Release Tablet, Take 40 mg by mouth every morning before a meal., Disp: , Rfl:     Pantoprazole (PROTONIX) 40 mg Delayed Release Tablet, Take 1 tablet by mouth every morning before a meal., Disp: 30  tablet, Rfl: 0    Polyethylene Glycol 3350 (MIRALAX) 17 gram Powder, Take 1 packet by mouth every morning. Mix in 4 to 8 oz of water, soda, coffee, juice or tea., Disp: 30 packet, Rfl: 0    Polyethylene Glycol 3350 (MIRALAX) 17 gram/dose Powder, Take 17 g by mouth every morning. Mix in 4 to  8 oz of water, soda, coffee, juice or tea., Disp: , Rfl:     Sennosides (SENOKOT) 8.6 mg Tablet, Take 2 tablets by mouth every day at bedtime., Disp: 60 tablet, Rfl: 0    Sulfamethoxazole-Trimethoprim (BACTRIM DS) 800-160 mg Tablet, Take 1 tablet by mouth 2 times daily., Disp: 6 tablet, Rfl: 0    VITAMIN D PO, Take 1,000 Units by mouth every morning., Disp: , Rfl:     Social history:  He lives in the acute rehab center. Has been there for the past 4 months since his hospitalizations 6 months ago. He reports smoking in the past, but stopped at the age of 48. No current alcohol or other recreational drug use.     Family history:   No family history of multiple sclerosis in family members. Young sister diagnosed with lupus at the age of 37.     Examination:  Temp: 36.7 C (98.1 F) (03/24 0837)  Temp src: Temporal (03/24 0837)  Pulse: 69 (03/24 0837)  BP: 111/79 (03/24 0837)  Resp: 18 (03/24 0837)  SpO2: --  Height: --  Weight: --     General Physical Exam:  General:  Alert, oriented, pleasant. In a gurney, w/ severe contractures of lower extremities.  HEENT:  Unremarkable.  Extremities:  Of note was poor hygiene with food remnants on the left hand and dirt under the fingernails of the left hand.     Neuro Exam:  Mental Status:   Alert, oriented, normal speech and language.    Cranial Nerves:  CN 2:        Pupils ERRL.  CN 3,4,6:  EOM full, horizontal nystagmus noted.  CN 5:        Sensation to LT intact bilaterally.    CN 7:        Facial symmetry normal.  CN 8:        Hearing intact to finger rub bilaterally.  CN 9, 10:  Palate elevation midline.  CN 11:      Trapezius strength 5/5 bilaterally.  CN 12:      Tongue protrusion  midline.    Motor: UE: right UE with complete plegia except for 2/5 finger flexion. Left UE is 3-4/5. 0/5 bilateral lower extremities with spasms on examination.       Labs/Imaging:   Normal CBC and CMP 09/29/20 except mild anemia.       [facility] labs from 03/2016:  Hep B sAb negative  Hep B sAg negative  Hep B cAb negative  Hep C negative  HIV negative      Assessment / Plan:  In summary, Roy Henry is a 50yr old male with a previous diagnosis of multiple sclerosis, advanced, now on rituximab. Severe LE contractures, spasticity, pain. RUE with paralysis, possible early contractures at elbow and shoulder. LUE with weakness that is worse since surgery. His contractures are severe, painful. He was not restarted on around the clock diazepam treatment of 5 mg qid since discharge from hospital. I also recommend additional 50 mg qid as needed up to a total of 10 mg qid. He should take these with the other qid meds like baclofen. OK to hold for oversedation. Defer to PCP for pain meds.     Recommend restarting rituximab with induction using two 1000 mg doses 14 days apart. Plan to delay until mid April so that he is healed after the last surgery. He also has healing skin ulcers on the right foot from abrasions related  to transfers.     #MS - advanced, previously off all DMTs for a few years and recently restarted rituximab in 06/2020.    - defer to primary care to restart hydrocodone (previously taking Norco 5 mg/ 325 mg qid per [facility]).  - plan to check labs in April / restart rituximab in mid April.    #Spasticity / contractures.   - recommend taking diazepam 5 mg four times per day (every 6 hours) for spasticity  - OK to give additional 5 mg tab up to 4 times per day as needed and as tolerated.  - continue baclofen 20 mg four times per day   - call PM&R to schedule appointment to assess severe spasticity and consider baclofen pump: SNF to call [phone] for scheduling.    - patient is scheduled with Dr. Baxter Flattery for  Botox clinic for spasticity management and contracture treatment. PT will be important at the SNF as well.     #SNF issues:  - his left hand and nails are dirty and needs better help with hygiene  - wound care  - PT - stretching, strengthening, contracture prevention    I will see the patient in followup in 4 weeks to review labs and reorder rituximab.    Total time I spent in care of this patient today (excluding time spent on other billable services) was 60 minutes.      Sincerely,    Reymundo Poll, MD, PhD  MS Clinic Director  Health Sciences Clinical Professor of Neurology  [facility]

## 2020-10-08 NOTE — Telephone Encounter (Signed)
Orders faxed  10/07/20 to SNF Fax confirmation received.   Collier Flowers, RN. Message left to call if any questions.   Collier Flowers, RN

## 2020-10-08 NOTE — Nursing Note (Signed)
.  Vital signs taken, screened for pain, pharmacy verified, allergies reviewed, tobacco questions asked.  Roy Henry

## 2020-10-08 NOTE — Nursing Note (Signed)
Blood pressure 111/69, pulse 71, resp. rate 16.  Patient WAS wearing a surgical mask  Contact precautions were followed when caring for the patient.   PPE used by provider during encounter: Surgical mask and Face Shield/Goggles  /Patient's name and DOB verified. Vital signs taken, screened for pain, allergies and pharmacy verified.     Junice Fei, MAII

## 2020-10-09 DIAGNOSIS — M2450 Contracture, unspecified joint: Secondary | ICD-10-CM | POA: Insufficient documentation

## 2020-10-09 DIAGNOSIS — G35 Multiple sclerosis: Secondary | ICD-10-CM | POA: Insufficient documentation

## 2020-10-09 DIAGNOSIS — G35D Multiple sclerosis, unspecified: Secondary | ICD-10-CM | POA: Insufficient documentation

## 2020-10-09 DIAGNOSIS — G8389 Other specified paralytic syndromes: Secondary | ICD-10-CM | POA: Insufficient documentation

## 2020-10-14 ENCOUNTER — Telehealth: Payer: Self-pay | Admitting: Neurology

## 2020-10-14 NOTE — Telephone Encounter (Signed)
Office notes faxed to Santiago Glad. @ [phone].   Fax confirmation received. She requested clarification on Valium doses.   Collier Flowers, RN

## 2020-10-14 NOTE — Telephone Encounter (Signed)
Nurse would like to go over med rates.  Please give nurse a call at her direct line.      Margot Ables [phone]

## 2020-11-05 ENCOUNTER — Ambulatory Visit (HOSPITAL_BASED_OUTPATIENT_CLINIC_OR_DEPARTMENT_OTHER): Payer: Medicare Other | Admitting: Neurology

## 2020-11-05 ENCOUNTER — Other Ambulatory Visit: Payer: Self-pay

## 2020-11-05 ENCOUNTER — Ambulatory Visit: Payer: Medicare Other | Attending: Neurology | Admitting: Neurology

## 2020-11-05 ENCOUNTER — Encounter: Payer: Self-pay | Admitting: Neurology

## 2020-11-05 VITALS — BP 124/88 | HR 56 | Temp 97.0°F | Resp 18 | Ht 73.0 in | Wt 170.0 lb

## 2020-11-05 DIAGNOSIS — G8389 Other specified paralytic syndromes: Secondary | ICD-10-CM | POA: Insufficient documentation

## 2020-11-05 DIAGNOSIS — G35 Multiple sclerosis: Secondary | ICD-10-CM

## 2020-11-05 DIAGNOSIS — R252 Cramp and spasm: Secondary | ICD-10-CM

## 2020-11-05 DIAGNOSIS — Z1159 Encounter for screening for other viral diseases: Secondary | ICD-10-CM

## 2020-11-05 MED ORDER — DALFAMPRIDINE ER 10 MG TABLET,EXTENDED RELEASE,12 HR
10.0000 mg | EXTENDED_RELEASE_CAPSULE | Freq: Two times a day (BID) | ORAL | 11 refills | Status: DC
Start: 2020-11-05 — End: 2020-11-09
  Filled 2020-11-05 – 2020-11-06 (×2): qty 60, 30d supply, fill #0

## 2020-11-05 MED ORDER — DIAZEPAM 5 MG TABLET: 10.0000 mg | ORAL_TABLET | Freq: Four times a day (QID) | ORAL | 5 refills | Status: DC

## 2020-11-05 NOTE — Progress Notes (Addendum)
[facility]  [address]  [place], [place]  [address]  Phone [phone] * Fax [phone]      PATIENT:  Roy Henry  MRN:         [id number]  DATE:        10/08/2020     History of Present Illness:   This is a 50yr old male w/ a history of multiple sclerosis, who is on rituximab for DMT, last administered by [facility] in 07/07/2020. Prescribed by Dr. Blenda Peals: 1000 mg every 6 months.     Since the last visit, he had a hernia surgery on 09/21/20. He can't do the PT like he was before surgery because of a concern about the hernia repair integrity.  He had mesh placement. He needs the solar plexus to heal.    He thought he was here for steroids today. He continues to hope for some improvement so that he can be more independent. He would like to restart Ampyra. Prefers brand name because the generic did not work as well.     He has been working on the right UE doing his own PT and getting some movement now.     The legs are contracted and he hopes to get improvement in mobility (previously had benefit from the Botox at [facility]).     Pt is currently at the [facility] in [place]. Roy Henry recent hx is notable for multiple acute rehabilitation facilities since 01/2020 following surgery for necrotic bowel and SBO, then fall with a tendon rupture on his left leg.     Last MRI done in 2017. Might need large bore or open MRI due to leg contractures.     Residual MS symptoms include spasticity, contractures, LE sensation loss, chronic pain. He has normal bladder and bowel control; however, using diapers given inability to void independently due to leg paralysis.     He had COVID in 07/2019 (Omicron), mild case.     MS History:  The onset of symptoms was when he was 36 years ago in 2007 with symptoms of lower extremity weakness, progressing to inability to bear weight. Roy Henry was previously on copaxone, tysabri and rituximab treatments in the past, all of  which have worked well in the past, according to him. He also received multiple botox injections for his contractures before COVID, and that, according to patient, has helped with his symptoms as well. Ampyra was helpful in the past; however, was accidentaly stopped during his transfer from one acute rehab to another as pt believes they lost some records during the transfer. He restarted rituximab by Dr. Blenda Peals in 06/2020, 1000 mg every 6 months. Prior to that infusion, the other infusion was over two years prior. Roy Henry said that because of COVID-19 he had difficulty organizing transportation to get infusion at the center. When Roy Henry was on regular infusions, he reported to not have any relapses at that time.     After the recent rituximab infusion in 07/07/20, he was getting better with more left UE strength and movement in the right UE. Plan at last visit was that I was going to take over the rituximab infusions. There was a question if there was sufficient B cell suppression after only one dose of 1000 mg. I recommended a second 1000 mg induction dose. But we needed to hold it for surgery.      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    Incisional hernia, without obstruction or gangrene 09/21/2020     Past Medical History:   Diagnosis Date    GERD (gastroesophageal reflux disease)     Hypertension     Immobility     bed/wheelchair bound    Multiple sclerosis (HCC)      Allergies:    Tegretol [Carbamazepine]    Unknown-Explain in Comments    Comment:unknown    Medications:     Current Outpatient Medications:     Acetaminophen (TYLENOL) 500 mg Tablet, Take 2 tablets by mouth every 6 hours., Disp: 100 tablet, Rfl: 0    Amlodipine (NORVASC) 10 mg Tablet, Take 10 mg by mouth every day., Disp: , Rfl:     Aspirin 81 mg EC Tablet, Take 1 tablet by  mouth every morning., Disp: , Rfl:     Bacitracin 500 unit/g Topical Ointment, Apply 1 Application to the affected area 3 times daily., Disp: 28 g, Rfl: 0    Baclofen (LIORESAL) 10 mg Tablet, Take 2 tablets by mouth 4 times daily., Disp: , Rfl:     Diazepam (VALIUM) 5 mg Tablet, Take 1 tablet by mouth every 6 hours if needed (spasticity)., Disp: 30 tablet, Rfl:     Diazepam (VALIUM) 5 mg Tablet, Take 1 tablet by mouth every 6 hours. Take around the clock, not as needed. See separate rx for ADDITIONAL doses as needed., Disp: 60 tablet, Rfl: 5    Docusate (COLACE) 100 mg Capsule, Take 1 capsule by mouth 2 times daily., Disp: 60 capsule, Rfl: 0    Fluconazole (DIFLUCAN) 200 mg Tablet, Take 1 tablet by mouth every day., Disp: 3 tablet, Rfl: 0    Gabapentin (NEURONTIN) 100 mg Capsule, Take 1 capsule by mouth 3 times daily., Disp: 90 capsule, Rfl: 0    Hydrocodone-Acetaminophen (NORCO) 5-325 mg Tablet, Take 1 tablet by mouth., Disp: , Rfl:     Ibuprofen (MOTRIN) 600 mg Tablet, Take 1 tablet by mouth 3 times daily with meals. take with food, Disp: 90 tablet, Rfl: 0    Lidocaine (LIDODERM) 5 %(700 mg/patch) Patch, Apply 1 patch to the skin every 24 hours. Apply to painful area (12 hours on, 12 hours off), Disp: 30 patch, Rfl: 0    Magnesium Hydroxide (MILK OF MAGNESIA) 400 mg/5 mL Liquid, Take 30 mL by mouth once daily if needed., Disp: 900 mL, Rfl: 0    Naloxone (NARCAN) 4 mg/actuation Nasal Spray, Use for opioid overdose. Give 1 spray in nostril. If no response / breathing slows down, repeat every 2 minutes until emergency help arrives., Disp: 2 each, Rfl: 0    onabotulinumtoxinA (BOTOX INJ), 0 Refill(s), Maintenance, Disp: , Rfl:     Oxycodone (ROXICODONE) 5 mg Tablet, Take 1 tablet by mouth every 4 hours if needed., Disp: 30 tablet, Rfl: 0    Pantoprazole (PROTONIX) 40 mg Delayed Release Tablet, Take 1 tablet by mouth every morning before a meal., Disp: 30 tablet, Rfl: 0    Polyethylene Glycol 3350  (MIRALAX) 17 gram Powder, Take 1 packet by mouth every morning. Mix in 4 to 8 oz of water, soda, coffee, juice or tea., Disp: 30 packet, Rfl: 0    Sennosides (SENOKOT) 8.6 mg Tablet, Take 2 tablets by mouth every day at bedtime., Disp: 60 tablet, Rfl: 0  Sulfamethoxazole-Trimethoprim (BACTRIM DS) 800-160 mg Tablet, Take 1 tablet by mouth 2 times daily., Disp: 6 tablet, Rfl: 0    VITAMIN D PO, Take 1,000 Units by mouth every morning., Disp: , Rfl:     Social history:  He lives in the acute rehab center. Has been there for the past 4 months since his hospitalizations 6 months ago. He reports smoking in the past, but stopped at the age of 65. No current alcohol or other recreational drug use.     Family history:   No family history of multiple sclerosis in family members. Young sister diagnosed with lupus at the age of 14.     Examination:  Temp: 36.1 C (97 F) (04/21 1008)  Temp src: Skin (04/21 1008)  Pulse: 59 (04/21 1008)  BP: 131/91 (04/21 1008)  Resp: 18 (04/21 1008)  SpO2: --  Height: 185.4 cm (6\' 1" ) (04/21 1008)  Weight: 77.1 kg (170 lb) (04/21 1008)     General Physical Exam:  General:  Alert, oriented, pleasant. In a gurney, w/ severe contractures of lower extremities.  HEENT:  Unremarkable.  Extremities:  Of note was poor hygiene with food remnants on the left hand and dirt under the fingernails of the left hand.     Neuro Exam:  Mental Status:   Alert, oriented, normal speech and language.    Cranial Nerves:  CN 2:        Pupils ERRL.  CN 3,4,6:  EOM full, horizontal nystagmus noted.  CN 5:        Sensation to LT intact bilaterally.    CN 7:        Facial symmetry normal.  CN 8:        Hearing intact to finger rub bilaterally.  CN 9, 10:  Palate elevation midline.  CN 11:      Trapezius strength 5/5 bilaterally.  CN 12:      Tongue protrusion midline.    Motor: UE: right UE is improved with 3/5 deltoid and bicep, raises arm up against gravity. He also has improved ROM. Left UE is 3-4/5. 0/5 bilateral  lower extremities with spasms on examination. Passive ROM is improved today, able to extend at the knee on the right by about 30 degrees and on the left by about 15 degrees. Previously unable.     Labs/Imaging:   Normal CBC and CMP 09/29/20 except mild anemia.     [facility] labs from 03/2016:  Hep B sAb negative  Hep B sAg negative  Hep B cAb negative  Hep C negative  HIV negative      Assessment / Plan:  In summary, Roy Henry is a 50yr old male with a previous diagnosis of multiple sclerosis, advanced, now on rituximab. Severe LE contractures, spasticity, pain. RUE with paralysis, possible early contractures at elbow and shoulder. LUE with weakness that is worse since surgery. His contractures are severe, painful. He was not restarted on around the clock diazepam treatment of 5 mg qid since discharge from hospital. After this was restarted, he is doing better, with more mobility of the right UE and passive extension of the LEs. Recommend additional diazepam. OK to hold for oversedation. Defer to PCP for pain meds.     Recommend restarting rituximab with an induction using two 1000 mg doses 14 days apart. Plan to delay until mid April so that he is healed after the last surgery. He also has healing skin ulcers on the right foot from  abrasions related to transfers.     #MS - advanced, previously off all DMTs for a few years and recently restarted rituximab in 06/2020.  - labs ordered today  - once labs are back we will order rituixmab 1000 mg IV x 2 doses, two weeks apart, to be scheduled at [facility] infusion  - reorder Ampyra 10 mg bid for weakness in the arms and legs    #Spasticity / contractures.   - recommend increasing diazepam 10 mg four times per day (every 6 hours) for spasticity  - additional 5 mg tab diazepam up to 4 times per day as needed for spasticity, to relax muscles for a shower / bath, transfers or during therapy.  - continue baclofen 20 mg four times per day   - call PM&R to schedule appointment  to assess severe spasticity and consider baclofen pump: SNF to call [phone] for scheduling.    - patient is scheduled with Dr. Baxter Flattery for Botox clinic - able to extend the legs at the knees more on the right than the left side, limited by spasticity.   - continue passive ROM with PT and the CNAs at the SNF as well.     #SNF issues:  - wound care  - stretching, strengthening, contracture prevention    I will see the patient in followup in 3 months.    Total time I spent in care of this patient today (excluding time spent on other billable services) was 60 minutes.      Sincerely,    Reymundo Poll, MD, PhD  MS Clinic Director  Health Sciences Clinical Professor of Neurology  [facility]

## 2020-11-05 NOTE — Nursing Note (Signed)
Patient accompanied by Medical Transport.  Vital signs taken, allergies verified,  screened for pain, and verified immunization status: up to date.    *BP Repeated x 1     Patient WAS wearing a surgical mask  Droplet precautions were followed when caring for the patient.   PPE used by provider during encounter: Surgical mask    E. Renard Hamper, [facility]

## 2020-11-06 ENCOUNTER — Other Ambulatory Visit: Payer: Self-pay

## 2020-11-06 ENCOUNTER — Telehealth: Payer: Self-pay

## 2020-11-06 DIAGNOSIS — G35 Multiple sclerosis: Secondary | ICD-10-CM

## 2020-11-06 NOTE — Telephone Encounter (Signed)
Ampyra new start- chart reviewed, no  DDI's noted with pt's current medications, no seizure history noted. Scr 0.55, CrCl ~ 173 ml/min. Will submit PA to insurance and notify pt.     Wilfred Lacy, PharmD, CSP, MPH  Clinical Specialty Pharmacist   PHN [phone] ext [phone]

## 2020-11-06 NOTE — Telephone Encounter (Signed)
Lind Guest PA approved   Start date 07/18/2020 to 11/06/2021    [facility]  Specialty Technician  Specialty Pharmacy   P: [phone] Option 9 then  #4  11/06/20  4:21 PM

## 2020-11-06 NOTE — Telephone Encounter (Signed)
Pa for brand sent via cmm    [place]: [phone] Option 9 then  #4  11/06/20  2:54 PM

## 2020-11-06 NOTE — Telephone Encounter (Signed)
Apmyra PA sent to  [employer] via CMM (Key: [id number]) chart notes not attached not needed per ins    Awaiting response     [place]   P: [phone] Option 9 then  #4  11/06/20  2:52 PM

## 2020-11-09 ENCOUNTER — Other Ambulatory Visit: Payer: Self-pay

## 2020-11-09 MED ORDER — DALFAMPRIDINE ER 10 MG TABLET,EXTENDED RELEASE,12 HR
10.0000 mg | EXTENDED_RELEASE_CAPSULE | Freq: Two times a day (BID) | ORAL | 11 refills | Status: AC
Start: 2020-11-09 — End: 2021-11-04

## 2020-11-10 ENCOUNTER — Other Ambulatory Visit: Payer: Self-pay

## 2020-11-10 NOTE — Telephone Encounter (Signed)
Called pt to notify of approval and counsel on medication- LM for pt to return call.     Wilfred Lacy, PharmD, CSP, MPH  Clinical Specialty Pharmacist   PHN [phone] ext [phone]

## 2020-11-11 MED ORDER — DALFAMPRIDINE ER 10 MG TABLET,EXTENDED RELEASE,12 HR
10 | ORAL_TABLET | ORAL | 1 refills | 90.00000 days | Status: AC
Start: 2020-11-11 — End: 2021-04-20

## 2020-11-12 ENCOUNTER — Other Ambulatory Visit: Payer: Self-pay

## 2020-11-12 NOTE — Telephone Encounter (Signed)
Pt returned call- noted he recievbed brand Ampyra and has started it- had previously been on the medication before and declined counseling. Pt asked about status of "treatment". Reviewed most recent note and which indicated pt to be started on Rituxan following completion of labs. Pt requested call back from [place] to discuss next steps. Advised I would notify Janelle. Pt verbalized understanding of information. Instructed pt to call back with any non-urgent questions or concerns.     Wilfred Lacy, PharmD, CSP, MPH  Clinical Specialty Pharmacist   PHN [phone] ext [phone]

## 2020-11-16 ENCOUNTER — Encounter: Payer: Self-pay | Admitting: Family

## 2020-11-16 ENCOUNTER — Telehealth: Payer: Self-pay | Admitting: Family

## 2020-11-16 NOTE — Telephone Encounter (Signed)
Spoke with Thayer Ohm at [facility] and gave verbal per Dr. Joeseph Amor,     Charlsie [facility] NAME: AMPYRA Berkley Harvey approved)  dalfampridine 10 mg 12 hr tablet 180 tablet 1 11/11/2020     Sig: TAKE 1 TABLET BY MOUTH 2 TIMES A DAY    Sent to pharmacy as: dalfampridine ER 10 mg tablet,extended release,12 hr    E-Prescribing Status: Receipt confirmed by pharmacy (11/12/2020 12:01 PM PDT)          Pharmacist read back the order.

## 2020-11-16 NOTE — Telephone Encounter (Signed)
TC to patient to discuss rituximab and the need for him to complete the labs ordered by Dr. Lorenz Coaster prior to starting it.  VM left to return my call.  My Chart message sent.  Burnadette Peter, MSN, FNP-C  Neurology

## 2020-11-26 NOTE — Telephone Encounter (Signed)
Called [facility] [phone] and spoke with [place] desk nurse, 2 pt identifiers provided.  Advised that pt needs labs done for treatment.  Requested labs to be faxed to their facility and pt can get labs done through [facility], then we can call them back to follow up on results.  States pt can likely get labs done on Monday.    Lab orders faxed to [phone], fax confirmation received.    Windell Hummingbird, RN  Neurology

## 2020-11-30 ENCOUNTER — Ambulatory Visit
Payer: Medicare Other | Attending: Rehabilitative and Restorative Service Providers" | Admitting: Rehabilitative and Restorative Service Providers"

## 2020-11-30 VITALS — BP 132/78 | HR 70 | Temp 97.1°F

## 2020-11-30 DIAGNOSIS — G8250 Quadriplegia, unspecified: Secondary | ICD-10-CM | POA: Insufficient documentation

## 2020-11-30 DIAGNOSIS — M62838 Other muscle spasm: Secondary | ICD-10-CM | POA: Insufficient documentation

## 2020-11-30 DIAGNOSIS — Z79899 Other long term (current) drug therapy: Secondary | ICD-10-CM | POA: Insufficient documentation

## 2020-11-30 DIAGNOSIS — K432 Incisional hernia without obstruction or gangrene: Secondary | ICD-10-CM | POA: Insufficient documentation

## 2020-11-30 DIAGNOSIS — Z9889 Other specified postprocedural states: Secondary | ICD-10-CM | POA: Insufficient documentation

## 2020-11-30 DIAGNOSIS — Z8719 Personal history of other diseases of the digestive system: Secondary | ICD-10-CM | POA: Insufficient documentation

## 2020-11-30 DIAGNOSIS — G35 Multiple sclerosis: Secondary | ICD-10-CM | POA: Insufficient documentation

## 2020-11-30 DIAGNOSIS — M2450 Contracture, unspecified joint: Secondary | ICD-10-CM | POA: Insufficient documentation

## 2020-11-30 DIAGNOSIS — G8389 Other specified paralytic syndromes: Secondary | ICD-10-CM | POA: Insufficient documentation

## 2020-11-30 NOTE — Nursing Note (Signed)
Vital signs taken, allergies verified, screened for pain, pharmacy verified.  Patient WAS wearing a surgical mask  Contact precautions were followed when caring for the patient.   PPE used by provider during encounter: Surgical mask    Lilton Pare, MA

## 2020-11-30 NOTE — Telephone Encounter (Signed)
Called [facility] and spoke with nurse Joelle, 2 pt identifiers used.  Checked to see if labs have been completed.  States she will have labs done at next draw today around 3pm and requested for lab orders to be re-faxed.    Lab orders faxed to [facility] at [phone], fax confirmation received.    Windell Hummingbird, RN  Neurology

## 2020-11-30 NOTE — Progress Notes (Signed)
Physical Medicine and [facility] New Patient H&P/Initial Consultation Note    Date of visit: 11/30/2020    Patient seen today in PM&R for initial consultation at the request of Dr. Reymundo Poll, MD for evaluation of patient has severe bilateral lower extremity spasticity.    History he was obtained from the medical record, and the patient.    Chief Complaint   Patient presents with    Follow Up With Specialist    Spasticity   .  HISTORY OF PRESENT ILLNESS:   Roy Henry is a 50yr -old right-handed man with a history of multiple sclerosis with severe bilateral lower extremity spasticity.  The patient states that he was living with the assistance of an attendant through in-home support services in the [facility] area until approximately a year ago, and has lived in a skilled nursing facility ever since.  He states he is not getting much therapy at a skilled nursing facility but has been getting range of motion from the aids there to help stretch his lower extremities.  He remains relatively dependent for his bed mobility, transfers, toileting, etc.  He is on oral baclofen for his spasticity, however his spasticity remains severe.  He is being sent here for evaluation for possible baclofen pump placement.   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  The patient subsequently developed an incisional hernia.  The patient also was found to have a right femoral artery thrombosis and a symptomatic right inguinal hernia.  The patient subsequently underwent on 09/21/2020 open ventral hernia repair with mesh and possible abdominal wall reconstruction using transverse abdominal muscle release and possible right inguinal hernia repair with mesh.    Procedure Performed/Description:   Procedure(s):  REPAIR, HERNIA, INCISIONAL (N/A)  open Rives stoppa repair, CPT [phone]  INSERTION, MESH, ABDOMINAL WALL, ANTERIOR (N/A)  CPT [phone]  REPAIR, HERNIA, INGUINAL, UNILATERAL (Right)  COMPONENT SEPARATION, ABDOMINAL WALL (Bilateral) transversus abdominis release, CPT 15734 x2    Location: Bilateral lower extremities  Quality: Spasticity  Severity: Severe  Timing: Worse over the last year  Context: Associated with multiple sclerosis  Modifying factors: No significant improvement with maximum dose of oral baclofen.  Associated signs and symptoms: Possible contractures at the hips, and and knees.    PREVIOUS DIAGNOSTIC STUDIES:  The lab studies, imaging results, and the actual images of the studies below were personally reviewed with the patient:   Images:  09/25/2020: Abdominal x-ray: These films were personally reviewed by Dr Franchot Erichsen who agrees with following impression:  Non-obstructive bowel gas pattern. Surgical staples to the left of midline.  Surgical drains project over the abdomen. Unchanged osseous structures.  IMPRESSION:  1. Nonobstructive bowel gas pattern.      Summary as in HPI  Labs:   Results for Roy, Henry (MRN [id number]) as of 12/01/2020 16:03   Ref. Range 10/06/2020 14:50   Sodium Latest Ref Range: 136 - 145 mmol/L 141   Potassium Latest Ref Range: 3.4 - 5.1 mmol/L 4.3   Chloride Latest Ref Range: 98 - 107 mmol/L 106   Carbon Dioxide Total Latest Ref Range: 22 - 29 mmol/L 24   Urea Nitrogen, Blood (BUN) Latest Ref Range: 6 - 20 mg/dL 16   Creatinine Blood Latest Ref Range: 0.51 - 1.17 mg/dL 0.55   Glucose Latest Ref  Range: 74 - 109 mg/dL 88   Calcium Latest Ref Range: 8.6 - 10.0 mg/dL 8.9   Protein Latest Ref Range: 6.6 - 8.7 g/dL 5.9 (L)   Albumin Latest Ref Range: 4.0 - 4.9 g/dL 3.6 (L)   Alkaline Phosphatase (ALP) Latest Ref Range: 35 - 129 U/L 64   Aspartate Transaminase (AST) Latest Ref Range: <=41 U/L 9   Bilirubin Total Latest Ref Range: <=1.2 mg/dL 0.1   Alanine Transferase  (ALT) Latest Ref Range: <=41 U/L 6   Results for Roy, Henry (MRN [id number]) as of 12/01/2020 16:03   Ref. Range 10/06/2020 14:50   White Blood Cell Count Latest Ref Range: 4.5 - 11.0 K/MM3 3.9 (L)   Red Blood Cell Count Latest Ref Range: 4.50 - 5.90 M/MM3 3.63 (L)   Hemoglobin Latest Ref Range: 13.5 - 17.5 g/dL 10.6 (L)   Hematocrit Latest Ref Range: 41.0 - 53.0 % 32.2 (L)   MCV Latest Ref Range: 80.0 - 100.0 fL 88.5   MCH Latest Ref Range: 27.0 - 33.0 pg 29.2   MCHC Latest Ref Range: 32.0 - 36.0 % 33.0   RDW Latest Ref Range: 0.0 - 14.7 % 13.4   Platelet Count Latest Ref Range: 130 - 400 K/MM3 371     Current/Previous Treatments:  Currently on baclofen 20 mg 4 times daily, and Valium for spasticity.      CURRENT MEDICATIONS:   Outpatient Medications Marked as Taking for the 11/30/20 encounter (Office Visit) with Rolla Flatten, MD   Medication Sig Dispense Refill    Acetaminophen (TYLENOL) 500 mg Tablet Take 2 tablets by mouth every 6 hours. 100 tablet 0    Amlodipine (NORVASC) 10 mg Tablet Take 10 mg by mouth every day.      Aspirin 81 mg EC Tablet Take 1 tablet by mouth every morning.      Bacitracin 500 unit/g Topical Ointment Apply 1 Application to the affected area 3 times daily. 28 g 0    Baclofen (LIORESAL) 10 mg Tablet Take 2 tablets by mouth 4 times daily.      Dalfampridine (AMPYRA) 10 mg ER Tablet Take 1 tablet by mouth 2 times daily. Needs brand name, the generic didn't work for him. 60 tablet 11    Diazepam (VALIUM) 5 mg Tablet Take 1 tablet by mouth every 6 hours if needed (spasticity). 30 tablet     Diazepam (VALIUM) 5 mg Tablet Take 2 tablets by mouth every 6 hours. Take around the clock, not as needed. See separate rx for ADDITIONAL doses as needed. 60 tablet 5    Docusate (COLACE) 100 mg Capsule Take 1 capsule by mouth 2 times daily. 60 capsule 0    Fluconazole (DIFLUCAN) 200 mg Tablet Take 1 tablet by mouth every day. 3 tablet 0    Gabapentin (NEURONTIN) 100 mg Capsule Take 1  capsule by mouth 3 times daily. 90 capsule 0    Hydrocodone-Acetaminophen (NORCO) 5-325 mg Tablet Take 1 tablet by mouth.      Ibuprofen (MOTRIN) 600 mg Tablet Take 1 tablet by mouth 3 times daily with meals. take with food 90 tablet 0    Lidocaine (LIDODERM) 5 %(700 mg/patch) Patch Apply 1 patch to the skin every 24 hours. Apply to painful area (12 hours on, 12 hours off) 30 patch 0    Magnesium Hydroxide (MILK OF MAGNESIA) 400 mg/5 mL Liquid Take 30 mL by mouth once daily if needed. 900 mL 0    Naloxone (NARCAN)  4 mg/actuation Nasal Spray Use for opioid overdose. Give 1 spray in nostril. If no response / breathing slows down, repeat every 2 minutes until emergency help arrives. 2 each 0    Polyethylene Glycol 3350 (MIRALAX) 17 gram Powder Take 1 packet by mouth every morning. Mix in 4 to 8 oz of water, soda, coffee, juice or tea. 30 packet 0    Sennosides (SENOKOT) 8.6 mg Tablet Take 2 tablets by mouth every day at bedtime. 60 tablet 0    VITAMIN D PO Take 1,000 Units by mouth every morning.       ALLERGIES:  Allergies   Allergen Reactions    Tegretol [Carbamazepine] Unknown-Explain in Comments     unknown     Function:  Mobility  dependent for bed mobility and transfers   ADLs  dependent   Thx  currently not receiving therapies   DME/Orthotics  at a skilled nursing facility.  Today he arrives on a gurney     PAST MEDICAL HISTORY:  Past Medical History:   Diagnosis Date    GERD (gastroesophageal reflux disease)     Hypertension     Immobility     bed/wheelchair bound    Multiple sclerosis ([place])      SURGICAL HISTORY:  Past Surgical History:   Procedure Laterality Date    EXPLORATORY LAPAROTOMY  02/09/2020    SB resection and appendectomy for SBO, complicated by sepsis, SMV thrombosis    EXPLORATORY LAPAROTOMY      SB resection 2nd time    REPAIR, HERNIA, INCISIONAL  09/21/2020    open Clarise Cruz repair with abdominal wall component separation, bilateral transversus abdominis release, anterior  abdominal wall mesh and right inguinal hernia repair with mesh     SOCIAL HISTORY:  Social History     Socioeconomic History    Marital status: SINGLE     Spouse name: Not on file    Number of children: Not on file    Years of education: Not on file    Highest education level: Not on file   Occupational History    Not on file   Tobacco Use    Smoking status: Former Smoker    Smokeless tobacco: Never Used    Tobacco comment: stopped 14 yrs ago   Substance and Sexual Activity    Alcohol use: Not Currently    Drug use: Not Currently     Types: Marijuana     Comment: stopped 7 months ago    Sexual activity: Not on file   Other Topics Concern    Not on file   Social History Narrative    Not on file     Social Determinants of Health     Financial Resource Strain: Not on file   Food Insecurity: Not on file   Transportation Needs: Not on file   Physical Activity: Not on file   Stress: Not on file   Social Connections: Not on file   Intimate Partner Violence: Not on file   Housing Stability: Not on file     FAMILY HISTORY:  No family history on file.   Denies any significant family history of multiple sclerosis.    REVIEW OF SYSTEMS:   CONSTITUTIONAL: Denies fever or chills  PSYCH: Denies depression, states he usually is in a good mood.  HEENT: Denies swallowing difficulties  CARDIAC: Denies chest pain or palpitations  PULMONARY: Denies shortness of breath or cough  GI: States having regular bowel movements  GU: Denies  difficulty with bladder.  MUSCULOSKELETAL: Denies significant pain in upper or lower extremities  NEUROLOGIC: States spasticity in lower extremities as noted above and significant weakness in bilateral lower extremities and right upper extremity.  ENDOCRINE: Denies any significant weight gain  SKIN: Denies rashes or easy bruising    PHYSICAL EXAMINATION:  The patient arrived today on a gurney.  Temp: 36.2 C (97.1 F) (05/16 1556)  Temp src: Temporal (05/16 1556)  Pulse: 70 (05/16 1556)  BP:  132/78 (05/16 1556)  Resp: --  SpO2: 95 % (05/16 1556)  Height: --  Weight: --    Constitutional:  Alert, NAD.  Pleasant and cooperative.  Comprehends and answers questions appropriately.  Psych: Appropriate affect.   Eyes:  No dysconjugate gaze  ENT:  Hearing grossly intact  Cardiovascular: Heart is regular rate and rhythm.  Respiratory: Lungs are clear, no rales, ronchi, or wheezes  GI:  Soft, nontender, positive bowel sounds.  SKIN: No evidence of rash or skin breakdown on exposed skin.  Musculoskeletal:  On inspection: Patient with severe flexion and internal rotation spasticity and contractures at the hips and knees.  On ROM: Severe contractures at the knees and hips.  Neurologic:  On manual muscle testing: Left upper extremity strength testing with grip, wrist extension, elbow flexion, and elbow extension as well as shoulder flexion is approximately 4/5.  Right upper extremity strength testing has some extensor tone noted with 1/5 proximal muscle testing and 0/5 distally.  Strength testing was difficult in bilateral lower extremities however I was not able to see any significant voluntary muscle strength in bilateral lower extremities.  Cognition: Appears to be answering questions appropriately.  Knows his medical history relatively well.    IMPRESSION:   Roy Henry is a 50yr -old man with:  1. 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 Flexion contractures    4. S/P hernia repair      RECOMMENDATIONS:  Discussion - The above was discussed with the patient at today's visit.  The diagnoses, workup, treatment options, risks, and benefits of each were explained in detail, and the patient expressed sound understanding.  Per that discussion, the following consensus plan was reached:    #Spasticity: The patient has severe spasticity in bilateral lower extremities, has failed 80 mg of baclofen a day orally along with Valium, and has such severe spasticity that other oral medications will  likely not be that beneficial, therefore he may benefit from a baclofen pump.  I am concerned with possible pump placement due to abdominal and inguinal hernia repairs since the pump is usually placed in the abdominal region, therefore have been in contact with the patient's surgeon that performed the hernia repairs, Dr. Charleston Ropes, and the neurosurgeon that places the pumps, Dr. Milus Banister from neurosurgery.  Therefore we will proceed with the following:  -Referral to Dr. Milus Banister in neurosurgery to evaluate the patient's abdominal wall region to see if he feels that a pump could be placed from a neurosurgical standpoint.  If he feels that it can be placed, we will then set the patient up for a baclofen pump trial in order to see if he would benefit from intrathecal baclofen.  If it pump cannot be placed, then the patient may benefit from other neurosurgical intervention such as possible dorsal rhizotomy to assist with spasticity.  -Trial of adding tizanidine 4 mg p.o. 3 times daily to see if this has some benefit in improving his spasticity  as we evaluate for possible baclofen pump.  This can be gradually increased up to 36 mg a day.    Follow up -video visit in approximately 2 weeks to assess tizanidine, and possibly increase dosage.    The patient was given the following instructions:  There are no Patient Instructions on file for this visit.    Patient Instructions   1.  I will check with the surgeon as they performed your hernia repairs to see if it is possible to have a baclofen pump placed.  If so, we may then begin the process for evaluation for baclofen pump placement.  This would involve coming into the hospital for an injection into your spinal fluid with 50 mcg of baclofen, followed by several hours of evaluation to see if this helps decrease the spasticity in your legs.  2.  In the meantime, we are starting tizanidine 4 mg p.o. 3 times daily to help also decrease your spasticity.  If you are having any  lightheadedness, decreased blood pressure, then this medication may need to be decreased.  We can gradually increase this medication up to 36 mg a day if necessary, however we will do this gradually if you are able to tolerate it.  3.  Return to clinic for video visit in approximately 2-3 weeks.    Approximately 70 minutes was spent on this case, this included medical record review, discussing the case with another physicians, seeing and evaluating the patient, and medical documentation.    PM&R Staff Note  Electronically signed by:    Nathaniel Man, M.D.  [phone]  PM&R Attending Physician  [phone]

## 2020-11-30 NOTE — Patient Instructions (Signed)
1.  I will check with the surgeon as they performed your hernia repairs to see if it is possible to have a baclofen pump placed.  If so, we may then begin the process for evaluation for baclofen pump placement.  This would involve coming into the hospital for an injection into your spinal fluid with 50 mcg of baclofen, followed by several hours of evaluation to see if this helps decrease the spasticity in your legs.  2.  In the meantime, we are starting tizanidine 4 mg p.o. 3 times daily to help also decrease your spasticity.  If you are having any lightheadedness, decreased blood pressure, then this medication may need to be decreased.  We can gradually increase this medication up to 36 mg a day if necessary, however we will do this gradually if you are able to tolerate it.  3.  Return to clinic for video visit in approximately 2-3 weeks.

## 2020-12-03 NOTE — Telephone Encounter (Signed)
Lab results received.  Placed in Burnadette Peter NP mailbox for review.    Windell Hummingbird, RN  Neurology

## 2020-12-04 ENCOUNTER — Other Ambulatory Visit: Payer: Self-pay | Admitting: Family

## 2020-12-09 ENCOUNTER — Other Ambulatory Visit: Payer: Self-pay | Admitting: Neurology

## 2020-12-10 ENCOUNTER — Other Ambulatory Visit: Payer: Self-pay | Admitting: Surgery

## 2020-12-10 ENCOUNTER — Other Ambulatory Visit: Payer: Self-pay | Admitting: Gastroenterology

## 2020-12-10 DIAGNOSIS — Z1211 Encounter for screening for malignant neoplasm of colon: Secondary | ICD-10-CM

## 2020-12-11 MED ORDER — PEG 3350-ELECTROLYTES 236 GRAM-22.74 GRAM-6.74 GRAM-5.86 GRAM SOLUTION
ORAL | 0 refills | Status: DC
Start: 2020-12-11 — End: 2020-12-16

## 2020-12-15 ENCOUNTER — Telehealth: Payer: Self-pay | Admitting: Neurology

## 2020-12-15 NOTE — Telephone Encounter (Signed)
[facility] rec'd staff message from NP and authorization to schedule patient for [facility].     1st call: Left message for patient to call [facility] at [phone] opt #3 to schedule appointment.       Berda Shelvin   PSR III   [facility]

## 2020-12-16 ENCOUNTER — Ambulatory Visit: Payer: Medicare Other | Admitting: Rehabilitative and Restorative Service Providers"

## 2020-12-16 DIAGNOSIS — G8389 Other specified paralytic syndromes: Secondary | ICD-10-CM

## 2020-12-16 DIAGNOSIS — M24561 Contracture, right knee: Secondary | ICD-10-CM

## 2020-12-16 DIAGNOSIS — M2450 Contracture, unspecified joint: Secondary | ICD-10-CM

## 2020-12-16 DIAGNOSIS — M24562 Contracture, left knee: Secondary | ICD-10-CM

## 2020-12-16 DIAGNOSIS — G35 Multiple sclerosis: Secondary | ICD-10-CM

## 2020-12-16 DIAGNOSIS — M62838 Other muscle spasm: Secondary | ICD-10-CM

## 2020-12-16 DIAGNOSIS — M24551 Contracture, right hip: Secondary | ICD-10-CM

## 2020-12-16 DIAGNOSIS — M24552 Contracture, left hip: Secondary | ICD-10-CM

## 2020-12-16 NOTE — Progress Notes (Addendum)
Physical Medicine and Rehabilitation Clinic VIDEO VISIT Follow-Up Note    I performed this clinical encounter by utilizing a real time telehealth video connection between my location and the patient's location. The patient's location was confirmed during this visit. I obtained verbal consent from the patient to perform this clinical encounter utilizing video and prepared the patient by answering any questions they had about the telehealth interaction.    Date of visit: 12/16/2020    Patient seen today in PM&R for initial consultation at the request of Dr. Reymundo Poll, MD for evaluation of patient has severe bilateral lower extremity spasticity.    History he was obtained from the medical record, and the patient.    No chief complaint on file.  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  The patient states that he was living with the assistance of an attendant through in-home support services in the [facility] area until approximately a year ago, and has lived in a skilled nursing facility ever since.  He states he is not getting much therapy at a skilled nursing facility but has been getting range of motion from the aids there to help stretch his lower extremities.  He remains relatively dependent for his bed mobility, transfers, toileting, etc.  He is on oral baclofen for his spasticity, however his spasticity remains severe.  He is being sent here for evaluation for possible baclofen pump placement.   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  The patient subsequently developed an incisional hernia.  The patient also was found to have a right femoral artery thrombosis and a  symptomatic right inguinal hernia.  The patient subsequently underwent on 09/21/2020 open ventral hernia repair with mesh and possible abdominal wall reconstruction using transverse abdominal muscle release and possible right inguinal hernia repair with mesh.    INTERVAL HISTORY:  Since last consultation on 11/30/2020: The patient states that he is having about 50% improvement with regards to his spasms and spasticity.  He states now that the CNA's are able to stretch his legs a little bit further, and that he is able to have more range in his adductors in his legs making it easier for hygiene.  He states that he is having discomfort and pain in his tendons with the stretching and has been utilizing Norco to assist with this.  He remains on the tizanidine form milligrams every 8 hours.  I have discussed the case with neurosurgery, Dr. Milus Banister, who wishes to see the patient, evaluate his abdomen, to see whether or not he would be a candidate for baclofen pump, if oral medications are not effective for him.  I have sent a referral, pending appointment.    Procedure Performed/Description:  Procedure(s):  REPAIR, HERNIA, INCISIONAL (N/A)  open Rives stoppa repair, CPT [id number]  INSERTION, MESH, ABDOMINAL WALL, ANTERIOR (N/A)  CPT [phone]  REPAIR, HERNIA, INGUINAL, UNILATERAL (Right)  COMPONENT SEPARATION, ABDOMINAL WALL (Bilateral) transversus abdominis release, CPT 15734 x2    PREVIOUS DIAGNOSTIC STUDIES:  The lab studies, imaging results, and the actual images of the studies below were personally reviewed with the patient:   Images:  09/25/2020: Abdominal x-ray: These films were personally reviewed by Dr Franchot Erichsen who  agrees with following impression:  Non-obstructive bowel gas pattern. Surgical staples to the left of midline.  Surgical drains project over the abdomen. Unchanged osseous structures.  IMPRESSION:  1. Nonobstructive bowel gas pattern.      Summary as in HPI  Labs:  Labs were personally reviewed by Dr.  Franchot Erichsen  Results for RONALD, LONDO (MRN [id number]) as of 12/01/2020 16:03   Ref. Range 10/06/2020 14:50   Sodium Latest Ref Range: 136 - 145 mmol/L 141   Potassium Latest Ref Range: 3.4 - 5.1 mmol/L 4.3   Chloride Latest Ref Range: 98 - 107 mmol/L 106   Carbon Dioxide Total Latest Ref Range: 22 - 29 mmol/L 24   Urea Nitrogen, Blood (BUN) Latest Ref Range: 6 - 20 mg/dL 16   Creatinine Blood Latest Ref Range: 0.51 - 1.17 mg/dL 0.55   Glucose Latest Ref Range: 74 - 109 mg/dL 88   Calcium Latest Ref Range: 8.6 - 10.0 mg/dL 8.9   Protein Latest Ref Range: 6.6 - 8.7 g/dL 5.9 (L)   Albumin Latest Ref Range: 4.0 - 4.9 g/dL 3.6 (L)   Alkaline Phosphatase (ALP) Latest Ref Range: 35 - 129 U/L 64   Aspartate Transaminase (AST) Latest Ref Range: <=41 U/L 9   Bilirubin Total Latest Ref Range: <=1.2 mg/dL 0.1   Alanine Transferase (ALT) Latest Ref Range: <=41 U/L 6   Results for HASHIR, DELEEUW (MRN [id number]) as of 12/01/2020 16:03   Ref. Range 10/06/2020 14:50   White Blood Cell Count Latest Ref Range: 4.5 - 11.0 K/MM3 3.9 (L)   Red Blood Cell Count Latest Ref Range: 4.50 - 5.90 M/MM3 3.63 (L)   Hemoglobin Latest Ref Range: 13.5 - 17.5 g/dL 10.6 (L)   Hematocrit Latest Ref Range: 41.0 - 53.0 % 32.2 (L)   MCV Latest Ref Range: 80.0 - 100.0 fL 88.5   MCH Latest Ref Range: 27.0 - 33.0 pg 29.2   MCHC Latest Ref Range: 32.0 - 36.0 % 33.0   RDW Latest Ref Range: 0.0 - 14.7 % 13.4   Platelet Count Latest Ref Range: 130 - 400 K/MM3 371     CURRENT MEDICATIONS:   Outpatient Medications Marked as Taking for the 12/16/20 encounter (Telemedicine Scheduled) with Rolla Flatten, MD   Medication Sig Dispense Refill    Acetaminophen (TYLENOL) 500 mg Tablet Take 2 tablets by mouth every 6 hours. 100 tablet 0    Amlodipine (NORVASC) 10 mg Tablet Take 10 mg by mouth every day.      Aspirin 81 mg EC Tablet Take 1 tablet by mouth every morning.      Baclofen (LIORESAL) 10 mg Tablet Take 2 tablets by mouth 4 times daily.      Dalfampridine  (AMPYRA) 10 mg ER Tablet Take 1 tablet by mouth 2 times daily. Needs brand name, the generic didn't work for him. 60 tablet 11    Diazepam (VALIUM) 5 mg Tablet Take 1 tablet by mouth every 6 hours if needed (spasticity). 30 tablet     Diazepam (VALIUM) 5 mg Tablet Take 2 tablets by mouth every 6 hours. Take around the clock, not as needed. See separate rx for ADDITIONAL doses as needed. 60 tablet 5    Docusate (COLACE) 100 mg Capsule Take 1 capsule by mouth 2 times daily. 60 capsule 0    Gabapentin (NEURONTIN) 100 mg Capsule Take 1 capsule by mouth 3 times daily. 90 capsule 0    Hydrocodone-Acetaminophen (NORCO) 5-325 mg Tablet Take  1 tablet by mouth.      Lidocaine (LIDODERM) 5 %(700 mg/patch) Patch Apply 1 patch to the skin every 24 hours. Apply to painful area (12 hours on, 12 hours off) 30 patch 0    Magnesium Hydroxide (MILK OF MAGNESIA) 400 mg/5 mL Liquid Take 30 mL by mouth once daily if needed. 900 mL 0    Naloxone (NARCAN) 4 mg/actuation Nasal Spray Use for opioid overdose. Give 1 spray in nostril. If no response / breathing slows down, repeat every 2 minutes until emergency help arrives. 2 each 0    onabotulinumtoxinA (BOTOX INJ) 0 Refill(s), Maintenance      Polyethylene Glycol 3350 (MIRALAX) 17 gram Powder Take 1 packet by mouth every morning. Mix in 4 to 8 oz of water, soda, coffee, juice or tea. 30 packet 0    Sennosides (SENOKOT) 8.6 mg Tablet Take 2 tablets by mouth every day at bedtime. 60 tablet 0    VITAMIN D PO Take 1,000 Units by mouth every morning.       ALLERGIES:  Allergies   Allergen Reactions    Tegretol [Carbamazepine] Unknown-Explain in Comments     unknown     Function:  Mobility  dependent for bed mobility and transfers   ADLs  dependent   Thx  currently not receiving therapies   DME/Orthotics  at a skilled nursing facility.  Today he arrives on a gurney     REVIEW OF SYSTEMS:   CONSTITUTIONAL: Denies fever or chills  CARDIAC: Denies chest pain or palpitations  PULMONARY:  Denies shortness of breath or cough  MUSCULOSKELETAL: Has pain in his tendons with his stretching  NEUROLOGIC: States spasticity is about 50% less spasms per hour.  SKIN: Denies any current pressure sores    PHYSICAL EXAMINATION:  The patient remains at his facility, he is accompanied today by Anderson Malta his nurse.  BP: 108/76, HR 97  Temp: --  Temp src: --  Pulse: --  BP: --  Resp: --  SpO2: --  Height: --  Weight: --    Constitutional: Pleasant, cooperative, answering questions appropriately.  Psych: Appropriate affect, pleasant.   Eyes:  No dysconjugate gaze  ENT:  Hearing grossly intact    From previous examination 11/30/2020:  Cardiovascular: Heart is regular rate and rhythm.  Respiratory: Lungs are clear, no rales, ronchi, or wheezes  GI:  Soft, nontender, positive bowel sounds.  SKIN: No evidence of rash or skin breakdown on exposed skin.  Musculoskeletal:  On inspection: Patient with severe flexion and internal rotation spasticity and contractures at the hips and knees.  On ROM: Severe contractures at the knees and hips.  Neurologic:  On manual muscle testing: Left upper extremity strength testing with grip, wrist extension, elbow flexion, and elbow extension as well as shoulder flexion is approximately 4/5.  Right upper extremity strength testing has some extensor tone noted with 1/5 proximal muscle testing and 0/5 distally.  Strength testing was difficult in bilateral lower extremities however I was not able to see any significant voluntary muscle strength in bilateral lower extremities.  Cognition: Appears to be answering questions appropriately.  Knows his medical history relatively well.    IMPRESSION:   ESLEY BROOKING Henry is a 50yr -old man with:  1. 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 Flexion contractures      RECOMMENDATIONS:  Discussion - The above was discussed with the patient at today's visit.  The diagnoses, workup, treatment  options, risks, and benefits of each were  explained in detail, and the patient expressed sound understanding.  Per that discussion, the following consensus plan was reached:    #Spasticity: The patient has severe spasticity in bilateral lower extremities, has failed 80 mg of baclofen a day orally along with Valium, and has such severe spasticity that other oral medications may not be that beneficial, however I would like to continue to maximize the tizanidine, and consider utilization of Dantrium if necessary to maximize oral antispasmodic agents.    -Increase tizanidine to 6 mg p.o. every 8 hours monitoring of blood pressure to make sure that he is not getting any significant hypotension.  -Repeat video visit in approximately 3-4 weeks.  The tizanidine can be gradually increased for maximum dose of 36 mg a day.    -Referral to Dr. Milus Banister in neurosurgery previously placed to evaluate the patient's abdominal wall region to see if he feels that a pump could be placed from a neurosurgical standpoint.  If he feels that it can be placed, we will then set the patient up for a baclofen pump trial in order to see if he would benefit from intrathecal baclofen.  If it pump cannot be placed, then the patient may benefit from other neurosurgical intervention such as possible dorsal rhizotomy to assist with spasticity.    Follow up -video visit in approximately 2 weeks to assess tizanidine, and possibly increase dosage.    I spent a total of 40 minutes today on this patient's visit. This included 30 minutes of face to face time, more than 50% of which was spent in counseling or coordinating care, for the above-stated medical problems. The remainder of the time was spent on review of the patient record (including but not limited to clinical notes, outside records, laboratory and radiographic studies), medical decision-making, and documentation of the visit.    PM&R Staff Note  Electronically signed by:    Nathaniel Man, M.D.  [phone]  PM&R Attending  Physician  [phone]

## 2020-12-17 ENCOUNTER — Ambulatory Visit: Payer: Medicare Other | Admitting: NEUROLOGY

## 2020-12-31 ENCOUNTER — Telehealth: Payer: Self-pay

## 2020-12-31 NOTE — Telephone Encounter (Signed)
Spoke with the patient and informed that I am calling to schedule appointment however in the middle of conversation phone got disconnected , call back and left a message for patient to call back . Phone num was provided .

## 2021-01-04 ENCOUNTER — Telehealth: Payer: Self-pay

## 2021-01-04 NOTE — Telephone Encounter (Signed)
Patient identified by 2 identifiers: name/dob  You are scheduled in [facility] for Ruxience   Appointment date 01/05/2021 @ time10:30 am    New Patient: yes     Patient to arrive on the first floor of the [facility], Room 165. Please plan extra time for any screening at the front hospital entrance.    If you will need a wheelchair, please request one at the security/information desk just inside the main entrance.   Have you been ill with any recent cough, fever above 100 F, SOB, HA, vomiting, diarrhea, abdominal pain? no  Are any family members currently sick with respiratory illness and/or fever? no  Have you traveled in the last 21 days OR been in contact with anyone who is sick and has travelled in the last 21 days? no  If you are ill with a cold or flu please call your PCP prior to your appointment.     Rosana Hoes has updated what is considered "fully vaccinated" for visitors in the hospital.  Visitors must have received the booster to be considered "fully vaccinated". Of course, if visitors are not eligible for the booster yet, they will still be considered fully vaccinated until the booster is appropriate.  If the visitor does not have the required information, they will not be allowed to accompany you to your appointment.    Visitors who are not fully vaccinated will need to provide proof of a negative test taken within 24 hours prior to the visit (if an antigen test), 48 hours prior (if a PCR test), or 72 hours if they are a repeat visitor who has already provided one of these other tests at their prior visit.    Please contact [facility] [phone] if you have any questions or need to reschedule.   If you are going to arrive more than 15 minutes late, please call [facility].  For late arrivals, it may be necessary to reschedule your appointment for later in the day or for another day depending on what treatment you are receiving.      If you arrive late and do not call, we may have to reschedule  you once you arrive due to other scheduled appointments.      [facility]  [phone]  Sun-Sat/365 days a year     Comments:  Will the patient be needing transportation assistance to this appointment? No

## 2021-01-05 ENCOUNTER — Ambulatory Visit: Payer: Medicare Other

## 2021-01-05 ENCOUNTER — Ambulatory Visit
Admission: RE | Admit: 2021-01-05 | Discharge: 2021-01-05 | Disposition: A | Discharge status: Home / Self Care | Payer: Medicare Other | Source: Ambulatory Visit | Attending: Neurology | Admitting: Neurology

## 2021-01-05 VITALS — BP 134/96 | HR 70 | Temp 97.8°F | Resp 18

## 2021-01-05 DIAGNOSIS — G35 Multiple sclerosis: Secondary | ICD-10-CM | POA: Insufficient documentation

## 2021-01-05 MED ORDER — EPINEPHRINE 1 MG/ML (1 ML) INJECTION SOLUTION
0.3000 mg | INTRAMUSCULAR | Status: DC | PRN
Start: 2021-01-05 — End: 2021-01-05

## 2021-01-05 MED ORDER — DIPHENHYDRAMINE 25 MG CAPSULE
25.0000 mg | ORAL_CAPSULE | Freq: Once | ORAL | Status: AC
Start: 2021-01-05 — End: 2021-01-05
  Administered 2021-01-05: 25 mg via ORAL
  Filled 2021-01-05: qty 1

## 2021-01-05 MED ORDER — RITUXIMAB-PVVR 10 MG/ML INTRAVENOUS SOLUTION
1000.0000 mg | Freq: Once | INTRAVENOUS | Status: AC
Start: 2021-01-05 — End: 2021-01-05
  Administered 2021-01-05: 1000 mg via INTRAVENOUS
  Filled 2021-01-05: qty 100

## 2021-01-05 MED ORDER — NACL 0.9% IV INFUSION
INTRAVENOUS | Status: DC
Start: 2021-01-05 — End: 2021-01-05

## 2021-01-05 MED ORDER — MEPERIDINE (PF) 50 MG/ML INJECTION SYRINGE
25.0000 mg | INJECTION | INTRAMUSCULAR | Status: DC | PRN
Start: 2021-01-05 — End: 2021-01-05

## 2021-01-05 MED ORDER — ACETAMINOPHEN 325 MG TABLET
650.0000 mg | ORAL_TABLET | Freq: Once | ORAL | Status: DC
Start: 2021-01-05 — End: 2021-01-05

## 2021-01-05 MED ORDER — METHYLPREDNISOLONE SODIUM SUCCINATE 125 MG SOLUTION FOR INJECTION
100.0000 mg | Freq: Once | INTRAMUSCULAR | Status: AC
Start: 2021-01-05 — End: 2021-01-05
  Administered 2021-01-05: 100 mg via INTRAVENOUS
  Filled 2021-01-05: qty 2

## 2021-01-05 MED ORDER — DIPHENHYDRAMINE 50 MG/ML INJECTION SOLUTION
25.0000 mg | INTRAMUSCULAR | Status: DC | PRN
Start: 2021-01-05 — End: 2021-01-05

## 2021-01-05 MED ORDER — ALBUTEROL SULFATE 2.5 MG/3 ML (0.083 %) SOLUTION FOR NEBULIZATION
2.5000 mg | INHALATION_SOLUTION | RESPIRATORY_TRACT | Status: DC | PRN
Start: 2021-01-05 — End: 2021-01-05

## 2021-01-05 MED ORDER — ONDANSETRON HCL 8 MG TABLET
4.0000 mg | ORAL_TABLET | Freq: Three times a day (TID) | ORAL | Status: DC | PRN
Start: 2021-01-05 — End: 2021-01-05
  Filled 2021-01-05: qty 1

## 2021-01-05 MED ORDER — ALTEPLASE 2 MG INTRA-CATHETER SOLUTION
2.0000 mg | Status: DC | PRN
Start: 2021-01-05 — End: 2021-01-05

## 2021-01-05 MED ORDER — HEPARIN, PORCINE (PF) 100 UNIT/ML INTRAVENOUS SYRINGE
3.0000 mL | INJECTION | INTRAVENOUS | Status: DC | PRN
Start: 2021-01-05 — End: 2021-01-05

## 2021-01-05 MED ORDER — LORAZEPAM 0.5 MG TABLET
0.5000 mg | ORAL_TABLET | ORAL | Status: DC | PRN
Start: 2021-01-05 — End: 2021-01-05

## 2021-01-05 MED ORDER — FAMOTIDINE (PF) 20 MG/2 ML INTRAVENOUS SOLUTION
20.0000 mg | INTRAVENOUS | Status: DC | PRN
Start: 2021-01-05 — End: 2021-01-05

## 2021-01-05 MED ORDER — METHYLPREDNISOLONE SODIUM SUCCINATE 125 MG SOLUTION FOR INJECTION
125.0000 mg | INTRAMUSCULAR | Status: DC | PRN
Start: 2021-01-05 — End: 2021-01-05

## 2021-01-05 NOTE — Progress Notes (Signed)
[facility] Progress Note:    Patient arriving to [facility] for scheduled appointment. Patient arriving by gurney, accompanied by patient transport personnel (outside). Two patient identifers used, name band placed.    Plan of Care Reviewed:     Reason for appointment: Ruxience Infusion  Treatment number: 1 of 2 total treatments  [facility] Terms and Condition signed within one year?: Yes, today in [facility]    This RN reviewed s/s of allergic reaction with patient, all questions answered. Patient verbalized understanding.     Patient escorted to room. VS obtained. Orders reviewed, appropriate orders released.     IV access type: IV.   Labs drawn: No.  Pre-medications required prior to start of infusion: Yes, Tylenol, Benadryl and Solumedrol.   IV Ruxience administered per order.  Vital signs obtained at end of treatment and stable.      Patient tolerating infusion without issue. Patient stable upon leaving [facility] after treatment completion.      Last day of treatment?: no    If no, next appointment confirmed (date/time): TBD, 14 days from today        [place], [facility], Room 165  [phone]    Patient WAS wearing a surgical mask  Standard precautions were followed when caring for the patient.   PPE used by provider during encounter: Surgical mask and Gloves

## 2021-01-05 NOTE — Telephone Encounter (Signed)
Lab results to be scanned into EMR.    Windell Hummingbird, RN  Neurology

## 2021-01-18 ENCOUNTER — Telehealth: Payer: Self-pay

## 2021-01-18 NOTE — Telephone Encounter (Signed)
Left message    Patient identified by 2 identifiers: n/   You are scheduled in [facility] for  Inf.   Appointment date 7/5 @ time745  t".  **Delete if patient not coming for [place]**    New Patient: n     Patient to arrive on the first floor of the [facility], Room 165. Please plan extra time for any screening at the front hospital entrance.    If you will need a wheelchair, please request one at the security/information desk just inside the main entrance.   Have you been ill with any recent cough, fever above 100 F, SOB, HA, vomiting, diarrhea, abdominal pain? unable to assess  Are any family members currently sick with respiratory illness and/or fever? unable to assess  Have you traveled in the last 21 days OR been in contact with anyone who is sick and has travelled in the last 21 days? unable to assess  If you are ill with a cold or flu please call your PCP prior to your appointment.    [place] Rosana Hoes has updated what is considered "fully vaccinated" for visitors in the hospital.  Visitors must have received the booster to be considered "fully vaccinated". Of course, if visitors are not eligible for the booster yet, they will still be considered fully vaccinated until the booster is appropriate.  If the visitor does not have the required information, they will not be allowed to accompany you to your appointment.    Visitors who are not fully vaccinated will need to provide proof of a negative test taken within 24 hours prior to the visit (if an antigen test), 48 hours prior (if a PCR test), or 72 hours if they are a repeat visitor who has already provided one of these other tests at their prior visit.    Please contact [facility] [phone] if you have any questions or need to reschedule.   If you are going to arrive more than 15 minutes late, please call [facility].  For late arrivals, it may be necessary to reschedule your appointment for later in the day or for another day depending on what treatment  you are receiving.      If you arrive late and do not call, we may have to reschedule you once you arrive due to other scheduled appointments.      [facility]  [phone]  Sun-Sat/365 days a year     Comments:  Will the patient be needing transportation assistance to this appointment?    Lyft scheduled for   @

## 2021-01-19 ENCOUNTER — Ambulatory Visit
Admission: RE | Admit: 2021-01-19 | Discharge: 2021-01-19 | Disposition: A | Discharge status: Home / Self Care | Payer: Medicare Other | Source: Ambulatory Visit | Attending: Neurology | Admitting: Neurology

## 2021-01-19 ENCOUNTER — Ambulatory Visit: Payer: Medicare Other

## 2021-01-19 VITALS — BP 126/88 | HR 70 | Temp 98.3°F | Resp 18

## 2021-01-19 DIAGNOSIS — Z1159 Encounter for screening for other viral diseases: Secondary | ICD-10-CM | POA: Insufficient documentation

## 2021-01-19 DIAGNOSIS — G35 Multiple sclerosis: Secondary | ICD-10-CM | POA: Insufficient documentation

## 2021-01-19 LAB — COMPREHENSIVE METABOLIC PANEL
Alanine Transferase (ALT): 11 U/L (ref <=41)
Albumin: 4 g/dL (ref 4.0–4.9)
Alkaline Phosphatase (ALP): 80 U/L (ref 35–129)
Aspartate Transaminase (AST): 12 U/L (ref <=41)
Bilirubin Total: 0.2 mg/dL (ref <=1.2)
Calcium: 9.1 mg/dL (ref 8.6–10.0)
Carbon Dioxide Total: 24 mmol/L (ref 22–29)
Chloride: 104 mmol/L (ref 98–107)
Creatinine Serum: 0.56 mg/dL (ref 0.51–1.17)
E-GFR Creatinine (Male): >100 mL/min/{1.73_m2}
Glucose: 95 mg/dL (ref 74–109)
Potassium: 4.4 mmol/L (ref 3.4–5.1)
Protein: 6.9 g/dL (ref 6.6–8.7)
Sodium: 141 mmol/L (ref 136–145)
Urea Nitrogen, Blood (BUN): 12 mg/dL (ref 6–20)

## 2021-01-19 LAB — CBC WITH DIFFERENTIAL
Basophils % Auto: 0.4 %
Basophils Abs Auto: 0 10*3/uL (ref 0.0–0.2)
Eosinophils % Auto: 1.1 %
Eosinophils Abs Auto: 0.1 10*3/uL (ref 0.0–0.5)
Hematocrit: 36.1 % — ABNORMAL LOW (ref 41.0–53.0)
Hemoglobin: 12.3 g/dL — ABNORMAL LOW (ref 13.5–17.5)
Lymphocytes % Auto: 32.2 %
Lymphocytes Abs Auto: 1.9 10*3/uL (ref 1.0–4.8)
MCH: 29.7 pg (ref 27.0–33.0)
MCHC: 34 % (ref 32.0–36.0)
MCV: 87.4 fL (ref 80.0–100.0)
MPV: 8.2 fL (ref 6.8–10.0)
Monocytes % Auto: 9.8 %
Monocytes Abs Auto: 0.6 10*3/uL (ref 0.1–0.8)
Neutrophils % Auto: 56.5 %
Neutrophils Abs Auto: 3.3 10*3/uL (ref 1.8–7.7)
Platelet Count: 283 10*3/uL (ref 130–400)
RDW: 13.3 % (ref 0.0–14.7)
Red Blood Cell Count: 4.14 10*6/uL — ABNORMAL LOW (ref 4.50–5.90)
White Blood Cell Count: 5.9 10*3/uL (ref 4.5–11.0)

## 2021-01-19 LAB — HEPATITIS C AB SCREEN: Hepatitis C Ab Screen: NONREACTIVE

## 2021-01-19 LAB — LYMPH IMMUNOPHENTYPE PANEL
CD19+ ABSOLUTE COUNT: <1 /microL — ABNORMAL LOW (ref 79–574)
CD19+%: <1 % — ABNORMAL LOW (ref 4–25)
CD3%: 92 % — ABNORMAL HIGH (ref 58–87)
CD3+ ABSOLUTE COUNT: 1744 /micro/L (ref 668–2291)
CD3+CD4+ ABSOLUTE COUNT: 1243 /microL (ref 433–1692)
CD3+CD4+%: 65 % (ref 28–65)
CD3+CD8+ ABSOLUTE COUNT: 472 /microL (ref 147–1068)
CD3+CD8+%: 25 % (ref 12–40)
CD3- CD56+ ABSOLUTE COUNT: 130 /microL (ref 38–561)
CD3- CD56+%: 7 % (ref 2–26)
CD4/CD8 RATIO: 2.6 RATIO (ref 0.9–3.6)

## 2021-01-19 LAB — IMMUNOGLOBULIN M: IMMUNOGLOBULIN M: 51 mg/dL (ref 40–230)

## 2021-01-19 LAB — IMMUNOGLOBULIN G: IMMUNOGLOBULIN G: 1218 mg/dL (ref 700–1600)

## 2021-01-19 LAB — HEPATITIS B SURFACE ANTIBODY: HEPATITIS B SURFACE Ab: NONREACTIVE

## 2021-01-19 LAB — HEPATITIS B SURFACE ANTIGEN: Hepatitis B Surface Antigen: NONREACTIVE

## 2021-01-19 LAB — HEPATITIS B CORE AB,TOT,REFLEX: Hepatitis B Core Ab Total, Reflex: NONREACTIVE

## 2021-01-19 LAB — IMMUNOGLOBULIN A: IMMUNOGLOBULIN A: 284 mg/dL (ref 70–400)

## 2021-01-19 MED ORDER — METHYLPREDNISOLONE SODIUM SUCCINATE 125 MG SOLUTION FOR INJECTION
125.0000 mg | INTRAMUSCULAR | Status: DC | PRN
Start: 2021-01-19 — End: 2021-01-19

## 2021-01-19 MED ORDER — ALTEPLASE 2 MG INTRA-CATHETER SOLUTION
2.0000 mg | Status: DC | PRN
Start: 2021-01-19 — End: 2021-01-19

## 2021-01-19 MED ORDER — MEPERIDINE (PF) 50 MG/ML INJECTION SYRINGE
25.0000 mg | INJECTION | INTRAMUSCULAR | Status: DC | PRN
Start: 2021-01-19 — End: 2021-01-19

## 2021-01-19 MED ORDER — FAMOTIDINE (PF) 20 MG/2 ML INTRAVENOUS SOLUTION
20.0000 mg | INTRAVENOUS | Status: DC | PRN
Start: 2021-01-19 — End: 2021-01-19

## 2021-01-19 MED ORDER — NACL 0.9% IV INFUSION
INTRAVENOUS | Status: DC
Start: 2021-01-19 — End: 2021-01-19

## 2021-01-19 MED ORDER — HEPARIN, PORCINE (PF) 100 UNIT/ML INTRAVENOUS SYRINGE
3.0000 mL | INJECTION | INTRAVENOUS | Status: DC | PRN
Start: 2021-01-19 — End: 2021-01-19

## 2021-01-19 MED ORDER — DIPHENHYDRAMINE 50 MG/ML INJECTION SOLUTION
25.0000 mg | INTRAMUSCULAR | Status: DC | PRN
Start: 2021-01-19 — End: 2021-01-19

## 2021-01-19 MED ORDER — METHYLPREDNISOLONE SODIUM SUCCINATE 125 MG SOLUTION FOR INJECTION
100.0000 mg | Freq: Once | INTRAMUSCULAR | Status: AC
Start: 2021-01-19 — End: 2021-01-19
  Administered 2021-01-19: 100 mg via INTRAVENOUS
  Filled 2021-01-19: qty 2

## 2021-01-19 MED ORDER — ALBUTEROL SULFATE 2.5 MG/3 ML (0.083 %) SOLUTION FOR NEBULIZATION
2.5000 mg | INHALATION_SOLUTION | RESPIRATORY_TRACT | Status: DC | PRN
Start: 2021-01-19 — End: 2021-01-19

## 2021-01-19 MED ORDER — EPINEPHRINE 1 MG/ML (1 ML) INJECTION SOLUTION
0.3000 mg | INTRAMUSCULAR | Status: DC | PRN
Start: 2021-01-19 — End: 2021-01-19

## 2021-01-19 MED ORDER — LORAZEPAM 0.5 MG TABLET
0.5000 mg | ORAL_TABLET | ORAL | Status: DC | PRN
Start: 2021-01-19 — End: 2021-01-19

## 2021-01-19 MED ORDER — RITUXIMAB-PVVR 10 MG/ML INTRAVENOUS SOLUTION
1000.0000 mg | Freq: Once | INTRAVENOUS | Status: AC
Start: 2021-01-19 — End: 2021-01-19
  Administered 2021-01-19: 1000 mg via INTRAVENOUS
  Filled 2021-01-19: qty 100

## 2021-01-19 MED ORDER — DIPHENHYDRAMINE 25 MG CAPSULE
25.0000 mg | ORAL_CAPSULE | Freq: Once | ORAL | Status: AC
Start: 2021-01-19 — End: 2021-01-19
  Administered 2021-01-19: 25 mg via ORAL
  Filled 2021-01-19: qty 1

## 2021-01-19 MED ORDER — ACETAMINOPHEN 325 MG TABLET
650.0000 mg | ORAL_TABLET | Freq: Once | ORAL | Status: AC
Start: 2021-01-19 — End: 2021-01-19
  Administered 2021-01-19: 650 mg via ORAL
  Filled 2021-01-19: qty 2

## 2021-01-19 MED ORDER — ONDANSETRON HCL 8 MG TABLET
4.0000 mg | ORAL_TABLET | Freq: Three times a day (TID) | ORAL | Status: DC | PRN
Start: 2021-01-19 — End: 2021-01-19

## 2021-01-19 NOTE — Progress Notes (Signed)
Patient WAS wearing a surgical mask  standard precautions were followed when caring for the patient.   PPE used by provider during encounter: Surgical mask, Gown and Gloves     [facility] Progress Note:    Patient arriving to [facility] for scheduled appointment. Patient arriving by gurney, accompanied by transport. Two patient identifers used, name band placed.    Plan of Care Reviewed:     Reason for appointment: Ruxience  Treatment number: 2 of 2 total treatments  [facility] Terms and Condition signed within one year?: Yes    This RN reviewed s/s of allergic reaction with patient, all questions answered. Patient verbalized understanding.     Patient escorted to room. VS obtained. Orders reviewed, appropriate orders released.     IV access type: PIV placed, + blood return, flushing without difficulty.   Labs drawn: Yes.  Pre-medications required prior to start of infusion: Yes, 650 mg PO tylebol, 25 mg PO Benadryl, 100 mg IV solumedrol  650 mg Ruxience administered per order. Tolerated each rate increase to max rate of 400 mg/hr  Vital signs obtained at end of treatment and stable.      Patient tolerating infusion without issue. Patient stable upon leaving [facility] after treatment completion. Pt taken via gurney with transport back to SNF.      Last day of treatment?: yes     If no, next appointment confirmed (date/time): n/a        Gwinda Passe, RN  [facility]  [facility], [place]  [phone]

## 2021-01-20 ENCOUNTER — Ambulatory Visit: Payer: Medicare Other | Attending: Neurology | Admitting: Neurological Surgery

## 2021-01-20 ENCOUNTER — Other Ambulatory Visit: Payer: Self-pay | Admitting: Neurology

## 2021-01-20 ENCOUNTER — Ambulatory Visit: Payer: Medicare Other

## 2021-01-20 ENCOUNTER — Encounter: Payer: Self-pay | Admitting: Neurological Surgery

## 2021-01-20 VITALS — BP 108/70 | HR 56 | Resp 16

## 2021-01-20 DIAGNOSIS — G35 Multiple sclerosis: Secondary | ICD-10-CM

## 2021-01-20 DIAGNOSIS — G8389 Other specified paralytic syndromes: Secondary | ICD-10-CM | POA: Insufficient documentation

## 2021-01-20 NOTE — Progress Notes (Addendum)
[facility]   New Patient H&P/Initial Consultation Note    Dear Dr. Keenan Bachelor Dorsette,      Dear Dr. Franchot Erichsen,    Today in clinic I met Roy Henry. This clinic visit was in person. In summary of the clinic note to follow, the plan is: CT abdomen, and then re-evaluation of potential of Baclofen pump placement    Briefly, Roy Henry has severe MS and has significant spasticity in his lower extremities. He was sent to my clinic for evaluation of potentially inserting a Baclofen pump. The complicating issue in Roy Henry's case is that he previously had abdominal surgery and then developed a signficant hernia (Addam says, "5 hernias") which subsequently required placement of a significant mesh.    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 For this reason, I proposed to Dr. Franchot Erichsen that I meet Roy Henry prior to trying the Baclofen intrathecal injection.    At today's exam, I wasn't able to clearly palpate the edges of the mesh. So, I've asked to arrange for a CT abdomen to further delineate the extent of the mesh.    Assuming it's safe to insert a pump, I've gone over the risks and benefits of the operation, including how it could assist with his spasticity. We also talked about the risk of infection, under/over dosing on Baclofen, as well as the need for repeated pump revisions with the battery depletes. We also discussed Baclofen withdrawal as a potentially life-threatening consequence of being Baclofen dependant.    Roy Henry was keen on this plan. I will follow-up with him after his CT abdomen. If it's not in his best interest to get a pump, I will meet him in clinic again to discuss ablative / destructive surgeries for spasticity.    Thanks very much for involving me in his care. A total of 45 minutes was spent in  preparation, reviewing any relevant imaging, and meeting the patient.    -    Patient seen today in [facility] for initial consultation for evaluation for intrathecal Baclofen pump implantation versus ablative treatment of spasticity    HISTORY OF PRESENT ILLNESS:    Roy Henry is a 50yr old RHD male with history of multiple scelrosis with progressive spasticity despite Baclofen 80 mg and Valium. Past medical/surgical history significant for obstructive small bowel requiring exploratory laparotomy in November 2021 followed by hernia repair in March 2022. Patient reports that he was able to walk with FWW up to age 50 years and decline of function to bed-bound while he was not getting adequate PT/OT during his extensive abdominal surgery and during pandemic. He requires full time care due to severe flexion contractures of BLE and decreased mobility RUE. He presents for initial consultation I have reviewed the patient's medical history in detail and updated the computerized patient record.    He reports healed sacral decubitus ulcer since January 2022.       REVIEW OF SYSTEMS:   Complete review of systems, except the above in HPI, is negative.  negative for the following: fever, recent illness, palpitations, syncope, chest pain, paroxysmal nocturnal dyspnea, orthopnea, lower extremity edema, and  cough, shortness of breath, pleuritic pain, wheezing, dyspnea on exertion, vomiting, dysphagia, nausea, abdominal pain, dysuria, frequency, hesitancy, retention, incontinence, joint swelling, joint pain, joint redness, confusion,  headaches, seizures, depressed mood, anxiety, suicidal ideation, cold intolerance, heat intolerance, polydipsia, polyuria. All other systems are negative.    PAST MEDICAL/SURGICAL HISTORY:   Past Medical History:   Diagnosis Date    GERD (gastroesophageal reflux disease)     Hypertension     Immobility     bed/wheelchair bound    Multiple sclerosis ([place])      Past Surgical  History:   Procedure Laterality Date    EXPLORATORY LAPAROTOMY  02/09/2020    SB resection and appendectomy for SBO, complicated by sepsis, SMV thrombosis    EXPLORATORY LAPAROTOMY      SB resection 2nd time    REPAIR, HERNIA, INCISIONAL  09/21/2020    open Clarise Cruz repair with abdominal wall component separation, bilateral transversus abdominis release, anterior abdominal wall mesh and right inguinal hernia repair with mesh       PAST SOCIAL AND/OR FAMILY HISTORY:  The patient's past medical, social history, and family history were personally reviewed with the patient.    Family history:   No family history on file.      Social History     Socioeconomic History    Marital status: SINGLE     Spouse name: Not on file    Number of children: Not on file    Years of education: Not on file    Highest education level: Not on file   Occupational History    Not on file   Tobacco Use    Smoking status: Former Smoker    Smokeless tobacco: Never Used    Tobacco comment: stopped 14 yrs ago   Substance and Sexual Activity    Alcohol use: Not Currently    Drug use: Not Currently     Types: Marijuana     Comment: stopped 7 months ago    Sexual activity: Not on file   Other Topics Concern    Not on file   Social History Narrative    Not on file     Social Determinants of Health     Financial Resource Strain: Not on file   Food Insecurity: Not on file   Transportation Needs: Not on file   Physical Activity: Not on file   Stress: Not on file   Social Connections: Not on file   Intimate Partner Violence: Not on file   Housing Stability: Not on file         CURRENT MEDICATIONS:   Outpatient Medications Marked as Taking for the 01/20/21 encounter (Office Visit) with Darrelyn Hillock, MD   Medication Sig Dispense Refill    Acetaminophen (TYLENOL) 500 mg Tablet Take 2 tablets by mouth every 6 hours. 100 tablet 0    Amlodipine (NORVASC) 10 mg Tablet Take 10 mg by mouth every day.      Baclofen (LIORESAL) 10 mg  Tablet Take 2 tablets by mouth 4 times daily.      Dalfampridine (AMPYRA) 10 mg ER Tablet Take 1 tablet by mouth 2 times daily. Needs brand name, the generic didn't work for him. 60 tablet 11    Diazepam (VALIUM) 5 mg Tablet Take 1 tablet by mouth every 6 hours if needed (spasticity). 30 tablet     Diazepam (VALIUM) 5 mg Tablet Take 2 tablets by mouth every 6 hours. Take around the clock, not as needed. See separate rx for ADDITIONAL doses as needed. 60 tablet 5    Docusate (COLACE) 100 mg Capsule Take 1 capsule by mouth 2 times daily. [place]  capsule 0    Gabapentin (NEURONTIN) 100 mg Capsule Take 1 capsule by mouth 3 times daily. 90 capsule 0    Hydrocodone-Acetaminophen (NORCO) 5-325 mg Tablet Take 1 tablet by mouth.      Lidocaine (LIDODERM) 5 %(700 mg/patch) Patch Apply 1 patch to the skin every 24 hours. Apply to painful area (12 hours on, 12 hours off) 30 patch 0    Polyethylene Glycol 3350 (MIRALAX) 17 gram Powder Take 1 packet by mouth every morning. Mix in 4 to 8 oz of water, soda, coffee, juice or tea. 30 packet 0    RiTUXimab (RITUXAN) - Infusion Med Placeholder This is a placeholder order to keep the medication list accurate. See therapy plan for details.      Sennosides (SENOKOT) 8.6 mg Tablet Take 2 tablets by mouth every day at bedtime. 60 tablet 0    VITAMIN D PO Take 1,000 Units by mouth every morning.         ALLERGIES:    Allergies   Allergen Reactions    Tegretol [Carbamazepine] Unknown-Explain in Comments     unknown         PHYSICAL EXAMINATION:  General : BP 108/70 (SITE: right arm, Orthostatic Position: sitting, Cuff Size: regular)   Pulse 56   Resp 16   SpO2 97%    Constitutional:  Alert, NAD.  Pleasant and cooperative.  Comprehends and answers questions appropriately.  Psych: Oriented x4 Mood/Affect pleasant.   Eyes: Sclera anicteric.  ENT: Mucous membranes moist.  Neck supple.  Cardiovascular: Capillary refill < 3 seconds  Respiratory: Breathing non-labored,  non-tachypneic.  Skin: No evidence of rash or skin breakdown.  Extremities: Warm, good capillary refill. Positive pulses. No edema.   Rectal: Deferred.  Musculoskeletal/Neurological:   Mr. Kutch is awake, alert, pleasant and cooperative with exam. GCS =15 (E4 V5 M6). Pupils 3 mm round and briskly reactive to light bilaterally. Limited extraocular movement on left and with lateral nystagmus.  His face  is symmetric with intact sensation in all trigeminal dermatomes. His tongue, uvula and palate are midline.  Hearing intact to finger rubs. He is alone in the exam room. He is on a gurney. RUE splinted, minimal movement. LUE 4/5 with decreased fine motor strength.  BLE propped by pillows - significantly contracted. Able to wiggle toes.    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  Patient is also status post extensive abdominal surgeries (all healed incision) which may impact placement of Baclofen pump.  We would like to obtain abdominal CT to evaluate extent of mesh prior to decision to proceed with ITB. Option for ablative spasticity treatment will be considered if ITB is not feasible.  Plan: CT ABDOMEN + PELVIS WITHOUT CONTRAST        The above was discussed with the patient at today's visit.  We discussed natural history of the disease process and all questions were answered.   Dr.Brandman reviewed critical parts of the history, physical examination, radiological studies, and directed the care of the patient.    We spent at least 60 minutes new patient, with him today, the majority of the time was spent on counseling and discussion of our findings and his proposed treatments. Patient and family verbalized understanding of the above and are in agreement  of the plan.   All questions and concerns were addressed.         Denyse Dago, MSN, NP-C, [place]  Nurse Practitioner II  Neurological Surgery  [place] [facility] [place]  PAGER: [phone]

## 2021-01-20 NOTE — Nursing Note (Signed)
Identified patient with two patient identifiers, vitals were taken by emt and provided to me, and patient was placed into exam room

## 2021-01-21 NOTE — Addendum Note (Signed)
Addended byMilus Banister, Alyna Stensland on: 01/21/2021 02:31 PM     Modules accepted: Level of Service

## 2021-01-22 LAB — QUANTIFERON,TB GOLD PLUS
Mit - Nil: 5.41 IU/mL
Nil: 0.05 IU/mL
QFT (Qual): NEGATIVE
TB1 - Nil: 0 IU/mL
TB2 - Nil: -0.01 IU/mL

## 2021-02-02 ENCOUNTER — Encounter: Payer: Self-pay | Admitting: Family

## 2021-02-02 ENCOUNTER — Ambulatory Visit: Payer: Medicare Other | Attending: Family | Admitting: Family

## 2021-02-02 VITALS — BP 124/84 | HR 65 | Temp 98.7°F | Resp 16

## 2021-02-02 DIAGNOSIS — M24561 Contracture, right knee: Secondary | ICD-10-CM | POA: Insufficient documentation

## 2021-02-02 DIAGNOSIS — M24562 Contracture, left knee: Secondary | ICD-10-CM | POA: Insufficient documentation

## 2021-02-02 DIAGNOSIS — G35 Multiple sclerosis: Secondary | ICD-10-CM | POA: Insufficient documentation

## 2021-02-02 NOTE — Progress Notes (Signed)
[facility]  [address]  [place], [place]  [address]  Phone [phone] * Fax [phone]      PATIENT:  Roy Henry  MRN:         [id number]  DATE:        02/09/2021    History of Present Illness:   This is a 50 yr old male w/ a history of multiple sclerosis, who is on rituximab 1000 mg every 6 months for DMT, previously ordered by Dr. Blenda Peals at [facility] and administered [facility] in 07/07/2020.  He last received it at [facility] on 01/05/21 and 01/19/21. He tolerates rituximab well with no known side effects.  He was last seen by Dr. Lorenz Coaster on 11/05/2020 with the following plan:    #MS - advanced, previously off all DMTs for a few years and recently restarted rituximab in 06/2020.  - labs ordered today  - Continue rituixmab 1000 mg IV x 2 doses, two weeks apart,  at [facility] infusion  - Continue Ampyra 10 mg bid for weakness in the arms and legs    #Spasticity / contractures.   - continue diazepam 10 mg four times per day (every 6 hours) for spasticity  - additional 5 mg tab diazepam up to 4 times per day as needed for spasticity, to relax muscles for a shower / bath, transfers or during therapy.  - continue baclofen 20 mg four times per day   - call PM&R to schedule appointment to assess severe spasticity and consider baclofen pump: SNF to call [phone] for scheduling.    - Referral placed to Dr. Delories Heinz Botox clinic - able to extend the legs at the knees more on the right than the left side, limited by spasticity.   - continue passive ROM with PT and the CNAs at the SNF as well.     #SNF issues:  - wound care  - stretching, strengthening, contracture prevention  I will see the patient in followup in 3 months.    Interval History:  He arrived supine on a gurney accompanied by two transport EMTs who provided a medication list from the skilled nursing facility.  He states that he was not able to get Botox because it was denied by his insurance for spasticity.   He used to get Botox in the legs previously for 8 years that he states enabled him to walk with a walker.  He has been unable to walk since age 78.  He stopped getting the Botox during COVID because he had no transportation to get to [facility].  He was evaluated by Neurosurgery on 01/20/21 for a baclofen pump.  He has had 5 abdominal hernias so needs a CT scan of the abdomen prior to insertion of the baclofen pump.  Option for ablative spasticity treatment will be considered if ITB is not feasible.  He has been approved for assisted living and is looking forward to being able to being more independent.  He wants to be able to be in an electric wheelchair to be more mobile.  Currently, he has to lie down because of LE contractures.  He feels that he is about 25 % improved over the past several months because he can roll himself over in bed and his legs can passively be stretched out more than before.  He feels that his legs are less stiff after the rituximab infusion and he has a strong desire to not  give up and keep trying to get better.  His insurance denied physical therapy.  He will be unable to stay at the current facility past the end of this year.  He will be moving to assisted living and is uncertain where this would be.    Current MS symptoms:  He has some fatigue.  He uses caffeine/coffee for fatigue.  He denies cognitive problems.  He has good long term memory.  Denies vision problems other than reading glasses.  No speech or swallowing problems.  He has gained weight lately.  He has weakness of the right arm and both legs.  He has significant spasticity and contractures.  He is non-ambulatory.  He has urinary incontinence and has had past UTIs, none recent.  He has constipation and takes preventative medications as well as laxatives prn.  He     After the recent rituximab infusion in 07/07/20, he was getting better with more strength and movement in the right UE. Plan at last visit was that I was going to take  over the rituximab infusions. There was a question if there was sufficient B cell suppression after only one dose of 1000 mg. I recommended a second 1000 mg induction dose. But we needed to hold it for surgery.      He had a yeast infection in the groin recently that is now resolved.  No other infections reported.  He has been vaccinated with 3 COVID vaccines.    Since the last visit, he had a hernia repair surgery on 09/21/20. He can't do the PT like he was before surgery because of a concern about the hernia repair integrity.  He had mesh placement. He "needs the solar plexus to heal".    He thought he was here for steroids today. He continues to hope for some improvement so that he can be more independent. He would like to restart Ampyra. Prefers brand name because the generic did not work as well.     He has been working on the right UE doing his own PT and getting some movement now.     The legs are contracted and he hopes to get improvement in mobility (previously had benefit from the Botox at [facility]).     Pt is currently at the [facility] in [place]. Mr. Nelles recent hx is notable for multiple acute rehabilitation facilities since 01/2020 following surgery for necrotic bowel and SBO, then fall with a tendon rupture on his left leg.     Last MRI done in 2017. Might need large bore or open MRI due to leg contractures.     He had COVID in 07/2019 (Omicron), mild case.     MS History:  The onset of symptoms was when he was 35 years ago in 2007 with symptoms of lower extremity weakness, progressing to inability to bear weight. Mr. Province was previously on copaxone, tysabri and rituximab treatments in the past, all of which have worked well in the past, according to him. He also received multiple botox injections for his contractures before COVID, and that, according to patient, has helped with his symptoms as well. Ampyra was helpful in the past; however, was accidentaly stopped during his transfer from  one acute rehab to another as pt believes they lost some records during the transfer. He restarted rituximab by Dr. Blenda Peals in 06/2020, 1000 mg every 6 months. Prior to that infusion, the other infusion was over two years prior. Mr. Gulino said that because of  COVID-19 he had difficulty organizing transportation to get infusion at the center. When Mr. Dannemiller was on regular infusions, he reported to not have any relapses at that time.     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    Incisional hernia, without obstruction or gangrene 09/21/2020     Past Medical History:   Diagnosis Date    GERD (gastroesophageal reflux disease)     Hypertension     Immobility     bed/wheelchair bound    Multiple sclerosis (HCC)      Allergies:    Tegretol [Carbamazepine]    Unknown-Explain in Comments    Comment:unknown    Medications:     Current Outpatient Medications:     Acetaminophen (TYLENOL) 500 mg Tablet, Take 2 tablets by mouth every 6 hours., Disp: 100 tablet, Rfl: 0    Amlodipine (NORVASC) 10 mg Tablet, Take 10 mg by mouth every day., Disp: , Rfl:     Aspirin 81 mg EC Tablet, Take 1 tablet by mouth every morning., Disp: , Rfl:     Baclofen (LIORESAL) 10 mg Tablet, Take 2 tablets by mouth 4 times daily., Disp: , Rfl:     Dalfampridine (AMPYRA) 10 mg ER Tablet, Take 1 tablet by mouth 2 times daily. Needs brand name, the generic didn't work for him., Disp: 60 tablet, Rfl: 11    Diazepam (VALIUM) 5 mg Tablet, Take 1 tablet by mouth every 6 hours if needed (spasticity)., Disp: 30 tablet, Rfl:     Diazepam (VALIUM) 5 mg Tablet, Take 2 tablets by mouth every 6 hours. Take around the clock, not as needed. See separate rx for ADDITIONAL doses as needed., Disp: 60 tablet, Rfl: 5    Docusate (COLACE) 100 mg Capsule, Take 1 capsule by mouth 2 times daily., Disp: 60 capsule,  Rfl: 0    Gabapentin (NEURONTIN) 100 mg Capsule, Take 1 capsule by mouth 3 times daily., Disp: 90 capsule, Rfl: 0    Hydrocodone-Acetaminophen (NORCO) 5-325 mg Tablet, Take 1 tablet by mouth., Disp: , Rfl:     Lidocaine (LIDODERM) 5 %(700 mg/patch) Patch, Apply 1 patch to the skin every 24 hours. Apply to painful area (12 hours on, 12 hours off), Disp: 30 patch, Rfl: 0    Magnesium Hydroxide (MILK OF MAGNESIA) 400 mg/5 mL Liquid, Take 30 mL by mouth once daily if needed., Disp: 900 mL, Rfl: 0    Naloxone (NARCAN) 4 mg/actuation Nasal Spray, Use for opioid overdose. Give 1 spray in nostril. If no response / breathing slows down, repeat every 2 minutes until emergency help arrives., Disp: 2 each, Rfl: 0    onabotulinumtoxinA (BOTOX INJ), 0 Refill(s), Maintenance, Disp: , Rfl:     Polyethylene Glycol 3350 (MIRALAX) 17 gram Powder, Take 1 packet by mouth every morning. Mix in 4 to 8 oz of water, soda, coffee, juice or tea., Disp: 30 packet, Rfl: 0    RiTUXimab (RITUXAN) - Infusion Med Placeholder, This is a placeholder order to keep the medication list accurate. See therapy plan for details., Disp: , Rfl:     Sennosides (SENOKOT) 8.6 mg Tablet, Take 2 tablets by mouth every day at bedtime., Disp: 60 tablet, Rfl: 0    VITAMIN D PO, Take 1,000 Units by mouth every morning., Disp: , Rfl:     Social history:  He lives in the acute rehab center currently but will have to be transferred to assisted living at the beginning of next year.  He reports smoking in the past, but stopped at the age of 27. No current alcohol or other recreational drug use.   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    Family history:   No family history of multiple sclerosis in family members. Young sister diagnosed with lupus at the age of 27.     Examination:  Temp: 37.1 C (98.7 F) (07/19 1401)  Temp src: Temporal (07/19 1401)  Pulse: 65 (07/19 1401)  BP: 124/84 (07/19 1401)  Resp: 16 (07/19 1401)  SpO2: --  Height:  --  Weight: --       EXAMINATION:    General Physical Exam:  General:  Well-developed male in NAD  HEENT:  Normocephalic, normal hair distribution, sclerae white, oropharynx pink with no exudate, no lymphadenopathy.  Poor dental hygiene.  Cardiovascular:  Regular rate and rhythm, S1S2, no murmur, no peripheral edema  Respiratory:  Clear to auscultation, no wheezes  Abdominal:  Soft, non-tender  Rectal / GU:  Not performed  Extremities:  Severe contractions LE bilaterally.  Severe weakness UE bilaterally L>R  Skin:  Pink, warm and dry    Neuro Exam:    Mental Status: Alert and oriented  State: Calm  MMSE:  Not performed  Use of language:  Fluent, normal    Cranial Nerves:  CN 2:        Pupils PERRL.    CN 3,4,6:  EOM intact with mild nystagmus  CN 5:        Sensation to LT intact.  Masseter strength intact  CN 7:        Facial symmetry intact    CN 8:        Hearing intact  CN 9, 10:  Palate elevation intact  CN 11:      Trapezius strength intact  CN 12:      Tongue protrusion midline    Motor:  Tone and bulk: severe spasticity bilateral lower extremities with contractures.    Adventitious movements:  none         Deltoid Biceps Triceps BR Wrist Flexors Wrist Extensors Finger Abductors Grip   R 3 1 0 0 0 0 0 0   L 4 4 4 4 4 4 4 4       Hip Flexors Hip Ext Leg Abduct Leg Adduct Knee Flexors Knee Ext Dorsi- Flexion Plantar- Flexion   R 0 0 0 0 0 0 0 0   L 0 0 0 0 0 0 0 0   Spasms of both LEs on examination.    Reflexes:     Biceps-C5 BR-C6 Triceps-C7 Patellar-L4 Achilles-S1 Plantar Resp   R 1 1 - 0 0 NT   L 1 1 - 0 0 NT     Sensory:  Light touch:  intact  Vibration:  Mildly reduced fingertip bilaterally.  Absent in LE at knees bilaterally  Temperature:  intact  Romberg Sign:  Unable to stand    Coordination:  F-to-N: slow but intact on the right.  Unable on left due to weakness  H-to-S: unable to perform   Rapid-alt movements:  Unable to perform    General gait:  Non ambulatory  Tandem gait:  Non ambulatory    CN 3,4,6:   EOM full, horizontal nystagmus noted.  CN 5:  Sensation to LT intact bilaterally.    CN 7:        Facial symmetry normal.  CN 8:        Hearing intact to finger rub bilaterally.  CN 9, 10:  Palate elevation midline.  CN 11:      Trapezius strength 5/5 bilaterally.  CN 12:      Tongue protrusion midline.    Labs/Imaging:   Normal CBC and CMP 09/29/20 except mild anemia.     [facility] labs from 03/2016:  Hep B sAb negative  Hep B sAg negative  Hep B cAb negative  Hep C negative  HIV negative    Labs done at [facility] on 01/19/21:  Hepatitis B and C serologies normal  Quantiferon gold TB neg  IgG and IgM normal  CMP normal  CD19 <1  CBC  unremarkable    Assessment / Plan:  In summary, THADDEAUS MONICA III is a 50yr old male with a previous diagnosis of multiple sclerosis, advanced, now on rituximab. Severe LE contractures, spasticity, pain. RUE weakness that has improved recently since restarting rituximab, possible early contractures at elbow and shoulder. LUE with weakness that is worse since surgery. His contractures are severe, painful and prevent mobility because he is limited to laying down on a gurney.       #MS   - advanced, continue rituximab 1000 mg x 2 doses every 6 months, due next in 06/2021 at [facility]-orders are current.  - Continue Ampyra 10 mg bid for weakness in the arms and legs  - recommend a second COVID booster since he is immunosuppressed    #Spasticity / contractures.   - recommend increasing diazepam 10 mg four times per day (every 6 hours) for spasticity  - continue 5 mg tab diazepam up to 4 times per day as needed for spasticity, to relax muscles for a shower / bath, transfers or during therapy.  - continue baclofen 20 mg four times per day   - Referred to Dr. Roosevelt Locks at [facility] for Botox injections due to severe spasticity of LEs.    - continue passive ROM with PT and the CNAs at the SNF as well.     #SNF issues:  - wound care  - stretching, strengthening, contracture  prevention    Follow-up in 3 months with Dr. Lorenz Coaster.    Total time I spent in care of this patient today (excluding time spent on other billable services) was 60 minutes.      Sincerely,    Burnadette Peter, MSN, FNP-C  Neurology

## 2021-02-02 NOTE — Nursing Note (Signed)
.  Vital signs taken, screened for pain, pharmacy verified, allergies reviewed, tobacco questions asked.  [name] MA II

## 2021-02-02 NOTE — Patient Instructions (Addendum)
Thank you for visiting the Clinical Neurosciences Department at the [facility] today.    The neurologist(s) you saw today was(were):   Nurse Practitioner: Burnadette Peter NP     Recommendations made during today's visit include:   Call us for new or worsening symptoms.   Follow-up with Dr. Lorenz Coaster in 3 months.    The following diagnostic tests have been ordered to help [place] better determine the underlying cause of your condition as well as guide best treatment. :        The following changes to your medication regimen have been made:    Recommend a second COVID booster at least 3 months after the last one and 3 months after the dose of rituximab (October)    The following referrals have been generated through the [facility] system:   Dr. Roosevelt Locks for evaluation for Botox injections to lower extremities.      An open line of communication between you, your family, and [place] (your neurology team) is important to [place], both for safety and maintaining the highest quality of patient care.  --------------------------------------------------------------------------------------------------------    If you have any questions, please feel free to ask [place]!    1. By telephone.  You can reach your resident neurologist by calling the [facility] neurology clinic at [phone].    2. Online. We also encourage you to register for [employer]," which allows you online computer access to your medical chart, as well as an opportunity to e-mail your doctor regarding non-urgent issues.  You can sign up by visiting the following link: "[email or web address]".    Thank you for choosing the Neurology department at the [facility].  It was a pleasure to participate in your care today    Sincerely,     Burnadette Peter, NP-BC

## 2021-02-09 ENCOUNTER — Other Ambulatory Visit: Payer: Self-pay | Admitting: Gastroenterology

## 2021-02-09 ENCOUNTER — Telehealth: Payer: Self-pay

## 2021-02-09 NOTE — Telephone Encounter (Signed)
Called pt verified with 2 pt identifiers     Confirmed procedure date, time and location, prep instructions thoroughly reviewed with patient.     Patient aware they will need to arrange for a driver that can accept responsibility in assisting them home and to remain with them post procedure if under moderate sedation and overnight if under General Anesthesia.     Patient  is aware that if pt is utilizing their health insurance to setup your transportation, a responsible adult must escort pt home as well."      Patient aware they will need to arrange for a driver that can accept responsibility in assisting them home and to remain with them post procedure and overnight if under General Anesthesia    Patient  can expect to be here about 2-3 hours. Pt agreed to review provided GI instructions that were previously sent and follow the procedure prep carefully. If taking any prescription blood thinners they will need to consult with prescribing physician, provided our recommendation per type of blood thinner pt is taking, pt is aware to hold all NSAIDS (ibuprofen, Aleve, Advil, Motrin).     Patient  verbalized understanding that patient is required to do a mandatory COVID screening prior to their procedure if they are not fully Covid Vaccinated or have a general anesthesia case.   As well as guest must provide proof of copleted Covid Vaccination or negative covid test results within 72 hours of appointment.    Patient provided GI lab contact telephone [phone] and will call back with any further questions or concerns.    Pt verbalized understanding of instructions.       Pt is scheduled for Covid 19 Testing with [facility].    Faxed over instructions to   [facility]   [phone]; Pt cell-[phone]      Marden Noble, LVN  GI Lab

## 2021-02-10 ENCOUNTER — Ambulatory Visit
Admission: RE | Admit: 2021-02-10 | Discharge: 2021-02-10 | Disposition: A | Discharge status: Home / Self Care | Payer: Medicare Other | Source: Ambulatory Visit | Attending: Body Imaging | Admitting: Body Imaging

## 2021-02-10 DIAGNOSIS — G8389 Other specified paralytic syndromes: Secondary | ICD-10-CM | POA: Insufficient documentation

## 2021-02-10 DIAGNOSIS — G35 Multiple sclerosis: Secondary | ICD-10-CM | POA: Insufficient documentation

## 2021-02-13 NOTE — Progress Notes (Signed)
The pt presented for a colonoscopy. The pt said that he was not clean and was still passing formed stools. There was soft brown stools noted in the diaper.   The pt was given the option of an attempt at colonoscopy today vs rescheduling it after a better prep. The pt says that PEG soln ( golytely) worked well for him in the past. He took suprep this time and he says that it did not work for him.   He wants to try golytely and reschedule the procedure.   Discussed with the scheduler and the procedure was canceled for now and he will be rescheduled.

## 2021-02-15 ENCOUNTER — Ambulatory Visit: Payer: Medicare Other | Attending: Gastroenterology

## 2021-02-15 DIAGNOSIS — Z1152 Encounter for screening for COVID-19: Secondary | ICD-10-CM | POA: Insufficient documentation

## 2021-02-15 DIAGNOSIS — Z20822 Contact with and (suspected) exposure to covid-19: Secondary | ICD-10-CM

## 2021-02-15 LAB — COVID-2019 RNA, QUAL: SARS-CoV-2: NOT DETECTED

## 2021-02-15 NOTE — Nursing Note (Signed)
The following procedure(s) were done per physician's order: Patient was swabbed for COVID testing.      Patient WAS wearing a surgical mask  Contact, Droplet and Airborne precautions were followed when caring for the patient.   PPE used by provider during encounter: Surgical mask, N95 mask, Face Shield/Goggles, Gown and Gloves     Swabbed both nostrils. Swabbed by Khadija Chepkurui, LVN  Patient tolerated well.     Tammera Engert, MA ll  Ambulatory [facility]

## 2021-02-17 ENCOUNTER — Ambulatory Visit
Admission: RE | Admit: 2021-02-17 | Discharge: 2021-02-17 | Disposition: A | Discharge status: Home / Self Care | Payer: Medicare Other | Source: Ambulatory Visit | Attending: Surgery | Admitting: Surgery

## 2021-02-17 ENCOUNTER — Encounter: Payer: Self-pay | Admitting: Anesthesiology

## 2021-02-17 DIAGNOSIS — Z1211 Encounter for screening for malignant neoplasm of colon: Secondary | ICD-10-CM

## 2021-02-17 MED ORDER — SIMETHICONE 40 MG/0.6 ML ORAL DROPS,SUSPENSION
40.0000 mg | Freq: Once | ORAL | Status: DC
Start: 2021-02-17 — End: 2021-02-23

## 2021-02-17 MED ORDER — ONDANSETRON HCL (PF) 4 MG/2 ML INJECTION SOLUTION
4.0000 mg | INTRAMUSCULAR | Status: DC | PRN
Start: 2021-02-17 — End: 2021-02-23

## 2021-02-17 MED ORDER — PROPOFOL 10 MG/ML INTRAVENOUS EMULSION
INTRAVENOUS | Status: AC
Start: 2021-02-17 — End: 2021-02-17
  Filled 2021-02-17: qty 100

## 2021-02-17 MED ORDER — EPINEPHRINE 0.1 MG/ML INJECTION SYRINGE
1.0000 mg | INJECTION | INTRAMUSCULAR | Status: DC | PRN
Start: 2021-02-17 — End: 2021-02-23

## 2021-02-17 MED ORDER — LACTATED RINGERS IV INFUSION
INTRAVENOUS | Status: DC
Start: 2021-02-17 — End: 2021-02-23

## 2021-02-17 MED ORDER — ATROPINE 0.1 MG/ML INJECTION SYRINGE
0.5000 mg | INJECTION | INTRAMUSCULAR | Status: DC | PRN
Start: 2021-02-17 — End: 2021-02-23

## 2021-02-17 MED ORDER — EPINEPHRINE 0.1 MG/ML INJECTION SYRINGE
0.3000 mg | INJECTION | INTRAMUSCULAR | Status: DC | PRN
Start: 2021-02-17 — End: 2021-02-23

## 2021-02-17 MED ORDER — NALOXONE 0.4 MG/ML INJECTION SOLUTION: 0.4000 mg | INTRAMUSCULAR | Status: DC | PRN

## 2021-02-17 MED ORDER — FLUMAZENIL 0.1 MG/ML INTRAVENOUS SOLUTION
0.2000 mg | INTRAVENOUS | Status: DC | PRN
Start: 2021-02-17 — End: 2021-02-23

## 2021-02-18 ENCOUNTER — Other Ambulatory Visit: Payer: Self-pay | Admitting: GASTROENTEROLOGY

## 2021-02-18 DIAGNOSIS — Z1211 Encounter for screening for malignant neoplasm of colon: Secondary | ICD-10-CM

## 2021-02-18 NOTE — Telephone Encounter (Signed)
Called Zack at [facility]   And left message regarding pt upcoming GI procedure. Requested that Zack call back to confirm appt and go over pre procedure instructions.  Left GI Lab Nurse Line #    Also mentioned that I would be faxing over instructions to fax # [phone]. And would need to know which pharmacy to send the bowel prep order to.       Marden Noble, LVN  GI Lab

## 2021-03-05 ENCOUNTER — Other Ambulatory Visit: Payer: Self-pay | Admitting: Rehabilitative and Restorative Service Providers"

## 2021-03-05 DIAGNOSIS — G8389 Other specified paralytic syndromes: Secondary | ICD-10-CM

## 2021-03-05 DIAGNOSIS — G35 Multiple sclerosis: Secondary | ICD-10-CM

## 2021-03-09 ENCOUNTER — Telehealth: Payer: Self-pay

## 2021-03-09 ENCOUNTER — Other Ambulatory Visit: Payer: Self-pay | Admitting: PMR

## 2021-03-09 ENCOUNTER — Other Ambulatory Visit: Payer: Self-pay | Admitting: Rehabilitative and Restorative Service Providers"

## 2021-03-09 DIAGNOSIS — D699 Hemorrhagic condition, unspecified: Secondary | ICD-10-CM

## 2021-03-09 DIAGNOSIS — G35 Multiple sclerosis: Secondary | ICD-10-CM

## 2021-03-09 DIAGNOSIS — R252 Cramp and spasm: Secondary | ICD-10-CM

## 2021-03-09 NOTE — Telephone Encounter (Signed)
Today I faxed lab orders to facility where this pt lives.    [facility]   Attn: [facility] (outpatient coordinator)  Fax # [phone]  Phone # [phone]    Also spoke with Juliette Mangle regarding labs needed and she will make sure that they are drawn by 03/14/21.  Thedore Mins will be in charge of transportation.  Times / date given to [facility].      Levada Dy RN

## 2021-03-16 NOTE — Nurse Focus (Signed)
Spoke to [employer] stage manager at [facility] and also spoke with Roy Henry.  (Note: faxed this information also to facility at [phone] ).                                         Welcome to Interventional Radiology!    You have an appointment on Thursday, 03/18/21  for a Lumbar puncture with IT Baclofen injection  procedure. Plan to be here 6-7 hours.    Here are your instructions:    Arrive at 7:30am  to room [facility]. This is on the first floor, main hospital at [facility]. [place]. Maps available at front desk.     Diet: 2-3 hours fasting (NO solid foods or dairy products)  recommended prior to this procedure, but encourage good hydration day before and the morning of the procedure.     MEDICATIONS : You may take your home medications as usual/ with some exceptions as follows:    HOLD: BACLOFEN dose the morning of procedure.  HOLD: VALIUM dose the morning of procedure.   *These medications will interfere with the procedure and the evaluation of the effectiveness for the IT baclofen injected.    CONTINUE pain medications as usual, making sure to take the morning does before coming to procedure.   CONTINUE taking your blood thinner ASPIRIN '81mg'$ .          If you are on anticoagulants (medicines that thin your blood) or any other medicines that may not be in your medical record, please call number below.     It is okay to take medicines with your clear liquids. If a medication is needed within 2 hours of your appointment, take it with a small sip of water.       Lab work: your lab work is sufficient.  Faxed labs were received.    You must have a driver to take you home after the procedure.  Thedore Mins is coordinating your transportation).     Visitors must be vaccinated AND boosted against Covid 19 AND provide proof of vaccination upon entering hospital.  Starting this Friday, February 18, test policies for visitors   at [facility]  will be changing.  Visitors who are not fully vaccinated will need to provide proof of a negative test taken within 24 hours prior to the visit (if an antigen test), 48 hours prior (if a PCR test), or 72 hours if they are a repeat visitor who has already provided one of these other tests at their prior visit.  If your driver does not have this, they can drop you off and pick you up after the procedure. You are only allowed to have 1 person accompany you to your appointment.     You must consistently wear a mask while in the hospital zone. Visitors will not be allowed if they have signs and symptoms of illness, including cough, runny nose, sneezing, fever or sore throat, or have been around someone with COVID- 19. Children less than 68 years old will not be allowed in the hospital premises per hospital policy, no exceptions. They will need to wait for you in the cafeteria or in the car while you are having your procedure.    To speak to someone about your upcoming procedure call [phone] option #3 or call [phone]  Thank Michail Jewels RN  Neuro IR Coordinator    Active my chart avail  [place] pox s/s screening:  denies any.

## 2021-03-17 ENCOUNTER — Telehealth: Payer: Self-pay | Admitting: PMR

## 2021-03-17 MED ORDER — BACLOFEN 50 MCG/ML (1 ML) INTRATHECAL SYRINGE
50.0000 ug | INJECTION | Freq: Once | INTRATHECAL | Status: DC
Start: 2021-03-18 — End: 2021-03-18
  Filled 2021-03-17: qty 1

## 2021-03-18 ENCOUNTER — Encounter: Payer: Self-pay | Admitting: Diagnostic Radiology

## 2021-03-18 ENCOUNTER — Ambulatory Visit
Admission: RE | Admit: 2021-03-18 | Discharge: 2021-03-18 | Disposition: A | Discharge status: Home / Self Care | Payer: Medicare Other | Source: Ambulatory Visit | Attending: Diagnostic Radiology | Admitting: Diagnostic Radiology

## 2021-03-18 DIAGNOSIS — G8389 Other specified paralytic syndromes: Secondary | ICD-10-CM | POA: Insufficient documentation

## 2021-03-18 DIAGNOSIS — R252 Cramp and spasm: Secondary | ICD-10-CM | POA: Insufficient documentation

## 2021-03-18 DIAGNOSIS — G35 Multiple sclerosis: Secondary | ICD-10-CM | POA: Insufficient documentation

## 2021-03-18 DIAGNOSIS — Z1152 Encounter for screening for COVID-19: Secondary | ICD-10-CM | POA: Insufficient documentation

## 2021-03-18 HISTORY — PX: IR LUMBAR PUNCTURE: RADIR01185

## 2021-03-18 MED ORDER — NACL 0.9% IV INFUSION
INTRAVENOUS | Status: DC
Start: 2021-03-18 — End: 2021-03-22

## 2021-03-18 MED ORDER — HYDROCODONE 5 MG-ACETAMINOPHEN 325 MG TABLET
1.0000 | ORAL_TABLET | Freq: Once | ORAL | Status: AC
  Administered 2021-03-18: 1 via ORAL
  Filled 2021-03-18: qty 1

## 2021-03-18 MED ORDER — BACLOFEN 50 MCG/ML (1 ML) INTRATHECAL SYRINGE
50.0000 ug | INJECTION | Freq: Once | INTRATHECAL | Status: AC
Start: 2021-03-18 — End: 2021-03-18
  Administered 2021-03-18: 50 ug via INTRATHECAL
  Filled 2021-03-18: qty 1

## 2021-03-18 MED ORDER — DIPHENHYDRAMINE 50 MG/ML INJECTION SOLUTION
INTRAMUSCULAR | Status: AC
Start: 2021-03-18 — End: 2021-03-18
  Filled 2021-03-18: qty 1

## 2021-03-18 MED ORDER — HYDROCODONE 5 MG-ACETAMINOPHEN 325 MG TABLET
1.0000 | ORAL_TABLET | ORAL | Status: DC | PRN
  Administered 2021-03-18: 1 via ORAL
  Filled 2021-03-18: qty 1

## 2021-03-18 MED ORDER — DIPHENHYDRAMINE 50 MG/ML INJECTION SOLUTION
50.0000 mg | Freq: Once | INTRAMUSCULAR | Status: AC
Start: 2021-03-18 — End: 2021-03-18
  Administered 2021-03-18: 50 mg via INTRAVENOUS

## 2021-03-18 NOTE — Nurse Procedure (Signed)
POST PROCEDURE INSTRUCTIONS FOR: Lumbar Puncture with IT [place]    Today you have had a Lumbar Puncture or Myelogram.  Results of this exam will be discussed in a follow-up appointment with your physician.  If you do not have a follow-up appointment, please contact your physicians' office to schedule one.    Although side effects from this procedure are not common, you may notice the following:  A headache or nausea for 1-2 days after the procedure.  This is normal, and the headaches can usually be treated with Tylenol (acetaminophen).  It is important to rest and drink lots of fluids should you develop a headache.  Additionally, caffeinated drinks can be useful in preventing headaches or treating them once they occur.  Consult your physician if you require medications to treat nausea.  Dizziness or discomfort or weakness in the legs or arms. These sensations as well should only last for 1-2 days.  Should they persist beyond 2 days, notify your physician or the Neuro-radiologist.  See below for a complete list of symptoms that should be reported.    Diet  You may resume your normal diet.  If you are not on any fluid restrictions, you should force fluids for the next 24 hours.  Do not drink alcohol for the next 24 hours.    Activity  You should rest for the next 12 hours.  Avoid strenuous activity or lifting of objects greater than 25 pounds for the next 24 hours.  You may resume your regular activities after 24 hours.  Sitting up in a 30-45 degree angle for the next 4-6 hours will help to clear the contrast dye from your body and help prevent a headache from developing.    Medications  Unless directed otherwise, resume your prescribed medications.  If you have concerns about resuming certain medications, please ask your physician or the Neuro-radiologist.    Comments: Side Effects  Nausea and vomiting  Drowsiness/sedation  Pain  Hypotension  Vasovagal response  Seizures paresthesias  Urinary  retention  Dizziness  Respiratory depression  coma      Procedure Site Care  Adhesive bandages have been applied to the puncture site(s).  They may have small amounts of clear or light pink drainage for 1-2 days.  This is normal.  Remove the bandage(s) tomorrow.  You may apply loose gauze dressings or band-aid(s) over the puncture site(s) to prevent soiling of your clothing.  No other special care is required at the puncture site(s) and you may shower after 24 hours.    You should call your physician or the Neuro-radiologist immediately if any of the following occur:  Chills or temperature of 101 degree F or greater  Development of a severe headache not treatable with pain medication  Development of nausea, stiff neck, seizures, loss of bladder function  Increased confusion or irritability, difficulty concentrating or staying alert  Headache, arm/leg pain or weakness that lasts beyond 1-2 days  Questions about the procedure or symptoms you are experiencing    The Neuro-radiologist can be contacted by calling [phone] during business hours and [phone] after hours and ask the operator for the Neuro-radiologist on-call.

## 2021-03-18 NOTE — H&P (Signed)
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 He is under the care of Dr. DFranchot Erichsen who referred him for ITB trial on 12/16/20 after patient's tone has been refractory to medical management with PO baclofen 20 mg QID, Valium 10 mg Q6h, and tizanidine 6 mg TID. He has not tried oral dantrolene. Dr. BMilus Banisterhas evaluated the patient clinically and with abdominal CT, and thinks that he would be an appropriate surgical candidate for ITB pump placement, if intrathecal baclofen trial warrants it.    S: reports he is hopeful that a pump will afford him the ability to return to ambulation and independent living. Reports tone is bothersome for peri-care and has frequent yeast infection in intertrignous regions of groin.     Baseline Functional Status:   Function:  Mobility  dependent for bed mobility and transfers   ADLs  dependent   Thx  currently not receiving therapies   DME/Orthotics  at a skilled nursing facility.  Today he arrives on a gurney        Past Medical / Surgical History:    Past Medical History:   Diagnosis Date    GERD (gastroesophageal reflux disease)     Hypertension     Immobility     bed/wheelchair bound    [place]      Past Surgical History:   Procedure Laterality Date    EXPLORATORY LAPAROTOMY  02/09/2020    SB resection and appendectomy for SBO, complicated by sepsis, SMV thrombosis    EXPLORATORY LAPAROTOMY      SB resection 2nd time    REPAIR, HERNIA, INCISIONAL  09/21/2020    open RClarise Cruzrepair with abdominal wall component separation, bilateral transversus abdominis release, anterior abdominal wall mesh and right inguinal hernia repair with mesh       Allergies:        Tegretol [Carbamazepine]    Hives           Family History: denies fam hx of nerve, bone, muscle disease    No family history on file.      Social History: residing in SNF    Social History     Socioeconomic History    Marital status: SINGLE   Tobacco Use    Smoking status: Former    Smokeless tobacco: Never    Tobacco comments:     stopped 14 yrs ago   Substance and Sexual Activity    Alcohol use: Not Currently    Drug use: Not Currently     Types: Marijuana     Comment: stopped 7 months ago     I did review the patient's past medical, family, and social history, no changes noted    ROS:   Constitutional: no fevers or chills  Eyes:  no visual changes  HENT: no dysphagia  Cardiovascular:  no chest pain, no DOE  Respiratory:  no cough, no sputum production  Gastrointestinal:  no diarrhea, no constipation  Genitourinary: no dysuria  MSK:  no joint pain   Integument:  no rash or bruising  Neurologic: as in HPI  Psychiatric: mood ok    ================================================  OBJECTIVE:   Vital signs- Temp: 36.5 C (  97.7 F) (09/01 0722)  Temp src: Temporal (09/01 0722)  Pulse: 55 (09/01 0722)  BP: 126/87 (09/01 0722)  Resp: 12 (09/01 0722)  SpO2: 95 % (09/01 0722)  Height: 185.4 cm ('6\' 1"'$ ) (09/01 0722)  Weight: 81.6 kg (180 lb) (09/01 [id number])   Physical Exam  Constitution:  alert, pleasant, cooperative with exam, NAD.  PSYCH:  normal affect  Eyes:  Sclera anicteric, PERRL  Ears/nose/mouth/throat:  mucous membranes moist, clear oropharynx  CV:  Normal capillary refill in the bilateral lower extremities.  Respiratory:  nonlabored on RA  GI:  Soft, ND, NTTP.   EXT:  No cyanosis or edema in the bilateral lower extremities.  SKIN:  no rashes noted.  MSK:  On inspection: atrophy of hand instrinsics, BLE  On ROM: R EE contracture, limited to 90 of EF. Internal rotation/adduction of b/l hips, KF contractures limited to 90 deg of KE b/l  NEURO:   MAS 0 RUE, 1+ LUE EF, 1 pronator otherwise 0 LUE  BLE MAS 3-4 Hadd,  HIR, KF      DIET: NPO until 3-4 hours following lumbar puncture and intrathecal baclofen injection    RECENT PERTINENT LAB/MICROBIO STUDIES:  Reviewed, see EMR for actual results. Labs reviewed include:   Lab Results   Lab Name Value Date/Time    WBC 5.9 01/19/2021 08:59 AM    HGB 12.3 (L) 01/19/2021 08:59 AM    HCT 36.1 (L) 01/19/2021 08:59 AM    PLT 283 01/19/2021 08:59 AM     BASIC METABOLIC PANEL No results for input(s): GLU, BUN, CR, NA, K, CL, CO2, [place] in the last 24 hours.       RECENT PERTINTENT IMAGING STUDIES:  CT A/P 7/27: Lower Chest: Mild right basilar atelectasis. No pleural or pericardial  effusion.  Liver: Unremarkable.  Bile Ducts: Unremarkable.  Gallbladder: Unremarkable.  Pancreas: Unremarkable.  Spleen: Unremarkable.  Adrenal Glands: Calcified right adrenal gland.  Kidneys: Nonobstructing 2 mm calculus involving the superior pole of the  right kidney. No hydronephrosis.  GI Tract: Unremarkable.  Peritoneal Cavity: No free fluid or free air.  Bladder: Unremarkable.  Prostate and Seminal Vesicles: Unremarkable.  Lymph Nodes: No lymphadenopathy.  Major Vascular Structures: Mild atherosclerotic calcifications involve the  aorta and branch vessels  Soft Tissues: Diastases of the rectus abdominis muscles measuring 3.7 cm.  Midline anterior abdominal surgical scar.  Musculoskeletal: Pseudoarthrosis of the left hip. No aggressive appearing  osseous lesions. Mild superior endplate depression at L4     I have independently visually reviewed the above mentioned imaging studies.     ASSESSMENT & PLAN:   Roy Henry is a 50yrold male with spasticity due to MS, affecting LUE/BLE limbs.  he has been followed in our outpatient PM&R clinics and is felt to be a good candidate for intrathecal baclofen for additional spasticity management as symptoms are inadequately controlled on current regimen.      We will proceed with intrathecal baclofen trial as detailed below.        Neuro: Physical therapist will  evaluate the patient before ITB trial, and then at 2 and 4 hours after injection to determine response to the injection.  Nursing to check vital signs and patient comfort q358m for first 2 hours, then hourly following procedure.  Pt will be placed on bed rest for 2 hours after ITB trial, NPO until 3-4 hours after injection, and all po anti-spasticity medications will be held until 3-4 hours after the injection.  Pain: prn norco per home dosage  Pruritus: prn benadryl  N/V: prn phenergan           PROCEDURE NOTE:  Informed consent was obtained from the patient for intrathecal baclofen trial. Benefits would be to decrease spasticity.  Risks of lumbar puncture include bleeding, nerve injury/scarring that could cause weakness/numbness/bowel/bladder dysfunction, infection (meningitis), allergic reaction, spinal/dural headache.  Risks of injection of medication into the intrathecal space include hypotension (potentially requiring IV fluids or medication), oversedation (potentially requiring supplemental oxygen or more significant interventions), failure to adequately improve spasticity.       IR gained access to the intrathecal space without difficulty.   no bleeding was encountered.  At 11:00, 27m of 520m/mL intrathecal baclofen was injected into the intrathecal space over the course of a 1 minute push. .  Procedure was tolerated.  Vitals remained stable throughout.      15 minutes after the injection, I was paged to bedside given new onset urticaria of the face, trunk and BUE. Patient initially complained of shortness of breath, which quickly subsided. RN gave 50 mg IV benadryl, with improvement in urticaria. I examined the patient and his lung fields were clear, he was calm and conversationally appropriate off of O2. He had no swelling of the mouth/tongue, no evidence of angioedema. He did have splotchy scattered urticaria present on the face, BUE, and trunk. The urticaria did improve in appearance over the  course of my time at bedside.     FOLLOW-UP EXAM AT 2 HOURS POST-INJECTION:  MAS: 1+L EF, pronator. Much easier to abduct hips, but still with MAS 2-3 within available range. MAS 1 BLE KFs.    FOLLOW-UP EXAM AT 4 HOURS POST-INJECTION:  ROM: marginal increases in hip abduction (3-4 cm), KE improved by 5-10  bilaterally.   MAS: 1+L EF, pronator. MAS 0 B/L KF within available range    Physical Therapy Assessment:  Please reference PT notes of same date    OVERALL IMPRESSION OF RESPONSE TO INTRATHECAL BACLOFEN INJECTION:    Would recommend against intrathecal pump placement due to the development of post-procedural urticaria, concerning for allergic reaction to either lidocaine or baclofen, or a component of the preparation. He has had each of the medications in the past, so I am unclear why he developed an allergic reaction today.   Patient did have substantial reduction in tone in BLE, but has fixed contractures of hips/knees, L hip pseudoarthrosis, and little volitional movement of BLE; do not anticipate that reduction in tone would confer ambulatory potential, which is one of Mr. GaSardotated goals. Improved tone may provide improved peri-care, ADLs, seating, pain, and ability to gradually address contractures.    -Given his tolerance to 80 mg daily PO baclofen without allergy, I do not suspect the intrathecal baclofen to be the offending agent, however the risks of anaphylaxis likely outweigh the benefits of pump placement.   -One could consider a referral to an allergist to further explore his urticarial reaction to this procedure.  -Other options for tone management include PO dantrolene, botulinum toxin to B/L hamstrings/adductors.         DISCHARGE NOTE:  Patient was able to resume diet and oral spasticity medications 3-4 hours post-injection.  Medically cleared for discharge home.  No changes in medications, activity, or therapy orders.  Patient will be seen in follow-up in PM&R clinic.  Discharge Status:  stable  Complications: Urticaria, resolved with benadryl, as detailed above.       Electronically Signed  by:  Electronically signed by:    Donell Beers, DO  Attending Physician  Department of Physical Medicine and [place] [facility]

## 2021-03-18 NOTE — Allied Health Consult (Signed)
PM & R -- ACUTE CARE SERVICE      PHYSICAL THERAPY EVALUATION     Name: Roy Henry   MRN: [id number]   Date of Service: 03/18/2021  Current Hospital Unit: Interventional Radiology  Time In: 7:50AM    Total Time: 21 Minutes                                                                                                                                     INTAKE INFORMATION AND HISTORY:                       Therapy Consult(s) Ordered: Physical Therapy  Primary Service: PM&R  Date of Admission: 03/18/2021  Diagnosis: Multiple Sclerosis with Spastic paresis  Language: English    Precautions: None  Patient WAS wearing a surgical mask  Droplet precautions were followed when caring for the patient.   PPE used by provider during encounter: Surgical mask and Gloves  Others present during this exam: Lenise Herald, SPT; Jeanne Ivan, PT;   Med students: Roy Henry & Roy Henry    History of Present Illness/Injury (Including pertinent test results & procedures):  Per MD note from 11/30/2020: "Roy Henry is a 50yr-old right-handed man with a history of multiple sclerosis with severe bilateral lower extremity spasticity.  The patient states that he was living with the assistance of an attendant [facility] a [facility], and has lived in a skilled nursing facility ever since.  He states he is not getting much therapy at a skilled nursing facility but has been getting range of motion from the aids there to help stretch his lower extremities.  He remains relatively dependent for his bed mobility, transfers, toileting, etc.  He is on oral baclofen for his spasticity, however his spasticity remains severe.  He is being sent here for evaluation for possible baclofen pump placement.             Of note, the patient has had small bowel obstruction requiring emergent exploratory laparotomy and resection of small bowel that was complicated by SMV thrombosis,  requiring a second exploratory laparotomy and small bowel resection in November 2021.  The patient subsequently developed an incisional hernia.  The patient also was found to have a right femoral artery thrombosis and a symptomatic right inguinal hernia.  The patient subsequently underwent on 09/21/2020 open ventral hernia repair with mesh and possible abdominal wall reconstruction using transverse abdominal muscle release and possible right inguinal hernia repair with mesh"    Patient's tone reportedly interferes with and/or impacts:   Bed Mobility, Bed Positioning, Passive ROM, Active ROM, Ability to Perform ADLs, Skin Integrity, Sitting Position,   Transfers, Balance, Pain, Energy Demands, Caregiver Demands     Past Medical History:    Past Medical History:   Diagnosis Date  GERD (gastroesophageal reflux disease)     Hypertension     Immobility     bed/wheelchair bound    Multiple sclerosis ([place])          Past Surgical History  Past Surgical History:   Procedure Laterality Date    EXPLORATORY LAPAROTOMY  02/09/2020    SB resection and appendectomy for SBO, complicated by sepsis, SMV thrombosis    EXPLORATORY LAPAROTOMY      SB resection 2nd time    REPAIR, HERNIA, INCISIONAL  09/21/2020    open Clarise Cruz repair with abdominal wall component separation, bilateral transversus abdominis release, anterior abdominal wall mesh and right inguinal hernia repair with mesh          Social History:    Social History     Tobacco Use    Smoking status: Former    Smokeless tobacco: Never    Tobacco comments:     stopped 14 yrs ago   Substance Use Topics    Alcohol use: Not Currently    Drug use: Not Currently     Types: Marijuana     Comment: stopped 7 months ago         SUBJECTIVE EXAM:    Current Living Situation: SNF  Support at Time of Discharge:  24/7 custodial care from staff at [place] at Discharge:  Skilled nursing or other medical facility  Environmental Barriers:  No entry steps  Assistive Devices Owned:  hospital bed  Adaptive Equipment Owned: N/A  Prior Level of Function: Bed bound, Needs assist for all ADLs.  Mental Status: Other - Alert and appropriate. Follows commands well.    Pain Level / Chief Complaint: Denies pain. Tolerates ROM well.    OBJECTIVE EXAMINATION:    Observation: Patient supine in bed with both hips and knees full flexed and right hip adducted and internally rotated maximally with pillows under and between knees.           Range of Motion--est.   (without goniometer) Right extremity  Left extremity   Shoulder extension full full   Shoulder Internal Rotation full full   Shoulder External Rotation 0 full   Elbow flexion 90 full   Elbow extension -5 -5   Pronation full full   Supination 5 full   Wrist extension full full   Wrist flexion 30 full   Finger extension MCPs, PIPs, & DIPs in varying degrees of flexion between 15-45 degrees Full, except limited in 5th digit   Finger flexion full full   Hip aDduction full full   Hip aBduction 26.5cm between tibial tuberosities with  2-PTs pulling knees apart w/ overpressure   Hip extension full -85   Hip flexion 60 full   Knee flexion full full   Knee extension -90 -100   Ankle plantarflexion    Ankle dorsiflexion Full PF;   5 DF Full PF;  7 DF   Comments:         Spasticity   (In Available PROM)  R extremity MAS    L extremity MAS    Shoulder extensors 0/4 0/4   Shoulder Internal Rotators 0/4 0/4   Elbow flexors   0/4  1+/4   Elbow extensors   1/4 0/4   Pronators 0/4   0/4   Supinators 0/4 0/4   Wrist extensors 1/4 0/4   Wrist flexors   1/4  Clonus   Finger flexors   1/4  0/4   Hip adductors  4/4 4/4  Hip extensors 3/4 0/4   Hip flexors  4/4  4/4   Knee flexors   1/4  0/4   Knee extensors   0/4  0/4   Ankle plantarflexion    Ankle clonus  0/4 0/4   Modified Ashworth Scale (MAS):  0 No increase in muscle tone  1  Slight increase in muscle tone, manifested by a catch and release or by minimal resistance at the end range of the ROM when the affected part is  moved in flexion and extension.  1+  Slight increase in muscle tone, manifested by a catch and release, followed by minimal resistance throughout the remainder of the ROM.  2  More marked increase in muscle tone through most if the ROM, but affected part easily moved.  3 Considerable increase in muscle tone - passive movement difficult.   4 Rigid in flex or ext.            STRENGTH RIGHT   LEFT   Upper Traps (Shrug) NT/5 NT/5   Deltoids 2/5 5/5   Shoulder extensors 4/5 5/5   Shoulder external rotators 0/5 5/5   Shoulder internal rotators 0/5 5/5   Elbow flexors  0/5 5/5   Elbow extensors  1+/5 4+/5   Pronators 0/5  5/5   Supinators 0/5 5/5   Wrist extensors 0/5 5/5   Wrist flexors  2/5 3/5   Finger Extensors 0/5 4/5   Finger flexors  3-/5 5/5; except   5th digit 4/5   Abdominals NT/5 NT   Lower Extremities  UA to test due to  Severe hypertonicity   Strength Scale:  0  No palpable contraction  1  Palpable contraction  1+ Palpable contraction with trace movement  2- Partial AROM when gravity eliminated  2  Full AROM when gravity eliminated  2+ Partial AROM against gravity, can't take resistance  3-  Partial AROM against gravity, can take some resistance  3  Full AROM against gravity, can't take resistance  3+ Full AROM against gravity, can take slight resistance  4- Muscle gives with mild resistance  4  Muscle gives with firm resistance or fatigues in < 4 seconds  4+ Functional strength, but below expected normal (gives with strong resistance)  5  Normal strength, Muscle does not give with strong resistance       Demonstrated Functional Activities:   Key: Dep=Dependent     Max=Maximal     Mod=Moderate     Min=Minimal     CG=Contact Guard     SB=Stand-By     S=Supervised     I=Independent     NA=Not Applicable     NT=Not Tested     N=Normal     G=Good     F=Fair     P=Poor     U=Unable     FWW=Front-Wheeled Walker     AC=Axillary Crutches      SPC=Single-Point Cane     Level of Assistance Dep Max Mod Min CG SB S I NA NT Comments   Rolling   X             Scooting   X             Sidelying/ Supine to Sit         X     Transfer Sit to Stand         X     Transfer Bed to Chair  X     WC Mobility         X     Gait           X     Up/Down Step/Stairs         X     ADLs              Comments: Dependent for care, but able to use left UE functionally.   Patient can weakly grip with right hand, but lacks ability to open hand.      Balance: Not able to sit at EOB due to severity of Hip flexor/adductor tone.        No additional interventions beyond this evaluation were provided to the patient at this time.    ---Education intervention listed below PT assessment---        Patient Status after PT session: Supine in bed. Care handed off to Dr. Garry Heater for consenting for procedure.      ASSESSMENT / RECOMMENDATIONS:    Physical Therapy Assessment:  The patient is a 50y[facility] appropriateness of surgical implantation of ITB pump.   He presents with the following problems: abnormal muscle tone, abnormal posture, balance impairment, difficulty performing ADLs, impaired coordination, poor motor control, and weakness.  The goal is to reduce overall spasticity in order to improve the aforementioned problems.  PT will reassess 2 hours and 4 hours post ITB injection in order to help determine the effectiveness of intrathecal baclofen on this patient.   Other Consults Requested: N/A    Recommendations for Nursing:   Patient is generally placed on bedrest for 2 hours after the lumbar puncture for baclofen injection.    Please contact PT to notify them of the time of injection in order to ensure the punctuality of reassessment after 2  hours.    CURRENT Discharge Recommendations:        [place]position[place]        Current Level of Assistance or Supervision:  Dependent for Total Care        Continued Physical Therapy:  PT at SNF                            Equipment Size: N/A        Equipment:  Has DME in place at SNF                Referral Recommendations (for follow up): N/A    Patient / Caregiver Education Today:   The patient was informed and acknowledge understanding of the following:  -The purpose for this PT visit, including the plan involving ongoing examination and intervention techniques.    -Every effort would be made by the PT to protect their privacy, modesty, dignity, and safety.    -While the PT may encourage and assist the patient to participate through pain or discomfort they have the autonomy to stop or refuse examination/treatment at any time if they feel uncomfortable, unsafe, or unwell.  They also has the right to request another provider.      Method of Teaching: Verbal    Learner: Patient    Response: Verbalizes understanding    GOALS / TREATMENT PLAN / FUNCTIONAL PROGNOSIS:  Patient's Goals: To be able to live independently again.    Physical Therapy Goals:  (To be achieved by end-of-day.)  Patient to demonstrate  improved ROM, reduced tone, and increased ability to assist caregivers with self care.    Prognosis:   Good for stated goals; Unclear effects of Baclofen  Barriers that may interfere with PT POC: None      Treatment Plan:    Bed Mobility Training  Coordination Training  Therapeutic Exercise  Patient Education  Durable Medical Equipment Recommendations     Recommended Frequency of Treatment: Reassess patient at 2 hours and 4 hours post-injection.      Recommended Duration of Treatment: 3 visits     Interim Report Due:  N/A     Patient's Clinical Presentation: Stable/Uncomplexed   The components of this evaluation necessitated a Low complexity level of clinical decision making.      Reported by:  Wende Bushy, PT, DPT, NCS  Physical Therapist II   Board-Certified Specialist in  Neurologic Physical Therapy  PI # [phone]  [facility]  Department of PM&R  Acute Care Section  Office: [phone]  Vocera or [facility] Text     PT service pager: [phone]

## 2021-03-18 NOTE — Allied Health Progress (Signed)
PHYSICAL THERAPY PROGRESS NOTE & DISCHARGE SUMMARY    Patient Name: Roy Henry  MRN: [id number]   Date of Service: 03/18/2021   Hospital Unit: Interventional Radiology  Time in: 3:15PM  Total time: 12 Minutes  Personal Protective Equipment Utilized: Gloves and Surgical Mask    Was a new therapy order generated with a "Pending Discharge" priority designation?  No. Not applicable--A new PT/OT order was not issued     RN cleared patient to work with PT.   Other personnel present to assist during therapy session: Lenise Herald, SPT    Observation: Supine in bed.  Hips flexed and adducted with full right hip internal rotation.   Patient with asymptomatic bradycardia (40s-50s) during exam.  BP is normotensive.      Extremity Status:                                                                                     Range of Motion--est.   (without goniometer) Right extremity  Left extremity   Hip aBduction 27cm between tibial tuberosities with  1 PT pulling knees apart with overpressure   Hip extension full Flexed 110-60 degrees   Hip flexion 40 full   Knee flexion full full   Knee extension -90 -105   Ankle plantarflexion    Ankle dorsiflexion Full PF;   5 DF Full PF;  10 DF   Notes:                            Spasticity   (In Available PROM)  R extremity MAS    L extremity MAS    Hip adductors  1+/4 1/4   Hip extensors 1/4 0/4   Hip flexors  0/4  0/4   Knee flexors   0/4  0/4   Knee extensors   0/4  0/4   Ankle plantarflexion    Ankle clonus  0/4 0/4   Modified Ashworth Scale (MAS):  0 No increase in muscle tone  1  Slight increase in muscle tone, manifested by a catch and release or by minimal resistance at the end range of the ROM when the affected part is moved in flexion and extension.  1+  Slight increase in muscle tone, manifested by a catch and release, followed by minimal resistance throughout the remainder of the ROM.  2  More marked increase in muscle tone through most if the ROM, but affected part easily  moved.  3 Considerable increase in muscle tone - passive movement difficult.   4 Rigid in flex or ext.    Assessment: Slight increase in tone in hip adductors with reduction in abduction range to pre-injection distance compared to the 2-hour post injection assessment.      Patient Status after PT treatment: Supine in bed. Care handed off to RNs.    CURRENT Discharge Recommendations:        Disposition: [place] (SNF)-Custodial        Is Patient Cleared for Disposition above: yes  Current Level of Assistance or Supervision:  Dependent for Total Care  Continued Physical Therapy:  PT at SNF  Equipment Size: N/A        Equipment:  Has all DME at SNF    PT Plan for Next Visit: N/A--Patient discharged from PT.    Wende Bushy, PT, DPT, NCS  Physical Therapist II   Board-Certified Specialist in  Neurologic Physical Therapy  PI # [phone]  [facility]  Department of PM&R  Acute Care Section  Office: [phone]  Vocera or [facility] Text     PT service pager: [phone]

## 2021-03-18 NOTE — Allied Health Consult (Signed)
PM&R -- ACUTE CARE  SERVICE  PHYSICAL THERAPY RE-EVALUATION    Patient Name: Roy Henry  MRN: [id number]   Date of Service: 03/18/2021   Current Hospital Unit: Interventional Radiology  Time in: 1:25PM   Time for Eval: 25 minutes    Date of Admission: 03/18/2021  Primary Service:  PM&R  Diagnosis: Multiple Sclerosis with Spastic paresis    Reason for this RE-EVALUATION: Intrathecal Baclofen injection placed at 11:00AM.  PT to assess affect of drug on patient approximately 2 hours & 4 hours post injection.           Precautions: Bedrest  Patient WAS wearing a surgical mask  Droplet precautions were followed when caring for the patient.   PPE used by provider during encounter: Surgical mask and Gloves  Others present during this exam: Lenise Herald, SPT; Jeanne Ivan, PT;   Med students: Tommye Standard & Karmtej Cheema    SUBJECTIVE EXAM:    Mental Status: Other - Sleepy, but appropriate. Follows commands well and participates well.    Pain Level / Chief Complaint: 0/10 pain, but reports some pain with end-range during spasticity testing.  Patient says he normally takes Norco every 4 hours.  RN notified.    OBJECTIVE EXAMINATION:    Observation: Supine in bed.  Hips flexed and adducted with full right hip internal rotation.   Patient with asymptomatic bradycardia (40s-50s) during exam.  BP is normotensive.     Extremity Status:                                                          Range of Motion--est.   (without goniometer) Right extremity  Left extremity   Shoulder extension full full   Shoulder Internal Rotation full full   Shoulder External Rotation 0 full   Elbow flexion 90 full   Elbow extension -5 -5   Pronation full full   Supination 5 full   Wrist extension full full   Wrist flexion 30 full   Finger extension MCPs, PIPs, & DIPs in varying degrees of flexion between 15-45 degrees Full, except limited in 5th digit   Finger flexion full full   Hip aDduction full full   Hip aBduction 30cm between tibial  tuberosities with  1 PT pulling knees apart   Hip extension full Flexed 110-60 degrees   Hip flexion 40 full   Knee flexion full full   Knee extension -90 -105   Ankle plantarflexion    Ankle dorsiflexion Full PF;   5 DF Full PF;  10 DF   Notes: Improved Hip adductor tone with only 1 person needed to abduct and increased distance by 3.5cm. Significant reduction in spasticity in Hip adductors & right hip flexors/extensors (below).                                 Spasticity   (In Available PROM)  R extremity MAS    L extremity MAS    Shoulder extensors 0/4 0/4   Shoulder Internal Rotators 0/4 0/4   Elbow flexors   0/4  1+/4   Elbow extensors   1/4 0/4   Pronators 0/4   0/4   Supinators 0/4 0/4   Wrist extensors 1/4 0/4  Wrist flexors   0/4  Clonus   Finger flexors   1/4  0/4   Hip adductors  1/4 1/4   Hip extensors 1/4 0/4   Hip flexors  0/4  0/4   Knee flexors   0/4  0/4   Knee extensors   0/4  0/4   Ankle plantarflexion    Ankle clonus  0/4 0/4   Modified Ashworth Scale (MAS):  0 No increase in muscle tone  1  Slight increase in muscle tone, manifested by a catch and release or by minimal resistance at the end range of the ROM when the affected part is moved in flexion and extension.  1+  Slight increase in muscle tone, manifested by a catch and release, followed by minimal resistance throughout the remainder of the ROM.  2  More marked increase in muscle tone through most if the ROM, but affected part easily moved.  3 Considerable increase in muscle tone - passive movement difficult.   4 Rigid in flex or ext.                                                          STRENGTH RIGHT   LEFT   Upper Traps (Shrug) NT/5 NT/5   Deltoids 2/5 5/5   Shoulder extensors 4/5 5/5   Shoulder external rotators 0/5 5/5   Shoulder internal rotators 0/5 5/5   Elbow flexors  0/5 5/5   Elbow extensors  1+/5 4+/5   Pronators 0/5  5/5   Supinators 0/5 5/5   Wrist extensors 0/5 5/5   Wrist flexors  2/5 3/5   Finger Extensors 0/5 4/5    Finger flexors  3-/5 5/5; except   5th digit 4/5   Abdominals NT/5 NT   Lower Extremities  UA to test due to  Severe hypertonicity   Strength Scale:  0  No palpable contraction  1  Palpable contraction  1+ Palpable contraction with trace movement  2- Partial AROM when gravity eliminated  2  Full AROM when gravity eliminated  2+ Partial AROM against gravity, can't take resistance  3-  Partial AROM against gravity, can take some resistance  3  Full AROM against gravity, can't take resistance  3+ Full AROM against gravity, can take slight resistance  4- Muscle gives with mild resistance  4  Muscle gives with firm resistance or fatigues in < 4 seconds  4+ Functional strength, but below expected normal (gives with strong resistance)  5  Normal strength, Muscle does not give with strong resistance         Demonstrated Functional Activities:   Key: Dep=Dependent     Max=Maximal     Mod=Moderate     Min=Minimal     CG=Contact Guard     SB=Stand-By     S=Supervised     I=Independent     NA=Not Applicable     NT=Not Tested     N=Normal     G=Good     F=Fair     P=Poor     U=Unable     FWW=Front-Wheeled Walker     AC=Axillary Crutches     SPC=Single-Point Cane     Level of Assistance Dep Max Mod Min CG SB S I NA NT Comments   Rolling   X X  Rolling to right with rail.  Dependent to roll to left.   Scooting   X             Sidelying/ Supine to Sit         X  Unable to do at baseline.  On bedrest post injection.   Transfer Sit to Stand         X     Transfer Bed to Chair         X     WC Mobility         X     Gait           X     Up/Down Step/Stairs         X     ADLs          X    Comments: Has not been up in his WC in over a year.      Balance: Unable to sit up.          No additional interventions beyond this evaluation were provided to the patient at this  time.    ---Education intervention listed below PT assessment---        Patient Status after PT session: Supine in bed with waffle cushion under sacrum.  Care handed off to RN.      ASSESSMENT / RECOMMENDATIONS:    Physical Therapy Assessment:  The patient is a 50yrold [facility] help determine appropriateness of surgical implantation of ITB pump.  Intrathecal baclofen resulted in significant reduction in hip adductor tone and small improvement in hip abduction ROM, but the patient also started to develop a systemic rash after injection (cause undetermined).      Other Consults Requested: N/A    Recommendations for Nursing:   Consider placing waffle boots to protect patient's heels.      CURRENT Discharge Recommendations:        Disposition: [place] [place] Custodial care        Current Level of Assistance or Supervision:  Dependent for Total Care        Continued Physical Therapy:  PT at SNF                            Equipment Size: N/A        [facility]               Referral Recommendations (for follow up): N/A    Patient / Caregiver Education Today:   Discussed Assessment (detailed above).        Method of Teaching: Verbal    Learner: Patient    Response: Verbalizes understanding    GOALS / TREATMENT PLAN / FUNCTIONAL PROGNOSIS:  Patient's Goals: To be able to be independent again.    Physical Therapy Goals:  (To be achieved by end-of-day.)  Patient to demonstrate safe basic functional mobility skills with or without caregiver assistance.   Patient to present at baseline level of function, indicating no need for further acute PT services.    Prognosis:   Good for stated goals  Barriers that may interfere with PT POC: None      Treatment Plan:    Bed Mobility Training  Therapeutic Exercise  Patient Education     Recommended Frequency of  Treatment: Consider one more visit to  see if effects of baclofen have worn off prior to discharge.      Recommended Duration of Treatment: One more visit to conclude trial.    Interim Report Due:  N/A     Patient's Clinical Presentation: Evolving with changing characteristics  The components of this evaluation necessitated a Low complexity level of clinical decision making.     Reported by:  Wende Bushy, PT, DPT, NCS  Physical Therapist II   Board-Certified Specialist in  Neurologic Physical Therapy  PI # [phone]  [facility]  Department of PM&R  Acute Care Section  Office: [phone]  Vocera or [facility] Text     PT service pager: [phone]

## 2021-03-18 NOTE — Nurse Procedure (Signed)
Pt developed hives to face and bilateral arms. Dr. Jones Bales at bs. Dr. Garry Heater paged.

## 2021-03-18 NOTE — Nurse Procedure (Signed)
POST PROCEDURE INSTRUCTIONS FOR: Lumbar Puncture with IT [place]    Today you have had a Lumbar Puncture or Myelogram.  Results of this exam will be discussed in a follow-up appointment with your physician.  If you do not have a follow-up appointment, please contact your physicians' office to schedule one.    Although side effects from this procedure are not common, you may notice the following:  A headache or nausea for 1-2 days after the procedure.  This is normal, and the headaches can usually be treated with Tylenol (acetaminophen).  It is important to rest and drink lots of fluids should you develop a headache.  Additionally, caffeinated drinks can be useful in preventing headaches or treating them once they occur.  Consult your physician if you require medications to treat nausea.  Dizziness or discomfort or weakness in the legs or arms. These sensations as well should only last for 1-2 days.  Should they persist beyond 2 days, notify your physician or the Neuro-radiologist.  See below for a complete list of symptoms that should be reported.    Diet  You may resume your normal diet.  If you are not on any fluid restrictions, you should force fluids for the next 24 hours.  Do not drink alcohol for the next 24 hours.    Activity  You should rest for the next 12 hours.  Avoid strenuous activity or lifting of objects greater than 25 pounds for the next 24 hours.  You may resume your regular activities after 24 hours.  Sitting up in a 30-45 degree angle for the next 4-6 hours will help to clear the contrast dye from your body and help prevent a headache from developing.    Medications  Unless directed otherwise, resume your prescribed medications.  If you have concerns about resuming certain medications, please ask your physician or the Neuro-radiologist.    Comments: Side Effects  Nausea and vomiting  Drowsiness/sedation  Pain  Hypotension  Vasovagal response  Seizures paresthesias  Urinary  retention  Dizziness  Respiratory depression  coma      Procedure Site Care  Adhesive bandages have been applied to the puncture site(s).  They may have small amounts of clear or light pink drainage for 1-2 days.  This is normal.  Remove the bandage(s) tomorrow.  You may apply loose gauze dressings or band-aid(s) over the puncture site(s) to prevent soiling of your clothing.  No other special care is required at the puncture site(s) and you may shower after 24 hours.    You should call your physician or the Neuro-radiologist immediately if any of the following occur:  Chills or temperature of 101 degree F or greater  Development of a severe headache not treatable with pain medication  Development of nausea, stiff neck, seizures, loss of bladder function  Increased confusion or irritability, difficulty concentrating or staying alert  Headache, arm/leg pain or weakness that lasts beyond 1-2 days  Questions about the procedure or symptoms you are experiencing    The Neuro-radiologist can be contacted by calling [phone] during business hours and [phone] after hours and ask the operator for the Neuro-radiologist on-call.

## 2021-03-18 NOTE — Discharge Instructions (Signed)
POST PROCEDURE INSTRUCTIONS FOR: Lumbar Puncture with IT [place]    Today you have had a Lumbar Puncture or Myelogram.  Results of this exam will be discussed in a follow-up appointment with your physician.  If you do not have a follow-up appointment, please contact your physicians' office to schedule one.    Although side effects from this procedure are not common, you may notice the following:  A headache or nausea for 1-2 days after the procedure.  This is normal, and the headaches can usually be treated with Tylenol (acetaminophen).  It is important to rest and drink lots of fluids should you develop a headache.  Additionally, caffeinated drinks can be useful in preventing headaches or treating them once they occur.  Consult your physician if you require medications to treat nausea.  Dizziness or discomfort or weakness in the legs or arms. These sensations as well should only last for 1-2 days.  Should they persist beyond 2 days, notify your physician or the Neuro-radiologist.  See below for a complete list of symptoms that should be reported.    Diet  You may resume your normal diet.  If you are not on any fluid restrictions, you should force fluids for the next 24 hours.  Do not drink alcohol for the next 24 hours.

## 2021-03-18 NOTE — Progress Notes (Signed)
NEURORADIOLOGY   BRIEF PROGRESS NOTE  03/18/21 1117    49 y/o male s/p LP with intrathecal baclofen administration. Called to bedside after procedure for itchiness/rash. Symptoms began approximately 15 minutes after procedure. Patient reports hives on trunk, neck, and arms. No breathing difficulty or tightness in throat. VSS. '50mg'$  IV benadryl administered with improvement in symptoms. Symptoms consistent with allergic reaction however the causative agent is unclear at this time. Patient received baclofen and lidocaine during the procedure which he has taken previously. No iodinated contrast was administered. Will continue to monitor.    Brunilda Payor, MD  PGY-5 Radiology Resident

## 2021-03-23 ENCOUNTER — Ambulatory Visit (INDEPENDENT_AMBULATORY_CARE_PROVIDER_SITE_OTHER): Payer: Medicare Other

## 2021-03-23 ENCOUNTER — Telehealth: Payer: Self-pay

## 2021-03-23 ENCOUNTER — Ambulatory Visit: Payer: Medicare Other

## 2021-03-23 DIAGNOSIS — Z20822 Contact with and (suspected) exposure to covid-19: Secondary | ICD-10-CM

## 2021-03-23 LAB — COVID-2019 RNA, QUAL: SARS-CoV-2: NOT DETECTED

## 2021-03-23 NOTE — Nursing Note (Addendum)
The following procedures were done per physician's order: Patient was swabbed for covid testing.     Patient WAS wearing a surgical mask  Contact, Droplet, Airborne, Neutropenic, and Resistant organism precautions were followed when caring for the patient.   PPE used by provider during encounter: Surgical mask, N95 mask, Face Shield/Goggles, Gown, and Gloves    Swabbed both nostrils. Swabbed by Alma Pelayo, LVN  Patient tolerated well.    Cadience Bradfield, MA

## 2021-03-24 ENCOUNTER — Ambulatory Visit: Payer: Medicare Other | Admitting: NURSE ANESTHETIST

## 2021-03-24 ENCOUNTER — Encounter: Payer: Self-pay | Admitting: GASTROENTEROLOGY

## 2021-03-24 ENCOUNTER — Ambulatory Visit (HOSPITAL_BASED_OUTPATIENT_CLINIC_OR_DEPARTMENT_OTHER): Payer: Medicare Other | Admitting: NURSE ANESTHETIST

## 2021-03-24 ENCOUNTER — Ambulatory Visit
Admission: RE | Admit: 2021-03-24 | Discharge: 2021-03-24 | Disposition: A | Discharge status: Home / Self Care | Payer: Medicare Other | Source: Ambulatory Visit | Attending: GASTROENTEROLOGY | Admitting: GASTROENTEROLOGY

## 2021-03-24 DIAGNOSIS — D123 Benign neoplasm of transverse colon: Secondary | ICD-10-CM | POA: Insufficient documentation

## 2021-03-24 DIAGNOSIS — Z1211 Encounter for screening for malignant neoplasm of colon: Secondary | ICD-10-CM | POA: Insufficient documentation

## 2021-03-24 DIAGNOSIS — Z980 Intestinal bypass and anastomosis status: Secondary | ICD-10-CM

## 2021-03-24 DIAGNOSIS — K635 Polyp of colon: Secondary | ICD-10-CM

## 2021-03-24 DIAGNOSIS — K648 Other hemorrhoids: Secondary | ICD-10-CM

## 2021-03-24 DIAGNOSIS — Z9049 Acquired absence of other specified parts of digestive tract: Secondary | ICD-10-CM

## 2021-03-24 DIAGNOSIS — Z800 Family history of malignant neoplasm of digestive organs: Secondary | ICD-10-CM | POA: Insufficient documentation

## 2021-03-24 MED ORDER — EPINEPHRINE 0.1 MG/ML INJECTION SYRINGE
0.3000 mg | INJECTION | INTRAMUSCULAR | Status: DC | PRN
Start: 2021-03-24 — End: 2021-03-30

## 2021-03-24 MED ORDER — SIMETHICONE 40 MG/0.6 ML ORAL DROPS,SUSPENSION
40.0000 mg | Freq: Once | ORAL | Status: AC
Start: 2021-03-24 — End: 2021-03-24
  Administered 2021-03-24: 40 mg
  Filled 2021-03-24: qty 0.6

## 2021-03-24 MED ORDER — PROPOFOL 10 MG/ML INTRAVENOUS EMULSION
INTRAVENOUS | Status: AC
Start: 2021-03-24 — End: 2021-03-24
  Filled 2021-03-24: qty 100

## 2021-03-24 MED ORDER — INTRAOP PROPOFOL INJ 100 ML VIAL (BOLUS+INFUSION)
Status: DC | PRN
Start: 2021-03-24 — End: 2021-03-24
  Administered 2021-03-24: 150 ug/kg/min via INTRAVENOUS

## 2021-03-24 MED ORDER — LACTATED RINGERS IV INFUSION
INTRAVENOUS | Status: DC
Start: 2021-03-24 — End: 2021-03-30

## 2021-03-24 MED ORDER — DIPHENHYDRAMINE 50 MG/ML INJECTION SOLUTION
12.5000 mg | INTRAMUSCULAR | Status: DC | PRN
Start: 2021-03-24 — End: 2021-03-30

## 2021-03-24 MED ORDER — FLUMAZENIL 0.1 MG/ML INTRAVENOUS SOLUTION
0.2000 mg | INTRAVENOUS | Status: DC | PRN
Start: 2021-03-24 — End: 2021-03-30

## 2021-03-24 MED ORDER — EPINEPHRINE 0.1 MG/ML INJECTION SYRINGE
1.0000 mg | INJECTION | INTRAMUSCULAR | Status: DC | PRN
Start: 2021-03-24 — End: 2021-03-30

## 2021-03-24 MED ORDER — EPINEPHRINE 0.1 MG/ML INJECTION SYRINGE
0.3000 mg | INJECTION | INTRAMUSCULAR | Status: DC | PRN
Start: 2021-03-24 — End: 2021-03-30
  Filled 2021-03-24: qty 10

## 2021-03-24 MED ORDER — LIDOCAINE HCL 2 % MUCOSAL SOLUTION
5.0000 mL | Freq: Once | Status: DC
Start: 2021-03-24 — End: 2021-03-30
  Filled 2021-03-24: qty 15

## 2021-03-24 MED ORDER — ONDANSETRON HCL (PF) 4 MG/2 ML INJECTION SOLUTION
4.0000 mg | INTRAMUSCULAR | Status: DC | PRN
Start: 2021-03-24 — End: 2021-03-30

## 2021-03-24 MED ORDER — LACTATED RINGERS IV INFUSION
INTRAVENOUS | Status: DC | PRN
Start: 2021-03-24 — End: 2021-03-24

## 2021-03-24 MED ORDER — ATROPINE 0.1 MG/ML INJECTION SYRINGE
0.5000 mg | INJECTION | INTRAMUSCULAR | Status: DC | PRN
Start: 2021-03-24 — End: 2021-03-30

## 2021-03-24 MED ORDER — FENTANYL (PF) 50 MCG/ML INJECTION SOLUTION
12.5000 ug | INTRAMUSCULAR | Status: DC | PRN
Start: 2021-03-24 — End: 2021-03-30

## 2021-03-24 MED ORDER — MIDAZOLAM 1 MG/ML INJECTION SOLUTION
0.5000 mg | INTRAMUSCULAR | Status: DC | PRN
Start: 2021-03-24 — End: 2021-03-30

## 2021-03-24 MED ORDER — NALOXONE 0.4 MG/ML INJECTION SOLUTION: 0.4000 mg | INTRAMUSCULAR | Status: DC | PRN

## 2021-03-24 NOTE — H&P (Addendum)
Gastroenterology/hepatology Pre-procedure History & Physical     Referring provider: Atup-Leavitt, Carolan Clines, MD  Procedure: colonoscopy    Subjective:  HPI: Roy Henry is a 50yrold male who presents for: screening. Fhx colon polyps. Previous hx of small bowel resection     [id number] negative.  Eyes: negative.  Ears, Nose, Mouth, Throat: negative.  CV: negative.  Resp: negative.  GI: negative.  GU: negative.  Musculoskeletal: negative.  Integumentary: negative.  Neuro: negative.  Psych: Mood pt's report, euthymic.  Endo: negative.  Heme/Lymphatic: negative.  Allergy/Immun: negative.     I did review all available medical, surgical, personal/social history.    Physical exam:  General Appearance: healthy, alert, no distress, pleasant affect, cooperative.  Nose:  normal.  Mouth: normal.  Neck:  Neck supple. No adenopathy, thyroid symmetric, normal size.  Heart:  normal rate and regular rhythm, no murmurs, clicks, or gallops.  Lungs: clear to auscultation.  Abdomen: BS normal.  Abdomen soft, non-tender.  No masses or organomegaly.  Extremities:  no cyanosis, clubbing, or edema.  Skin:  Skin color, texture, turgor normal. No rashes or lesions.    Airway Assessment:    Normal, Mallampati class 1, and Mouth opening: normal  ROM normal, thyromental distance: 4 finger breadth      Labs/imaging:  Performed at [facility] reviewed: see computer database.  Performed outside of [place] (reviewed if applicable): na    Impression/ plan:  colonoscopy  Patient is a candidate for moderate sedation.  Patient is ASA status: 2    The procedure, risks, benefits, and alternatives were explained.  All patient questions were answered.  The informed consent was signed, and will be scanned into the computer database at a later date.    Patient barriers to learning: none    Patient/family understanding: verbalizes    JCarolynn Sayers MD

## 2021-03-24 NOTE — Anesthesia Postprocedure Evaluation (Signed)
Patient: Roy Henry    COLONOSCOPY    Anesthesia Type: MAC    Stop Bang: 2    Vital Signs (Last Recorded):  BP: (!) 138/94 (03/24/21 1522)  Pulse: 62 (03/24/21 1522)  Resp: 11 (03/24/21 1522)  Temp Max: 36.9 C (98.5 F)  (Last 24 hours)  Temp: 36.9 C (98.5 F) (03/24/21 1348)  SpO2: 96 % (03/24/21 1522) on    Device (Oxygen Therapy): room air (03/24/21 1522)      Anesthesia Post Evaluation    Procedure: * No procedures listed *  Location: [place] [facility]  Anesthesia: Monitored Anesthesia Care    Patient location during evaluation: PACU  Patient participation: complete - patient participated  Level of consciousness: awake and alert  Pain management: adequate  Multimodal analgesia pain management approach    Patient did not have a perioperative block    Airway patency: patent  Two or more strategies used to mitigate risk of obstructive sleep apnea  Anesthetic complications: no  Cardiovascular status: blood pressure returned to baseline  Respiratory status: spontaneous ventilation and room air  Hydration status: euvolemic  Nausea and Vomiting: absent                   Leeroy Bock, MD

## 2021-03-24 NOTE — Anesthesia Preprocedure Evaluation (Addendum)
Anesthesia Evaluation      Medical History, Review of Systems, and Physical Exam    ROS includes input from [facility] Nurse  HPI: 50yrmale presenting for colonoscopyNo history of anesthetic complications  No family history of anesthetic complications.  Additional Assessment and Optimization notes:   Pt tested covid positive on 08/04/20    Airway   Mallampati: III  TM distance: >3 FB     Neck ROM is full.     Dental    (+) poor dentition, missing and chipped       Pulmonary - normal exam  (-) no shortness of breath, recent URI, sleep apnea    Patient's breath sounds clear to auscultation. Cardiovascular - cardiovascular exam normal  (+) hypertension (well controlled),    (-) angina    Rhythm: regular  Rate: normal  Patient has poor exercise tolerance. (Wheelchair bound d/t multiple sclerosis)   Neuro/Psych    (+) neuromuscular disease ( Multiple sclerosis),     Neuro/Psych ROS comment: Wheelchair bound d/t multiple sclerosis    MS tx:  rituximab infusion (started 07/16/20 and plan for next infusion now but he has not scheduled it, then q657mo Additionally he had valium increased to 104m70m times a day, baclofen 31m98mtimes a day    Chronic pain:  chronic pain from his spasms and from his hernia, and used to take Norc[place]325 every 4-6hr; now at rehab he is struggling to get his meds on time, written as Norco 5/[id number] PRN and has been taking it at least 3 times a day and feels he needs it q4hr.  GI/Hepatic/Renal   (+) GERD (well controlled),         Abdominal    Endo/Other    (-) diabetes  Smoking History  No history of smoking                   Medications reviewed:   No outpatient medications have been marked as taking for the 03/24/21 encounter (Anesthesia Event) with Jenni Thew, SarahArva ChafeA.     Patient Instructions:     NPO/NSAID/herbal and nutritional supplement guidelines per your surgery clinic.  Medications on DOS:  Amlodipine, Baclofen, Valium, Norco, Protonix    Aspirin preop instruction deferred to  surgeon, note routed to Dr Lyo  [facility] Infectious Disease Screen: ( Questionnaire updated 12/20/19)    1. In the last 2 weeks have you tested positive for the COVID-19 virus or been diagnosed with COVID-19? No. Pt tested positive on 08/04/20    2. In the last 2 weeks, have you had close contact with someone who is  sick from COVID-19? no    3.  Over the past 2 weeks, have you had  new symptoms not related to chronic illness of the following signs/symptoms: Fever >100 F(37.8 C), chills/repeated shaking with chills, muscle pain, headache, abdominal pain, diarrhea, Nausea, vomiting,rash, loss of taste/smell,or current respiratory symptoms such as cough and shortness of breath? no      We will allow 1 consistent adult visitor  on the appointment date. Per hospital mandate, visitors who are not fully vaccinated will need to provide proof of a negative test taken within 24 hours prior to the visit (if an antigen test), 48 hours prior (if a PCR test), or 72 hours if they are a repeat visitor who has already provided one of these other tests at their prior visit.  The definition of being 'fully vaccinated' for the  [facility] visiting policy is either proof of three doses of vaccine manufactured by [employer] or [place], or two doses of vaccine if one was from [employer].    All visitors must consistently wear a mask while in the hospital zone. Visitors will not be allowed if they have signs and symptoms of illness, including cough, runny nose, sneezing, fever or sore throat, or have been around someone with COVID- 19. Children less than 9 years old will not be allowed in the hospital premises per hospital policy. Visiting hours are between 9 a.m. and 9 p.m.    Patient verbalized understanding.                 Anesthesia Plan    ASA 4     MAC   (- Avoid succinylcholine due to MS and concern for upregulation of nACH receptors)  Intravenous induction    Postoperative administration of  opioids is intended.    Anesthetic plan and risks discussed with patient.  Resident/Fellow/CRNA discussed the plan with the attending.    I performed the pre-anesthetic exam and prescribed the anesthesia plan.  I reviewed the available note and agree with the documentation.

## 2021-03-24 NOTE — Discharge Instructions (Addendum)
[facility]    DISCHARGE INSTRUCTIONS FOR GI PROCEDURES with ANESTHESIA  During your procedure today air was pumped into your GI tract so your doctor could see clearly.  This MAY cause bloating, belching and a gassy feeling.  This will usually pass after you move around and go to the bathroom.  Please pass gas.  You were given sedation during your procedure: TODAY YOU CANNOT DRIVE, OPERATE MACHINERY, DRINK ANY ALCOHOL OR SIGN LEGAL DOCUMENTS for 24 hours.  Please have a responsible adult, 18 years or older, to escort you home and stay overnight with you after the procedure, to watch for any adverse reactions to your anesthesia or have any procedural complications.  Severe pain is not expected after this procedure.  Activities:  Rest the day of the procedure.  The day after the procedure, you may do whatever you feel able to do, unless otherwise directed by your physician.   During the procedure, we may have to lift your jaw to help you breathe better so you may experience some soreness afterwards.      COLONOSCOPY/FLEXIBLE SIGMOIDOSCOPY/RECTAL EUS  You may experience a gassy feeling after this procedure - move around, sit on a toilet and/or drink peppermint tea.  It can last for a day or two.  The prep you drank may still be working for a day or two.  You may resume your normal diet upon discharge.    The GI Lab is open Monday-Friday 8:00 am-5:00 PM, our phone number is [phone]    What to watch for: fever >101.5, severe abdominal pain, abdominal distention, chest pain, dizziness, weakness, fast pulse rate, bloody stools or any significant bleeding. If you encounter any of the symptoms mentioned and have questions, PLEASE CALL the GI FELLOW on-call after 5:00PM at [phone] or go to [facility].         Discharge Instructions for colonoscopy    Findings:  colonoscopy polyp(s)  hemorrhoids  Colon Polyps    A polyp is extra tissue that grows inside your  body. Colon polyps grow in the large intestine.  The large intestine, also called the colon, is part of your digestive system. It's a long, hollow tube at the end of your digestive tract where your body makes and stores stool.    Are polyps dangerous?    Most polyps are not dangerous. Most are benign which means they are not cancer. But over time, some types of polyps can turn into cancer. Usually, polyps that are smaller than a pea aren't harmful. But larger polyps could someday become cancer or may already be cancer. There are two common types of polyps, hyperplastic and adenomatous polyps.  Hyperplastic polyps generally remain small and are not precancerous.  Adenomatous polyps or adenomas, have the potential to grow large and become cancerous.  To be safe, doctors remove all polyps and test them to determine what type they are.    Who gets polyps?    Anyone can get polyps, but certain people are more likely than others. You may have a greater chance of getting polyps if you're over 64, you've had polyps before, someone in your family has had polyps, someone in your family has had cancer of the large intestine.   You may also be more likely to get polyps if you eat a lot of fatty foods, smoke, drink alcohol, don't exercise, or weigh too much.    What are  the symptoms?    Most small polyps don't cause symptoms. Often, people don't know they have one until the doctor finds it during a regular checkup or while testing them for something else.  Some people do have symptoms including bleeding from the anus, constipation or diarrhea that lasts more than a week, blood in the stool. Blood can make stool look black, or it can show up as red streaks in the stool.  If you have any of these symptoms, see a doctor to find out what the problem is.    How does the doctor test for polyps?    The doctor can use four tests to check for polyps:    Digital rectal exam. The doctor wears gloves and checks your rectum, the last part of  the large intestine, to see if it feels normal. This test would find polyps only in the rectum, so the doctor may need to do one of the other tests listed below to find polyps higher up in the intestine.    Barium enema. The doctor puts a liquid called barium into your rectum before taking x rays of your large intestine. Barium makes your intestine look white in the pictures. Polyps are dark, so they're easy to see.    Sigmoidoscopy:  With this test, the doctor can see inside your large intestine. The doctor puts a thin flexible tube into your rectum. The device is called a sigmoidoscope, and it has a light and a tiny video camera in it. The doctor uses the sigmoidoscope to look at the last third of your large intestine.    Colonoscopy:  This test is like sigmoidoscopy, but the doctor looks at all of the large intestine. It usually requires sedation.    Who should get tested for polyps?    Talk to your doctor about getting tested for polyps if you have symptoms, you're 28 years old or older, someone in your family has had polyps or colon cancer.    How are polyps treated?    The doctor will remove the polyp. Sometimes, the doctor takes it out during sigmoidoscopy or colonoscopy. Or the doctor may decide to operate through the abdomen.  The polyp is then tested for cancer.  If you've had polyps, the doctor may want you to get tested regularly in the future.    How can I prevent polyps?    Doctors don't know of any one sure way to prevent polyps. But you might be able to lower your risk of getting them if you eat more fruits and vegetables and less fatty food, don't smoke, avoid alcohol, exercise every day, lose weight if you're overweight.      Eating more calcium and folate can also lower your risk of getting polyps. Some foods that are rich in calcium are milk, cheese, and broccoli. Some foods that are rich in folate are chickpeas, kidney beans, and spinach.  Some doctors think that aspirin might help prevent  polyps. Studies are under way.     Points to Remember   A polyp is extra tissue that grows inside the body. Most polyps are not harmful.   Symptoms may include constipation or diarrhea for more than a week or blood                 on your underwear, on toilet paper, or in your stool.   Many polyps do not cause symptoms.   Doctors remove all polyps and test them for cancer.  Talk to your doctor about getting tested for polyps if              o you have any symptoms              o you're 21 years old or older              o someone in your family has had polyps or colon cancer        Hemorrhoids    The term hemorrhoids refers to a condition in which the veins around the anus or lower rectum are swollen and inflamed.  Hemorrhoids may result from straining to move stool. Other contributing factors include pregnancy, aging, chronic constipation or diarrhea, and anal intercourse.  Hemorrhoids are either inside the anus (internal) or under the skin around the anus (external).     What are the symptoms of hemorrhoids?    Many anorectal problems, including fissures, fistulae, abscesses, or irritation and itching (pruritus ani), have similar symptoms and are incorrectly referred to as hemorrhoids.  Hemorrhoids usually are not dangerous or life threatening. In most cases, hemorrhoidal symptoms will go away within a few days.      Although many people have hemorrhoids, not all experience symptoms. The most common symptom of internal hemorrhoids is bright red blood covering the stool, on toilet paper, or in the toilet bowl. However, an internal hemorrhoid may protrude through the anus outside the body, becoming irritated and painful. This is known as a protruding hemorrhoid.    Symptoms of external hemorrhoids may include painful swelling or a hard lump around the anus that results when a blood clot forms. This condition is known as a thrombosed external hemorrhoid.     In addition, excessive straining, rubbing, or  cleaning around the anus may cause irritation with bleeding and/or itching, which may produce a vicious cycle of symptoms. Draining mucus may also cause itching.     How common are hemorrhoids?    Hemorrhoids are very common in both men and women. About half of the population have hemorrhoids by age 50. Hemorrhoids are also common among pregnant women. The pressure of the fetus in the abdomen, as well as hormonal changes, cause the hemorrhoidal vessels to enlarge. These vessels are also placed under severe pressure during childbirth. For most women, however, hemorrhoids caused by pregnancy are a temporary problem.    How are hemorrhoids diagnosed?    A thorough evaluation and proper diagnosis by the doctor is important any time bleeding from the rectum or blood in the stool occurs. Bleeding may also be a symptom of other digestive diseases, including colorectal cancer.    The doctor will examine the anus and rectum to look for swollen blood vessels that indicate hemorrhoids and will also perform a digital rectal exam with a gloved, lubricated finger to feel for abnormalities.     Closer evaluation of the rectum for hemorrhoids requires an exam with an anoscope, a hollow, lighted tube useful for viewing internal hemorrhoids, or a proctoscope, useful for more completely examining the entire rectum.    To rule out other causes of gastrointestinal bleeding, the doctor may examine the rectum and lower colon (sigmoid) with sigmoidoscopy or the entire colon with colonoscopy. Sigmoidoscopy and colonoscopy are diagnostic procedures that also involve the use of lighted, flexible tubes inserted through the rectum.     What is the treatment?    Medical treatment of hemorrhoids is aimed initially at relieving symptoms. Measures to reduce symptoms include  tub baths several times a day in plain, warm water for about 10 minutes and application of a hemorroidal cream or suppository to the affected area for a limited  time.    Preventing the recurrence of hemorrhoids will require relieving the pressure and straining of constipation. Doctors will often recommend increasing fiber and fluids in the diet. Eating the right amount of fiber and drinking six to eight glasses of fluid (not alcohol) result in softer, bulkier stools. A softer stool makes emptying the bowels easier and lessens the pressure on hemorrhoids caused by straining. Eliminating straining also helps prevent the hemorrhoids from protruding.    Good sources of fiber are fruits, vegetables, and whole grains. In addition, doctors may suggest a bulk stool softener or a fiber supplement such as psyllium (Metamucil) or methylcellulose (Citrucel).    In some cases, hemorrhoids must be treated endoscopically or surgically. These methods are used to shrink and destroy the hemorrhoidal tissue. The doctor will perform the procedure during an office or hospital visit.    A number of methods may be used to remove or reduce the size of internal hemorrhoids. The most common is rubber band ligation in which a rubber band is placed around the base of the hemorrhoid inside the rectum. The band cuts off circulation, and the hemorrhoid withers away within a few days.     How are hemorrhoids prevented?    The best way to prevent hemorrhoids is to keep stools soft so they pass easily, thus decreasing pressure and straining, and to empty bowels as soon as possible after the urge occurs. Exercise, including walking, and increased fiber in the diet help reduce constipation and straining by producing stools that are softer and easier to pass.             Activity:    Rest today, resume usual activity tomorrow     Diet:    Resume usual diet:     Medication:    Resume all usual medications    Follow up:   Call primary care physician for follow-up appointment and We will notify you of your biopsy results via phone call or letter.If you do not receive notice of your results by three weeks, please  call us at the phone number provided below.      During your procedure, air was pumped into your GI tract so your doctor could see clearly to make a diagnosis and/or treat your problem.     Some possible side effects you may experience are:   - Discomfort due to a distended (bloated) abdomen which will subside after a few hours to two days.   - Nausea may be a side effect of the medication and will subside.   - The medications you received may make you dizzy and sleepy, it is important that you do not drive, operate machinery or drink alcohol for at least one day.   - Severe pain is not expected and should be reported    Other side effects may include:  Flex. Sig. Or Colonoscopy: A small amount of diarrhea may follow the exam.    If problems, call [facility] at [phone] during business hours Monday through Friday 7:30am to 4:30 pm.  After hours call [phone] and ask to speak to the GI Fellow on call.

## 2021-03-25 LAB — SURGICAL PATHOLOGY

## 2021-03-29 ENCOUNTER — Encounter: Payer: Self-pay | Admitting: GASTROENTEROLOGY

## 2021-03-29 DIAGNOSIS — Z1211 Encounter for screening for malignant neoplasm of colon: Secondary | ICD-10-CM | POA: Insufficient documentation

## 2021-03-29 NOTE — Progress Notes (Signed)
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 This is a benign polyp that has malignant potential so therefore follow up in the future is required. The suggested follow up is  repeat colonoscopy in 5 years.  The problem list was updated. The health maintenance was updated.

## 2021-04-27 ENCOUNTER — Ambulatory Visit: Payer: Medicare Other | Admitting: Family

## 2021-04-27 ENCOUNTER — Telehealth: Payer: Self-pay | Admitting: Family

## 2021-04-27 MED ORDER — AMPYRA 10 MG TABLET,EXTENDED RELEASE
10 | ORAL | 1 refills | Status: AC
Start: 2021-04-27 — End: ?

## 2021-04-27 NOTE — Telephone Encounter (Signed)
TC to patient to follow-up on scheduled video visit today.  VM left for him to sign on for visit or let us know if he needs to reschedule.  Burnadette Peter, MSN, FNP-C  Neurology

## 2021-04-30 ENCOUNTER — Ambulatory Visit: Payer: Medicare Other | Admitting: Family

## 2021-04-30 DIAGNOSIS — M24561 Contracture, right knee: Secondary | ICD-10-CM

## 2021-04-30 DIAGNOSIS — G35 Multiple sclerosis: Secondary | ICD-10-CM

## 2021-04-30 DIAGNOSIS — M24562 Contracture, left knee: Secondary | ICD-10-CM

## 2021-04-30 NOTE — Progress Notes (Signed)
[facility]  [address]  [place], [place]  [address]  Phone [phone] * Fax [phone]      PATIENT:  Roy Henry  MRN:         [id number]  DATE:        04/30/2021    I performed this clinical encounter by utilizing a real time telehealth video connection between my location and the patient's location. The patient's location was confirmed during this visit. I obtained verbal consent from the patient to perform this clinical encounter utilizing video and prepared the patient by answering any questions they had about the telehealth interaction.    Dear Colleague,   This is a pertinent summary of the present condition and overall health of Roy Henry, born April 25, 1971, obtained via this video visit.      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 Blenda Peals at [facility] and [facility].  He last received it at [facility] on 01/05/21 and 01/19/21. He tolerates rituximab well with no known side effects.  He was last seen by me on 02/02/2021 with the following plan:    #MS   - advanced, continue rituximab 1000 mg x 2 doses every 6 months, due next in 06/2021 at [facility]-orders are current.  - Continue Ampyra 10 mg bid for weakness in the arms and legs  - recommend a second COVID booster since he is immunosuppressed     #Spasticity / contractures.   - recommend increasing diazepam 10 mg four times per day (every 6 hours) for spasticity  - continue 5 mg tab diazepam up to 4 times per day as needed for spasticity, to relax muscles for a shower / bath, transfers or during therapy.  - continue baclofen 20 mg four times per day   - Referred to Dr. Roosevelt Locks at [facility] for Botox injections due to severe spasticity of LEs.    - continue passive ROM with PT and the CNAs at the SNF as well.      #SNF issues:  - wound care  -  stretching, strengthening, contracture prevention     Follow-up in 3 months with Dr. Lorenz Coaster.    Interval History:  He was getting better after his last visit at the [place] center.  He was able to sit upright.  A spot opened up in an assisted living facility in [place].  He moved to this facility on April 16, 2021.  He is due for Rituxan in December 2022.  There are no nurses or doctors there.  He has been laying flat on a bed all day in a room.  He is not on a hospital bed.  His caregivers want him to pay for his medications out of pocket.  He is crying from pain because he has ran out of diazepam and pain medication.  He was under the impression that he would be able to be mobile in his wheelchair.  He called [facility] to ask to go back.  The staff at the new facility tell him that he now has to pay out of pocket for medications, ambulance rides, and medical appointments.  He feels that he is declining rapidly.  His legs are contracting again.  The care workers  are very young and have difficulty turning him.  He does not want to call adult protective services about this facility.  He feels that they are doing their best to take care of him but that he needs more than they can provide.      His sister and daughter live in [place].  His dad thought Roy Henry was going to die so gave [place] belongings away and they do not have a relationship.  He doesn't have any family that lives close by.  His electric wheelchair is at his fathers house and he was told that he could sue his dad to give it back to him.      Current MS symptoms:  He has some fatigue.  He gets about 3 hours of sleep a night.  He uses caffeine/coffee for fatigue.  He denies cognitive problems.  He has good long term memory.  Denies vision problems other than reading glasses.  No speech or swallowing problems.  He has gained weight.  He has weakness of the right arm and both legs and is now losing  the grip in his left hand.  He has significant spasticity and contractures.  He is non-ambulatory.  He has urinary incontinence and has had past UTIs, none recent.  He has constipation and takes preventative medications as well as laxatives prn.  He is very depressed due to his current living situation.  He has no social interaction.  He tried to contact his ex girlfriend of 10 years but she has moved on and this is hard for him to deal with.  The care providers give him his medications.     He had a yeast infection in the groin recently that is now resolved.  No other infections reported.  He has been vaccinated with 3 COVID vaccines.    He had a hernia repair surgery on 09/21/20.     He feels that the Ampyra helps with the tightening of the right hand.    The legs are contracted and he hopes to get improvement in mobility (previously had benefit from the Botox at [facility]).  He was seen in PM&R for baclofen pump placement.  He did the trial but had an allergic reaction after the instillation so will not be proceeding with the baclofen pump.  He has an appointment with Dr. Roosevelt Locks on 05/12/21 for evaluation for Botox.    Last MRI done in 2017. Might need large bore or open MRI due to leg contractures.     He had COVID in 07/2019 (Omicron), mild case.     MS History:  The onset of symptoms was when he was 35 years ago in 2007 with symptoms of lower extremity weakness, progressing to inability to bear weight. Roy Henry was previously on copaxone, tysabri and rituximab treatments in the past, all of which have worked well in the past, according to him. He also received multiple botox injections for his contractures before COVID, and that, according to patient, has helped with his symptoms as well. Ampyra was helpful in the past; however, was accidentaly stopped during his transfer from one acute rehab to another as pt believes they lost some records during the transfer. He restarted rituximab by Dr. Blenda Peals in 06/2020, 1000 mg  every 6 months. Prior to that infusion, the other infusion was over two years prior. Roy Henry said that because of COVID-19 he had difficulty organizing transportation to get infusion at the center. When Roy Henry was on regular infusions, he  reported to not have any relapses at that time.     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    Incisional hernia, without obstruction or gangrene 09/21/2020     Past Medical History:   Diagnosis Date    GERD (gastroesophageal reflux disease)     Hypertension     Immobility     bed/wheelchair bound    Multiple sclerosis (HCC)      Allergies:    Tegretol [Carbamazepine]    Hives    Medications:     Current Outpatient Medications:     Acetaminophen (TYLENOL) 500 mg Tablet, Take 2 tablets by mouth every 6 hours., Disp: 100 tablet, Rfl: 0    Amlodipine (NORVASC) 10 mg Tablet, Take 10 mg by mouth every day., Disp: , Rfl:     Aspirin 81 mg EC Tablet, Take 1 tablet by mouth every morning., Disp: , Rfl:     Baclofen (LIORESAL) 10 mg Tablet, Take 2 tablets by mouth 4 times daily., Disp: , Rfl:     Dalfampridine (AMPYRA) 10 mg ER Tablet, Take 1 tablet by mouth 2 times daily. Needs brand name, the generic didn't work for him., Disp: 60 tablet, Rfl: 11    Diazepam (VALIUM) 5 mg Tablet, Take 1 tablet by mouth every 6 hours if needed (spasticity)., Disp: 30 tablet, Rfl:     Diazepam (VALIUM) 5 mg Tablet, Take 2 tablets by mouth every 6 hours. Take around the clock, not as needed. See separate rx for ADDITIONAL doses as needed., Disp: 60 tablet, Rfl: 5    Docusate (COLACE) 100 mg Capsule, Take 1 capsule by mouth 2 times daily., Disp: 60 capsule, Rfl: 0    Gabapentin (NEURONTIN) 100 mg Capsule, Take 1 capsule by mouth 3 times daily., Disp:  90 capsule, Rfl: 0    Hydrocodone-Acetaminophen (NORCO) 5-325 mg Tablet, Take 1 tablet by mouth., Disp: , Rfl:     Lidocaine (LIDODERM) 5 %(700 mg/patch) Patch, Apply 1 patch to the skin every 24 hours. Apply to painful area (12 hours on, 12 hours off), Disp: 30 patch, Rfl: 0    Magnesium Hydroxide (MILK OF MAGNESIA) 400 mg/5 mL Liquid, Take 30 mL by mouth once daily if needed., Disp: 900 mL, Rfl: 0    Naloxone (NARCAN) 4 mg/actuation Nasal Spray, Use for opioid overdose. Give 1 spray in nostril. If no response / breathing slows down, repeat every 2 minutes until emergency help arrives., Disp: 2 each, Rfl: 0    onabotulinumtoxinA (BOTOX INJ), 0 Refill(s), Maintenance, Disp: , Rfl:     Pantoprazole (PROTONIX) 40 mg Delayed Release Tablet, Take 40 mg by mouth., Disp: , Rfl:     Polyethylene Glycol 3350 (MIRALAX) 17 gram Powder, Take 1 packet by mouth every morning. Mix in 4 to 8 oz of water, soda, coffee, juice or tea., Disp: 30 packet, Rfl: 0    RiTUXimab (RITUXAN) - Infusion Med Placeholder, This is a placeholder order to keep the medication list accurate. See therapy plan for details., Disp: , Rfl:     Sennosides (SENOKOT) 8.6 mg Tablet, Take 2 tablets by mouth every day at bedtime., Disp: 60 tablet, Rfl: 0    Tizanidine (ZANAFLEX) 2 mg capsule, See Instructions, 1 CAP AT HS  X 3 DAYS, THEN 1 CAP BID X 3 DAYS AND THEN 1 CAP TID THEREAFTER., # 90 Cap, 3 Refill(s), Maintenance, Pharmacy: [facility], 185.42, cm, 05/10/19 13:58:00 PDT, Height/Length (cm), 70.31, kg, 06/24/19 16:33:00 PST, Dose..., Disp: , Rfl:     VITAMIN D PO, Take 1,000 Units by mouth every morning., Disp: , Rfl:     Social history:  He lives in a board and care facility in [place].  He reports smoking in the past, but stopped at the age of 89. No current alcohol or other recreational drug use.   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    Family history:   No family history of multiple sclerosis in family members. Young sister  diagnosed with lupus at the age of 34.     EXAMINATION:  Neuro Exam:    Mental Status: Alert and oriented  State: Calm  MMSE:  Not performed  Use of language:  Fluent, normal    He is laying down on a flat bed.  I am only able to see his face.    Labs/Imaging:   Normal CBC and CMP 09/29/20 except mild anemia.     [facility] labs from 03/2016:  Hep B sAb negative  Hep B sAg negative  Hep B cAb negative  Hep C negative  HIV negative    Labs done at [facility] on 01/19/21:  Hepatitis B and C serologies normal  Quantiferon gold TB neg  IgG and IgM normal  CMP normal  CD19 <1  CBC  unremarkable    Assessment / Plan:  In summary, Roy Henry is a 50yr old male with a previous diagnosis of multiple sclerosis, advanced, now on rituximab. Severe LE contractures, spasticity, pain. RUE weakness that has improved recently since restarting rituximab, possible early contractures at elbow and shoulder. LUE with weakness that is worse since surgery. His contractures are severe, painful and prevent mobility because he is limited to laying down on a gurney.  He requires the level of care provided at a SNF.  I sent a message to Olivia Canter, LCSW to assist with getting him back to a SNF in [facility] and for medical transportation to medical appointments that are covered by his insurance.    #MS   - advanced, continue rituximab 1000 mg x 2 doses every 6 months, due next in 06/2021 at [facility]-orders are current.  - Continue Ampyra 10 mg bid for weakness in the arms and legs  - recommend a second COVID booster since he is immunosuppressed    #Spasticity / contractures.   - recommend increasing diazepam 10 mg four times per day (every 6 hours) for spasticity  - continue 5 mg tab diazepam up to 4 times per day as needed for spasticity, to relax muscles for a shower / bath, transfers or during therapy.  - continue baclofen 20 mg four times per day   - Referred to Dr. Roosevelt Locks at [facility] for Botox injections due to severe spasticity of  LEs.    - continue passive ROM with PT and the CNAs at the SNF as well.     #SNF issues:  - wound care  - stretching, strengthening, contracture prevention  - PT and OT    Follow-up in 1 month to coordinate rituximab infusion.    Discussed case with Dr. Lorenz Coaster who agrees with this plan of care.    Total time I spent in care of  this patient today (excluding time spent on other billable services) was 60 minutes.      Sincerely,    Burnadette Peter, MSN, FNP-C  Neurology

## 2021-04-30 NOTE — Patient Instructions (Signed)
Thank you for visiting the Clinical Neurosciences Department at the [facility] today.    The neurologist(s) you saw today was(were):   Nurse Practitioner: Burnadette Peter NP     Recommendations made during today's visit include:   Call us back with your local pharmacy name and address.   Follow-up with me the first week of December.    The following diagnostic tests have been ordered to help [place] better determine the underlying cause of your condition as well as guide best treatment. :    None.  Plan to repeat labs prior to next Rituxan in December.  Will order Rituxan when we know where you will be living in December.    The following changes to your medication regimen have been made:    None    The following referrals have been generated through the [facility] system:   Social worker to assist with moving to another facility, transportation, and medications.      An open line of communication between you, your family, and [place] (your neurology team) is important to [place], both for safety and maintaining the highest quality of patient care.  --------------------------------------------------------------------------------------------------------    If you have any questions, please feel free to ask [place]!    1. By telephone.  You can reach your resident neurologist by calling the [place] neurology clinic at [phone].    2. Online. We also encourage you to register for [employer]," which allows you online computer access to your medical chart, as well as an opportunity to e-mail your doctor regarding non-urgent issues.  You can sign up by visiting the following link: "[email or web address]".    Thank you for choosing the Neurology department at the [facility].  It was a pleasure to participate in your care today    Sincerely,     Burnadette Peter, NP-BC

## 2021-05-03 ENCOUNTER — Other Ambulatory Visit: Payer: Self-pay | Admitting: Family

## 2021-05-03 ENCOUNTER — Telehealth: Payer: Self-pay | Admitting: Social Worker

## 2021-05-03 ENCOUNTER — Encounter: Payer: Self-pay | Admitting: Family

## 2021-05-03 ENCOUNTER — Ambulatory Visit: Payer: Medicare Other | Admitting: Rehabilitative and Restorative Service Providers"

## 2021-05-03 DIAGNOSIS — G35 Multiple sclerosis: Secondary | ICD-10-CM

## 2021-05-03 NOTE — Progress Notes (Deleted)
Physical Medicine and Rehabilitation Clinic VIDEO VISIT Follow-Up Note    I performed this clinical encounter by utilizing a real time telehealth video connection between my location and the patient's location. The patient's location was confirmed during this visit. I obtained verbal consent from the patient to perform this clinical encounter utilizing video and prepared the patient by answering any questions they had about the telehealth interaction.    Date of visit: 05/03/2021    Patient seen today in PM&R for initial consultation at the request of Dr. Reymundo Poll, MD for evaluation of patient has severe bilateral lower extremity spasticity.    History he was obtained from the medical record, and the patient.    No chief complaint on file.  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  The patient states that he was living with the assistance of an attendant through in-home support services in the [facility] area until approximately a year ago, and has lived in a skilled nursing facility ever since.  He states he is not getting much therapy at a skilled nursing facility but has been getting range of motion from the aids there to help stretch his lower extremities.  He remains relatively dependent for his bed mobility, transfers, toileting, etc.  He is on oral baclofen for his spasticity, however his spasticity remains severe.  He is being sent here for evaluation for possible baclofen pump placement.   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  The patient subsequently developed an incisional hernia.  The patient also was found to have a right femoral artery thrombosis and a  symptomatic right inguinal hernia.  The patient subsequently underwent on 09/21/2020 open ventral hernia repair with mesh and possible abdominal wall reconstruction using transverse abdominal muscle release and possible right inguinal hernia repair with mesh.    INTERVAL HISTORY:  Since last consultation on 11/30/2020: The patient states that he is having about 50% improvement with regards to his spasms and spasticity.  He states now that the CNA's are able to stretch his legs a little bit further, and that he is able to have more range in his adductors in his legs making it easier for hygiene.  He states that he is having discomfort and pain in his tendons with the stretching and has been utilizing Norco to assist with this.  He remains on the tizanidine form milligrams every 8 hours.  I have discussed the case with neurosurgery, Dr. Milus Henry, who wishes to see the patient, evaluate his abdomen, to see whether or not he would be a candidate for baclofen pump, if oral medications are not effective for him.  I have sent a referral, pending appointment.    Procedure Performed/Description:  Procedure(s):  REPAIR, HERNIA, INCISIONAL (N/A)  open Rives stoppa repair, CPT [phone]  INSERTION, MESH, ABDOMINAL WALL, ANTERIOR (N/A)  CPT [phone]  REPAIR, HERNIA, INGUINAL, UNILATERAL (Right)  COMPONENT SEPARATION, ABDOMINAL WALL (Bilateral) transversus abdominis release, CPT 15734 x2    PREVIOUS DIAGNOSTIC STUDIES:  The lab studies, imaging results, and the actual images of the studies below were personally reviewed with the patient:  Images:  09/25/2020: Abdominal x-ray: These films were personally reviewed by Dr Roy Henry who agrees  with following impression:  Non-obstructive bowel gas pattern. Surgical staples to the left of midline.  Surgical drains project over the abdomen. Unchanged osseous structures.   IMPRESSION:  1. Nonobstructive bowel gas pattern.       Summary as in HPI  Labs:  Labs were personally reviewed by Dr.  Franchot Henry  Results for Roy Henry (MRN [id number]) as of 12/01/2020 16:03   Ref. Range 10/06/2020 14:50   Sodium Latest Ref Range: 136 - 145 mmol/L 141   Potassium Latest Ref Range: 3.4 - 5.1 mmol/L 4.3   Chloride Latest Ref Range: 98 - 107 mmol/L 106   Carbon Dioxide Total Latest Ref Range: 22 - 29 mmol/L 24   Urea Nitrogen, Blood (BUN) Latest Ref Range: 6 - 20 mg/dL 16   Creatinine Blood Latest Ref Range: 0.51 - 1.17 mg/dL 0.55   Glucose Latest Ref Range: 74 - 109 mg/dL 88   Calcium Latest Ref Range: 8.6 - 10.0 mg/dL 8.9   Protein Latest Ref Range: 6.6 - 8.7 g/dL 5.9 (L)   Albumin Latest Ref Range: 4.0 - 4.9 g/dL 3.6 (L)   Alkaline Phosphatase (ALP) Latest Ref Range: 35 - 129 U/L 64   Aspartate Transaminase (AST) Latest Ref Range: <=41 U/L 9   Bilirubin Total Latest Ref Range: <=1.2 mg/dL 0.1   Alanine Transferase (ALT) Latest Ref Range: <=41 U/L 6   Results for Roy Henry (MRN [id number]) as of 12/01/2020 16:03   Ref. Range 10/06/2020 14:50   White Blood Cell Count Latest Ref Range: 4.5 - 11.0 K/MM3 3.9 (L)   Red Blood Cell Count Latest Ref Range: 4.50 - 5.90 M/MM3 3.63 (L)   Hemoglobin Latest Ref Range: 13.5 - 17.5 g/dL 10.6 (L)   Hematocrit Latest Ref Range: 41.0 - 53.0 % 32.2 (L)   MCV Latest Ref Range: 80.0 - 100.0 fL 88.5   MCH Latest Ref Range: 27.0 - 33.0 pg 29.2   MCHC Latest Ref Range: 32.0 - 36.0 % 33.0   RDW Latest Ref Range: 0.0 - 14.7 % 13.4   Platelet Count Latest Ref Range: 130 - 400 K/MM3 371     CURRENT MEDICATIONS:   No outpatient medications have been marked as taking for the 05/03/21 encounter (Appointment) with Rolla Flatten, MD.     ALLERGIES:  Allergies   Allergen Reactions    Tegretol [Carbamazepine] Hives     Function:  Mobility  dependent for bed mobility and transfers   ADLs  dependent   Thx  currently not receiving therapies   DME/Orthotics  at a skilled nursing facility.  Today he arrives on a gurney     REVIEW OF SYSTEMS:   CONSTITUTIONAL: Denies fever or chills  CARDIAC:  Denies chest pain or palpitations  PULMONARY: Denies shortness of breath or cough  MUSCULOSKELETAL: Has pain in his tendons with his stretching  NEUROLOGIC: States spasticity is about 50% less spasms per hour.  SKIN: Denies any current pressure sores    PHYSICAL EXAMINATION:  The patient remains at his facility, he is accompanied today by Anderson Malta his nurse.  BP: 108/76, HR 97  Temp: --  Temp src: --  Pulse: --  BP: --  Resp: --  SpO2: --  Height: --  Weight: --    Constitutional: Pleasant, cooperative, answering questions appropriately.  Psych: Appropriate affect, pleasant.   Eyes:  No dysconjugate gaze  ENT:  Hearing grossly intact    From previous examination 11/30/2020:  Cardiovascular: Heart is regular  rate and rhythm.  Respiratory: Lungs are clear, no rales, ronchi, or wheezes  GI:  Soft, nontender, positive bowel sounds.  SKIN: No evidence of rash or skin breakdown on exposed skin.  Musculoskeletal:  On inspection: Patient with severe flexion and internal rotation spasticity and contractures at the hips and knees.  On ROM: Severe contractures at the knees and hips.  Neurologic:  On manual muscle testing: Left upper extremity strength testing with grip, wrist extension, elbow flexion, and elbow extension as well as shoulder flexion is approximately 4/5.  Right upper extremity strength testing has some extensor tone noted with 1/5 proximal muscle testing and 0/5 distally.  Strength testing was difficult in bilateral lower extremities however I was not able to see any significant voluntary muscle strength in bilateral lower extremities.  Cognition: Appears to be answering questions appropriately.  Knows his medical history relatively well.    IMPRESSION:   Roy Henry is a 50yr -old man with:  No diagnosis found.    RECOMMENDATIONS:  Discussion - The above was discussed with the patient at today's visit.  The diagnoses, workup, treatment options, risks, and benefits of each were explained in detail, and the  patient expressed sound understanding.  Per that discussion, the following consensus plan was reached:    #Spasticity: The patient has severe spasticity in bilateral lower extremities, has failed 80 mg of baclofen a day orally along with Valium, and has such severe spasticity that other oral medications may not be that beneficial, however I would like to continue to maximize the tizanidine, and consider utilization of Dantrium if necessary to maximize oral antispasmodic agents.    -Increase tizanidine to 6 mg p.o. every 8 hours monitoring of blood pressure to make sure that he is not getting any significant hypotension.  -Repeat video visit in approximately 3-4 weeks.  The tizanidine can be gradually increased for maximum dose of 36 mg a day.    -Referral to Dr. Milus Henry in neurosurgery previously placed to evaluate the patient's abdominal wall region to see if he feels that a pump could be placed from a neurosurgical standpoint.  If he feels that it can be placed, we will then set the patient up for a baclofen pump trial in order to see if he would benefit from intrathecal baclofen.  If it pump cannot be placed, then the patient may benefit from other neurosurgical intervention such as possible dorsal rhizotomy to assist with spasticity.    Follow up -video visit in approximately 2 weeks to assess tizanidine, and possibly increase dosage.    I spent a total of 40 minutes today on this patient's visit. This included 30 minutes of face to face time, more than 50% of which was spent in counseling or coordinating care, for the above-stated medical problems. The remainder of the time was spent on review of the patient record (including but not limited to clinical notes, outside records, laboratory and radiographic studies), medical decision-making, and documentation of the visit.    PM&R Staff Note  Electronically signed by:    Nathaniel Man, M.D.  [phone]  PM&R Attending Physician  [phone]

## 2021-05-03 NOTE — Progress Notes (Signed)
TC to Olivia Canter, LCSW to discuss placement and whether he needs APS called.  Apparently, patient had arranged his placement from [place] to a B&C located at [address], [place], [place] [address], [phone], which is operated by [facility] with an Curator, RJ Henry-Seville [phone]).   RJ gave Malachy Mood another name and number to call, Jackelyn Knife [phone].  I called this number and there was no answer and the voicemail was full.  The patient has advanced MS with severe disability and requires a higher level of care than what a board and care can provide.  There is no abuse or neglect by the facility but they cannot provide him the care that he needs.  I placed an order for direct referral to a SNF for long term care in EMR to work on placement in [place] closer to his physicians.  I discussed this case with Dr. Lorenz Coaster who agrees with the plan to transfer him to a SNF. I called the patient to let him know of the progress for the plan to move to a SNF.  He states that he does wish to go to a SNF in [place].  He wants to go back to [facility] but they cannot accept him currently per [place].  He states that the issue of paying for his medications has been figured out by the staff at the current facility now that they found his paperwork with his insurance information to provide to the pharmacy.  Malachy Mood will be looking into medical transportation options that would be covered by his insurance.  Call placed to [employer], [place] (SNF Program) to discuss case.  Burnadette Peter, MSN, FNP-C  Neurology

## 2021-05-03 NOTE — Telephone Encounter (Signed)
04/30/21:  LCSW received a Staff Message from NP Butters requesting clinical social work outreach services to assist the 50 year old male pt, with a history of multiple sclerosis, with long-term placement needs in the [place] area, transportation support, and linkage to a PCP.  NP Butters explained the pt currently resides in a facility in [place], [place], but was not receiving the necessary therapies/care he needed.  She denied the pt was abused/neglected in the facility, but noted the needed to return to [facility] to be closer to his providers, receive therapies, and the attention for 24/7 caregivers.        LCSW learned the following information:   Pt was discharged from [facility] on 04/16/21 to a B&C located at [address] [place], [place] [address], [phone], which is operated by [facility] with an Curator, RJ Henry-Seville [phone]).  [facility] declined to have the pt return to its facility - no male beds available and too difficult to discharge.  LCSW received a return call from [place] who claimed he was only the "house manager" and provided the actual administer's name/contact number:  Jackelyn Knife, [phone].      LCSW informed NP Michael Litter it would be very challenging to reach out to the numerous SNFs (approximately 77) in [facility] to inquire if they have a male bed available for the pt.  As for transportation support to [facility] for treatment, LCSW contacted [facility] to request non-emergency medical transportation information for contracted companies in [place].      LCSW sought assistance from Patient Navigator (TOC), [employer], regarding linkage to available SNFs.  C[place] sent the following instructions for assistance:     We will need the following entered in EPIC:   - SNF direct admit referral (initially).  This will specify the type of SNF placement (i.e., long term, short term with dispo, short term w/out confirmed needing  B&C/R&B placement) & provide details on what services specifically are being requested (i.e. SN, PT, OT, etc)  - SNF order (will be needed if we can secure an accepting SNF)  - COVID test (within 72hrs of SNF placement)  - Client will need transportation to accepting SNF    The [facility] Coordinator will receive the SNF direct admit referral via work queue overseen by SNF [facility] coordinators.   From there, we will reach out to our SNF collab partners for review.    LCSW forwarded the message to NP [facility] for review.    05/03/21:  LCSW spoke to NP Butters, via telephone, about the case and collaborative care to ensure the pt was supported in returning to [facility] for continued treatment and therapies.  NP Butters indicated she received the abovementioned message and would initiate the SNF direct admit referral.  She further stated she would contact the pt's current B&C to discuss     LCSW received the following information from [facility] about contracted transportation companies in [place]:      [employer]. [phone]            [facility] [phone]         [employer] [phone]    LCSW provided the transportation information to NP Butters and the pt, via [place], for review and to contact for his 05/12/21 appt with Dr. Roosevelt Locks.      PLAN:  LCSW will continue to collaborate with NP Butters throughout the pt's placement needs.

## 2021-05-11 ENCOUNTER — Telehealth: Payer: Self-pay | Admitting: Social Worker

## 2021-05-11 NOTE — Telephone Encounter (Signed)
05/07/21:  LCSW received several voicemail messages from the 51 year old male pt regarding his current placement in [facility], request to return to a long-term care facility in [place], and transportation needs.      Pt reported, in his message, the three transportation agencies ([place], political party, and [employer]) were unable to assist him at this time since two required at least 3 weeks of processing time and specific paperwork from a provider, and the other did not offer gurney transportation.  Pt stressed the importance of his return to a care facility in [facility] and mentioned he contacted [employer] to inquire about linkage to a PCP - stated he needed to return to [facility] and would be given a list of available providers at that time.      05/11/21:  LCSW spoke to the pt and informed him the [facility] Coordinator was assisting with locating an accepting long-term care facility in [facility] as of 04/30/21.  Pt provided additional information related to his move from [place] to his current placement through the Assisted Living Waiver (ALW).  He stated, "I was signed up for this program.  They misled me."  He reported he was told that "no one" liked him at [facility] despite his long-stay at the facility, and he felt heartbroken.  Pt disclosed he cried when he heard that information and stated he was told he would get a lot more independence in his current placement.  However, pt denied that was true and commented, "They weren't very clear" about the ALF and financial obligation ($1,200 per month and limited experience from caregivers related to his specific care needs) the placement required.  Pt denied he was being abused and/or neglected in his current placement, but stated the caregivers were inexperienced and did not move him often.  LCSW reiterated the [facility] Coordinator was involved  and working on his case.  Pt thanked this LCSW.    PLAN:  -Collaborate with NP Butters to request updated information from [place] about status of pt's transfer back to [facility] to an accepting long-term care facility.  NP Butters sent email to [facility] Coordinator for updates.    -Maintain contact with pt

## 2021-05-12 ENCOUNTER — Ambulatory Visit: Payer: Medicare Other | Admitting: Neurology

## 2021-05-12 ENCOUNTER — Ambulatory Visit: Payer: Medicare Other | Admitting: Rehabilitative and Restorative Service Providers"

## 2021-05-13 ENCOUNTER — Other Ambulatory Visit: Payer: Self-pay | Admitting: Family

## 2021-05-13 ENCOUNTER — Telehealth: Payer: Self-pay | Admitting: Family

## 2021-05-13 MED ORDER — DOCUSATE SODIUM 100 MG CAPSULE
100.0000 mg | ORAL_CAPSULE | Freq: Two times a day (BID) | ORAL | 0 refills | Status: DC
Start: 2021-05-13 — End: 2021-06-07

## 2021-05-13 MED ORDER — DIAZEPAM 5 MG TABLET: 5.0000 mg | ORAL_TABLET | Freq: Four times a day (QID) | ORAL | 3 refills | Status: DC | PRN

## 2021-05-13 NOTE — Telephone Encounter (Signed)
TC to patient in response to his My Chart message.  VM left that we are working on finding him placement.  When I last spoke to the patient he said that the prescription problem was resolved.  VM left for him to leave [place] the new pharmacy information so that I can call them to see what the problem is.  I received an email from [employer] who is working on trying to find a SNF to accept him and they have not been able to find one yet but had a couple of other options.    Burnadette Peter, MSN, FNP-C  Neurology

## 2021-05-13 NOTE — Progress Notes (Signed)
TC to patients new pharmacy, [facility] [place], [place], [place] [address] the phone number is [phone] to determine what prescriptions have been ordered.  He was dispensed baclofen on 05/01/21.  The following medications were on hold:  Lactulose, tizanidine (not covered by his insurance), gabapentin, and amlodipine.  The pharmacy ran the gabapentin and amlodipine today and they are covered and can now be picked up.  I refilled the valium 5 mg every 6 hours prn spasticity and docusate to prevent constipation.  Message sent to Kinnie Feil, pharmacist to follow-up on the Ampyra order.  I will follow-up with Dalphine Handing tomorrow about SNF placement.  Burnadette Peter, MSN, FNP-C  Neurology

## 2021-05-14 ENCOUNTER — Other Ambulatory Visit: Payer: Self-pay

## 2021-05-20 NOTE — Telephone Encounter (Signed)
Can the PCP help?

## 2021-05-27 ENCOUNTER — Encounter: Payer: Self-pay | Admitting: Family

## 2021-05-27 NOTE — Telephone Encounter (Signed)
From: [name]  To: Lorne Skeens, NP  Sent: 05/27/2021 12:19 PM PST  Subject: Checking status    Checking status I was wondering if I could have an update on how things were going with Relocating me?

## 2021-06-02 ENCOUNTER — Encounter: Payer: Self-pay | Admitting: Family

## 2021-06-06 ENCOUNTER — Other Ambulatory Visit: Payer: Self-pay | Admitting: Family

## 2021-06-18 NOTE — Telephone Encounter (Signed)
We can't directly admit from home since there are no acute neurological issues, but he can go through ER to address the pain and spasms.

## 2021-06-21 ENCOUNTER — Telehealth: Payer: Self-pay | Admitting: Family

## 2021-06-21 NOTE — Telephone Encounter (Signed)
Dr. Lorenz Coaster suggested he go to the ER to get his concerns addressed immediately - hopefully he will be admitted and then get discharge planners involved.  Not sure it will work, but if he needs medical attention, it's an option.

## 2021-06-21 NOTE — Telephone Encounter (Signed)
TC t

## 2021-06-21 NOTE — Telephone Encounter (Signed)
TC to patient in response to his My Chart message.  He did not go the local ED last week because transportation costs of $1900.  He states that he is getting depressed because of his current living situation.  He has not been able to get a local PCP because he is planning on moving.  He hasn't seen his last PCP, Dr. Ardeen Fillers for 6 months and he states that she hasn't returned his calls.  I advised him that we are still working on getting him placed and that I will check on it today and update him tomorrow at our scheduled visit.  Will send message to the team working on placement to check status.  I advised him to try calling Dr. Ardeen Fillers again regarding continuing his medications that are not ordered by Neurology.  He verbalized understanding and agreement with plan.  Burnadette Peter, MSN, FNP-C  Neurology

## 2021-06-22 ENCOUNTER — Ambulatory Visit: Payer: Medicare Other | Admitting: Family

## 2021-06-22 DIAGNOSIS — M24561 Contracture, right knee: Secondary | ICD-10-CM

## 2021-06-22 DIAGNOSIS — Z5181 Encounter for therapeutic drug level monitoring: Secondary | ICD-10-CM

## 2021-06-22 DIAGNOSIS — G35 Multiple sclerosis: Secondary | ICD-10-CM

## 2021-06-22 DIAGNOSIS — M24562 Contracture, left knee: Secondary | ICD-10-CM

## 2021-06-22 DIAGNOSIS — R252 Cramp and spasm: Secondary | ICD-10-CM

## 2021-06-22 NOTE — Progress Notes (Signed)
[facility]  [address]  [place], [place]  [address]  Phone [phone] * Fax [phone]      PATIENT:  Roy Henry  MRN:         [id number]  DATE:        06/22/2021    I performed this clinical encounter by utilizing a real time telehealth video connection between my location and the patient's location. The patient's location was confirmed during this visit. I obtained verbal consent from the patient to perform this clinical encounter utilizing video and prepared the patient by answering any questions they had about the telehealth interaction.    Dear Colleague,   This is a pertinent summary of the present condition and overall health of Roy Henry, born 11/05/1970, obtained via this video visit.      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 Blenda Peals at [facility] and [facility] in 07[facility]07/2020.  He last received it at [facility] on 01/05/21 and 01/19/21. He tolerates rituximab well with no known side effects.  He was last seen by me on 04/30/2021 with the following plan:    ##MS   - advanced, continue rituximab 1000 mg x 2 doses every 6 months, due next in 06/2021 at [facility] [place]-orders are current.  - Continue Ampyra 10 mg bid for weakness in the arms and legs  - recommend a second COVID booster since he is immunosuppressed     #Spasticity / contractures.   - recommend increasing diazepam 10 mg four times per day (every 6 hours) for spasticity  - continue 5 mg tab diazepam up to 4 times per day as needed for spasticity, to relax muscles for a shower / bath, transfers or during therapy.  - continue baclofen 20 mg four times per day   - Referred to Dr. Roosevelt Locks at [facility] for Botox injections due to severe spasticity of LEs.    - continue passive ROM with PT and the CNAs at the SNF as well.      #SNF issues:  - wound care  -  stretching, strengthening, contracture prevention  - PT and OT     Follow-up in 1 month to coordinate rituximab infusion.     He was getting better after his last visit at the [place] center.  He was able to sit upright.  A spot opened up in an assisted living facility in [place].  He moved to this facility on April 16, 2021.  He is due for Rituxan in December 2022.  There are no nurses or doctors there.  He has been laying flat on a bed all day in a room.  He is not on a hospital bed.  His caregivers want him to pay for his medications out of pocket.  He is crying from pain because he has ran out of diazepam and pain medication.  He was under the impression that he would be able to be mobile in his wheelchair.  [facility] to ask to go back.  The staff at the new facility tell him that he now has to pay out of pocket for medications, ambulance rides, and medical appointments.  He feels that he is declining rapidly.  His legs are contracting again.  The care workers are very young and have difficulty turning him.  He does not want to call adult protective services about this facility.  He feels that they are doing their best to take care of him but that he needs more than they can provide.      His sister and daughter live in [place].  His dad thought Lorenzo was going to die so gave [place] belongings away and they do not have a relationship.  He doesn't have any family that lives close by.  His electric wheelchair is at his fathers house and he was told that he could sue his dad to give it back to him.      Interval History:  He has not been able to get transferred to a SNF due to the medication.  He now has a hospital bed at there board and care facility.  He is due for rituximab in December 2022 but is unable to get to the infusion center due to transportation.  We discussed the possibility of getting the rituximab at the [facility].  He denies  side effects from the rituximab.  Currently, he is unable to get transportation to [facility] to receive the rituximab.  He was transferred to a board and care facility in [place] on 04/16/21.  I last saw him on 04/30/21 and have been trying to move him to a local SNF since then.  Unfortunately, [place] staff have been unable to find a SNF able to accept him.  He was advised to go to the local ER by Dr. Lorenz Coaster but he couldn't afford the nearly $1000 cost for transportation.      Current MS symptoms:  His MS symptoms are about the same.  He has some fatigue.  He gets about 3 hours of sleep a night.  He uses caffeine/coffee for fatigue.  He denies cognitive problems.  He has good long term memory.  Denies vision problems other than reading glasses.  No speech or swallowing problems.  He has gained weight.  He has weakness of the right arm and both legs and is now losing the grip in his left hand.  He has significant spasticity and contractures.  He is non-ambulatory.  He has urinary incontinence and has had past UTIs, none recent.  He has constipation and takes preventative medications as well as laxatives prn.  He is very depressed due to his current living situation.  He has no social interaction.  He tried to contact his ex girlfriend of 10 years but she has moved on and this is hard for him to deal with.  The care providers give him his medications.     He had a yeast infection in the groin recently that is now resolved.  No other infections reported.  He has been vaccinated with 3 COVID vaccines.    He had a hernia repair surgery on 09/21/20.     He feels that the Ampyra helps with the tightening of the right hand.    The legs are contracted and he hopes to get improvement in mobility (previously had benefit from the Botox at [facility]).  He was seen in PM&R for baclofen pump placement.  He did the trial but had an allergic reaction after the instillation so will not be proceeding with the baclofen pump.  He has an  appointment with Dr. Roosevelt Locks on 05/12/21 for evaluation for Botox.    Last MRI done in 2017. Might need large bore or open  MRI due to leg contractures.     He had COVID in 07/2019 (Omicron), mild case.     MS History:  The onset of symptoms was when he was 35 years ago in 2007 with symptoms of lower extremity weakness, progressing to inability to bear weight. Mr. Glantz was previously on copaxone, tysabri and rituximab treatments in the past, all of which have worked well in the past, according to him. He also received multiple botox injections for his contractures before COVID, and that, according to patient, has helped with his symptoms as well. Ampyra was helpful in the past; however, was accidentaly stopped during his transfer from one acute rehab to another as pt believes they lost some records during the transfer. He restarted rituximab by Dr. Blenda Peals in 06/2020, 1000 mg every 6 months. Prior to that infusion, the other infusion was over two years prior. Mr. Mccue said that because of COVID-19 he had difficulty organizing transportation to get infusion at the center. When Mr. Adamec was on regular infusions, he reported to not have any relapses at that time.     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    Incisional hernia, without obstruction or gangrene 09/21/2020     Past Medical History:   Diagnosis Date    GERD (gastroesophageal reflux disease)     Hypertension     Immobility     bed/wheelchair bound    Multiple sclerosis (HCC)      Allergies:    Tegretol [Carbamazepine]    Hives    Medications:     Current Outpatient Medications:     Acetaminophen (TYLENOL) 500 mg Tablet, Take 2 tablets by mouth every 6 hours., Disp: 100 tablet, Rfl: 0    Amlodipine  (NORVASC) 10 mg Tablet, Take 10 mg by mouth every day., Disp: , Rfl:     Aspirin 81 mg EC Tablet, Take 1 tablet by mouth every morning., Disp: , Rfl:     Baclofen (LIORESAL) 10 mg Tablet, Take 2 tablets by mouth 4 times daily., Disp: , Rfl:     Dalfampridine (AMPYRA) 10 mg ER Tablet, Take 1 tablet by mouth 2 times daily. Needs brand name, the generic didn't work for him., Disp: 60 tablet, Rfl: 11    Diazepam (VALIUM) 5 mg Tablet, Take 2 tablets by mouth every 6 hours. Take around the clock, not as needed. See separate rx for ADDITIONAL doses as needed., Disp: 60 tablet, Rfl: 5    Diazepam (VALIUM) 5 mg Tablet, Take 1 tablet by mouth every 6 hours if needed (spasticity)., Disp: 30 tablet, Rfl: 3    Docusate (COLACE) 100 mg Capsule, Take 1 capsule by mouth 2 times daily., Disp: 60 capsule, Rfl: 0    Gabapentin (NEURONTIN) 100 mg Capsule, Take 1 capsule by mouth 3 times daily., Disp: 90 capsule, Rfl: 0    Hydrocodone-Acetaminophen (NORCO) 5-325 mg Tablet, Take 1 tablet by mouth., Disp: , Rfl:     Lidocaine (LIDODERM) 5 %(700 mg/patch) Patch, Apply 1 patch to the skin every 24 hours. Apply to painful area (12 hours on, 12 hours off), Disp: 30 patch, Rfl: 0    Magnesium Hydroxide (MILK OF MAGNESIA) 400 mg/5 mL Liquid, Take 30 mL by mouth once daily  if needed., Disp: 900 mL, Rfl: 0    Naloxone (NARCAN) 4 mg/actuation Nasal Spray, Use for opioid overdose. Give 1 spray in nostril. If no response / breathing slows down, repeat every 2 minutes until emergency help arrives., Disp: 2 each, Rfl: 0    onabotulinumtoxinA (BOTOX INJ), 0 Refill(s), Maintenance, Disp: , Rfl:     Pantoprazole (PROTONIX) 40 mg Delayed Release Tablet, Take 40 mg by mouth., Disp: , Rfl:     Polyethylene Glycol 3350 (MIRALAX) 17 gram Powder, Take 1 packet by mouth every morning. Mix in 4 to 8 oz of water, soda, coffee, juice or tea., Disp: 30 packet, Rfl: 0    RiTUXimab (RITUXAN) - Infusion Med Placeholder, This is a placeholder order to keep the  medication list accurate. See therapy plan for details., Disp: , Rfl:     Sennosides (SENOKOT) 8.6 mg Tablet, Take 2 tablets by mouth every day at bedtime., Disp: 60 tablet, Rfl: 0    Tizanidine (ZANAFLEX) 2 mg capsule, See Instructions, 1 CAP AT HS X 3 DAYS, THEN 1 CAP BID X 3 DAYS AND THEN 1 CAP TID THEREAFTER., # 90 Cap, 3 Refill(s), Maintenance, Pharmacy: [facility], 185.42, cm, 05/10/19 13:58:00 PDT, Height/Length (cm), 70.31, kg, 06/24/19 16:33:00 PST, Dose..., Disp: , Rfl:     VITAMIN D PO, Take 1,000 Units by mouth every morning., Disp: , Rfl:     Social history:  He lives in a board and care facility in [place].  He reports smoking in the past, but stopped at the age of 24. No current alcohol or other recreational drug use.   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    Family history:   No family history of multiple sclerosis in family members. Young sister diagnosed with lupus at the age of 38.     EXAMINATION:  Neuro Exam:    Mental Status: Alert and oriented  State: Calm  MMSE:  Not performed  Use of language:  Fluent, normal    He is laying down in a hospital bed with head up.  I am only able to see his face.    Labs/Imaging:   Normal CBC and CMP 09/29/20 except mild anemia.     [facility] labs from 03/2016:  Hep B sAb negative  Hep B sAg negative  Hep B cAb negative  Hep C negative  HIV negative    Labs done at [facility] on 01/19/21:  Hepatitis B and C serologies normal  Quantiferon gold TB neg  IgG and IgM normal  CMP normal  CD19 <1  CBC  unremarkable    Assessment / Plan:  In summary, CLAUD GOWAN Henry is a 50yr old male with a previous diagnosis of multiple sclerosis, advanced, now on rituximab. Severe LE contractures, spasticity, pain. RUE weakness that has improved recently since restarting rituximab, possible early contractures at elbow and shoulder. LUE with weakness that is worse since surgery. His contractures are severe, painful and prevent mobility because he is limited to laying  down on a gurney.  He requires the level of care provided at a SNF.  Olivia Canter, LCSW and [facility] hospital  transfer nurses are working to get him back to a SNF in [place] and for medical transportation to medical appointments that are covered by his insurance.  This was anticipated to happen soon but unfortunately there are no SNFs that will accept him currently.  Will order home health to provide rituximab  at the board and care since he is unable to get transportation to the infusion center.  Home health will also be ordered for physical therapy, occupational therapy, and social services to continue until he is transferred to a SNF.      #MS   - advanced, continue rituximab 1000 mg x 2 doses every 6 months, due next in 06/2021 at [facility]-orders are current.  - Continue Ampyra 10 mg bid for weakness in the arms and legs  - recommend a second COVID booster since he is immunosuppressed    #Spasticity / contractures.   - recommend increasing diazepam 10 mg four times per day (every 6 hours) for spasticity  - continue 5 mg tab, 1-2 diazepam up to 4 times per day as needed for spasticity, to relax muscles for a shower / bath, transfers or during therapy.  - continue baclofen 20 mg four times per day   - Referred to Dr. Roosevelt Locks at [facility] for Botox injections due to severe spasticity of LEs.  Currently unable to get to clinic.  - continue passive ROM with PT and the CNAs at the board and care.     #Home Health:  - wound care- no current wounds.  - home health stretching, strengthening, contracture prevention  - PT and OT to also train board and care staff  - Nursing assessment, CBC, CMP  - rituximab infusion   - CBC, CMP for drug monitoring    Follow-up in 1 month to check status of transfer to SNF.    Discussed case with Dr. Lorenz Coaster who agrees with this plan of care.    Total time I spent in care of this patient today (excluding time spent on other billable services) was 60  minutes.      Sincerely,    Burnadette Peter, MSN, FNP-C  Neurology

## 2021-06-24 ENCOUNTER — Other Ambulatory Visit: Payer: Self-pay | Admitting: Family

## 2021-06-24 ENCOUNTER — Encounter: Payer: Self-pay | Admitting: Family

## 2021-06-24 DIAGNOSIS — G8389 Other specified paralytic syndromes: Secondary | ICD-10-CM

## 2021-06-24 DIAGNOSIS — G35 Multiple sclerosis: Secondary | ICD-10-CM

## 2021-06-24 MED ORDER — GABAPENTIN 100 MG CAPSULE: 100.0000 mg | ORAL_CAPSULE | Freq: Three times a day (TID) | ORAL | 5 refills | Status: DC

## 2021-06-24 MED ORDER — DIAZEPAM 5 MG TABLET: 10.0000 mg | ORAL_TABLET | Freq: Four times a day (QID) | ORAL | 5 refills | Status: DC

## 2021-06-24 MED ORDER — BACLOFEN 10 MG TABLET
20.0000 mg | ORAL_TABLET | Freq: Four times a day (QID) | ORAL | 5 refills | Status: DC
Start: 2021-06-24 — End: 2021-07-22

## 2021-06-24 MED ORDER — RITUXIMAB 10 MG/ML CONCENTRATE,INTRAVENOUS
1000.0000 mg | INTRAVENOUS | 1 refills | Status: DC
Start: 2021-06-24 — End: 2021-09-23

## 2021-06-24 NOTE — Progress Notes (Unsigned)
Rituximab infusion and home health referrals faxed to [facility] [phone], fax confirmation received.    Windell Hummingbird, RN  Neurology

## 2021-06-24 NOTE — Progress Notes (Unsigned)
[facility] intake to follow up on referral, 2 pt identifiers used.  They confirmed receipt of referral but advised that they do not provide therapists (PT/OT) but may be able to provide nursing for infusions if infusion medication order sent to pharmaceutical company to provide medication/supplies, such as with [employer], [employer], [employer], etc.  Advised will check if there is a home health care agency that can provide all services and will call back if infusion services still needed.    Windell Hummingbird, RN  Neurology

## 2021-06-24 NOTE — Progress Notes (Unsigned)
Home health care and infusion referral faxed to [facility] [phone], fax confirmation received.  (Ph [phone] or [phone])    Windell Hummingbird, RN  Neurology

## 2021-06-25 ENCOUNTER — Telehealth: Payer: Self-pay | Admitting: Neurology

## 2021-06-28 ENCOUNTER — Encounter: Payer: Self-pay | Admitting: Neurology

## 2021-06-28 DIAGNOSIS — Z5181 Encounter for therapeutic drug level monitoring: Secondary | ICD-10-CM

## 2021-06-28 DIAGNOSIS — G35 Multiple sclerosis: Secondary | ICD-10-CM

## 2021-06-28 NOTE — Progress Notes (Unsigned)
Received call from [facility] advising that they do not have nursing available to do nursing/infusions request due to scheduling/holidays.      MyChart message was sent to patient as well as voicemail left for pt to obtain current address in [place] in order to assist in finding home health care and infusion assistance.    Windell Hummingbird, RN  Neurology

## 2021-06-28 NOTE — Telephone Encounter (Signed)
Left voicemail requesting pt to read MyChart message or call clinic back to get current address.    Windell Hummingbird, RN  Neurology

## 2021-06-28 NOTE — Progress Notes (Signed)
Rituximab infusion and home health referrals faxed to [facility] [phone], fax confirmation received.  (Phone [phone] option 1 and then option 8)    Pt address has been updated to [employer] address.    Windell Hummingbird, RN  Neurology

## 2021-06-29 NOTE — Progress Notes (Signed)
[facility] and confirmed that referral has been received and is under review.    Windell Hummingbird, RN  Neurology

## 2021-06-30 NOTE — Progress Notes (Signed)
[facility], spoke with [facility], 2 pt identifiers used.  States they reached out and spoke with patient to obtain number to owner of [facility] pt is living at, and are waiting to hear back from owner to see if they are licensed to have patients with IV's.  If so, they will be able to provide pt home health services.    Windell Hummingbird, RN  Neurology

## 2021-07-08 NOTE — Progress Notes (Signed)
Spoke with Monte at [facility], 2 pt identifiers used.  States they were able to get in touch with the owner of pt's facility but they are not licensed to have clients with IV access.  States owner stated pt refused IV/infusion.  They have left messages with pt to call them back to follow up.  States they would be able to provide pt with other services such as PT, OT, social worker, but the IV infusion is the only service they would not be able to provide due to owner not being licensed.      Attempted to call pt, reached voicemail.  Left message advising pt to check MyChart message or call clinic back.  MyChart message sent to patient.    Windell Hummingbird, RN  Neurology

## 2021-07-09 ENCOUNTER — Other Ambulatory Visit: Payer: Self-pay | Admitting: Family

## 2021-07-13 NOTE — Telephone Encounter (Signed)
This is a  REFILL AUTHORIZATION for the electronic request for Colace 100 mg capsules prescribed by Dr. Lorenz Coaster.  Medication refilled per Prescription Refill by Clinic RN Standardized Procedure Policy CW-88.  Last clinic visit 06/22/21.  Next appointment 08/10/21.    Windell Hummingbird, RN  Neurology

## 2021-07-17 ENCOUNTER — Other Ambulatory Visit: Payer: Self-pay | Admitting: Family

## 2021-07-20 NOTE — Telephone Encounter (Signed)
Contacted Monte at [facility], 2 pt identifiers used.  States he spoke to pt today and home health referral went through.  Next step is a nursing assessment to evaluate pt needs but he is not sure yet what date that will happen.  Asked if nursing would be able to provide subcutaneous injections to replace IV infusions, he advised that they should be able to do that as long as medication can be obtained.    Windell Hummingbird, RN  Neurology

## 2021-07-20 NOTE — Progress Notes (Signed)
I sent the request for assistance from case managements weeks ago - as you know - and there hasn't been much movement.

## 2021-07-22 ENCOUNTER — Ambulatory Visit: Payer: Medicare Other | Admitting: Family

## 2021-07-22 DIAGNOSIS — R252 Cramp and spasm: Secondary | ICD-10-CM

## 2021-07-22 DIAGNOSIS — G35 Multiple sclerosis: Secondary | ICD-10-CM

## 2021-07-22 MED ORDER — GABAPENTIN 300 MG CAPSULE: 300.0000 mg | ORAL_CAPSULE | Freq: Three times a day (TID) | ORAL | 3 refills | Status: AC

## 2021-07-22 NOTE — Progress Notes (Signed)
[facility]  [address]  [place], [place]  [address]  Phone [phone] * Fax [phone]      PATIENT:  ISTVAN BEHAR  MRN:         [id number]  DATE:        07/22/2021    I performed this clinical encounter by utilizing a real time telehealth video connection between my location and the patient's location. The patient's location was confirmed during this visit. I obtained verbal consent from the patient to perform this clinical encounter utilizing video and prepared the patient by answering any questions they had about the telehealth interaction.    Dear Colleague,   This is a pertinent summary of the present condition and overall health of Alik Mawson III, born October 22, 1970, obtained via this video visit.      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 Blenda Peals at [facility] and [facility] 07[facility]07[facility].  He last received it at [facility] on 01/05/21 and 01/19/21. He tolerates rituximab well with no known side effects.  He was last seen by me on 06/22/2021 with the following plan:    #MS   - advanced, continue rituximab 1000 mg x 2 doses every 6 months, due next in 06/2021 at [facility]-orders are current.  - Continue Ampyra 10 mg bid for weakness in the arms and legs  - recommend a second COVID booster since he is immunosuppressed     #Spasticity / contractures.   - recommend increasing diazepam 10 mg four times per day (every 6 hours) for spasticity  - continue 5 mg tab, 1-2 diazepam up to 4 times per day as needed for spasticity, to relax muscles for a shower / bath, transfers or during therapy.  - continue baclofen 20 mg four times per day   - Referred to Dr. Roosevelt Locks at [facility] for Botox injections due to severe spasticity of LEs.  Currently unable to get to clinic.  - continue passive ROM with PT and the CNAs at the board and  care.      #Home Health:  - wound care- no current wounds.  - home health stretching, strengthening, contracture prevention  - PT and OT to also train board and care staff  - Nursing assessment, CBC, CMP  - rituximab infusion   - CBC, CMP for drug monitoring     Follow-up in 1 month to check status of transfer to SNF.     He was getting better after his last visit at the [facility].  He was able to sit upright.  A spot opened up in an assisted living facility in [place].  He moved to this facility on April 16, 2021.  He is due for Rituxan in December 2022.  There are no nurses or doctors there.  He has been laying flat on a bed all day in a room.  He is not on a hospital bed.  His caregivers want him to pay for his medications out of pocket.  He is crying from pain because he has ran out of diazepam and pain medication.  He was under the impression that he would be able to be mobile in his wheelchair.  He called [facility] to ask to go back.  The staff at the new facility tell him that he now has to pay out of pocket for medications, ambulance rides, and medical appointments.  He feels that he is declining rapidly.  His legs are contracting again.  The care workers are very young and have difficulty turning him.  He does not want to call adult protective services about this facility.  He feels that they are doing their best to take care of him but that he needs more than they can provide.      His sister and daughter live in [place].  His dad thought Olyn was going to die so gave [place] belongings away and they do not have a relationship.  He doesn't have any family that lives close by.  His electric wheelchair is at his fathers house and he was told that he could sue his dad to give it back to him.      He has not been able to get transferred to a SNF due to the medication.  He now has a hospital bed at there board and care facility.  He is due for  rituximab in December 2022 but is unable to get to the infusion center due to transportation.  We discussed the possibility of getting the rituximab at the [facility].  He denies side effects from the rituximab.  Currently, he is unable to get transportation to [facility] to receive the rituximab.  He was transferred to a board and care facility in [place] on 04/16/21.  I last saw him on 04/30/21 and have been trying to move him to a local SNF since then.  Unfortunately, [facility] staff have been unable to find a SNF able to accept him.  He was advised to go to the local ER by Dr. Lorenz Coaster but he couldn't afford the nearly $1000 cost for transportation.      Interval History 07/22/21:  A nurse, physical therapist, social worker, and occupational therapist through home health will be evaluating him in about a week.  Once they see him, if they determine that he needs hospitalization, they will make these arrangements to send him to the hospital with plans to transfer to a SNF.    He states that the gabapentin is helping for pain but not enough.  Pain level is 5/10 in the left hip, knee, and thigh.  He still has significant LE spasticity causing contractions despite valium 10 mg every 6 hours and baclofen 20 mg four times a day.  Spasms have lessened to about 3-4 spasms per day now.    He has a red area on his buttocks that they are treating with a cream.    Current MS symptoms:  His MS symptoms are about the same.  He has some fatigue.  He gets about 6 hours of sleep a night.  He uses caffeine/coffee for fatigue.  He denies cognitive problems.  He has good long term memory.  Denies vision problems other than reading glasses.  No speech or swallowing problems.  He has gained weight.  He has weakness of the right arm and both legs and is now losing the grip in his left hand.  He has significant spasticity and contractures.  He is non-ambulatory.  He has urinary incontinence and has had past UTIs, none recent.  He has  constipation and takes preventative medications as well as laxatives prn.  He is now not depressed but sometimes feels that he may not get better.  He has no  social interaction.  The care providers give him his medications.     He had a yeast infection in the groin recently that is now resolved.  No other infections reported.  He has been vaccinated with 3 COVID vaccines.    He had a hernia repair surgery on 09/21/20.     He feels that the Ampyra helps with the tightening of the right hand.    The legs are contracted and he hopes to get improvement in mobility (previously had benefit from the Botox at [facility]).  He was seen in PM&R for baclofen pump placement.  He did the trial but had an allergic reaction after the instillation so will not be proceeding with the baclofen pump.  He has an appointment with Dr. Roosevelt Locks on 05/12/21 for evaluation for Botox.    Last MRI done in 2017. Might need large bore or open MRI due to leg contractures.     He had COVID in 07/2019 (Omicron), mild case.     MS History:  The onset of symptoms was when he was 35 years ago in 2007 with symptoms of lower extremity weakness, progressing to inability to bear weight. Mr. Gervasi was previously on copaxone, tysabri and rituximab treatments in the past, all of which have worked well in the past, according to him. He also received multiple botox injections for his contractures before COVID, and that, according to patient, has helped with his symptoms as well. Ampyra was helpful in the past; however, was accidentaly stopped during his transfer from one acute rehab to another as pt believes they lost some records during the transfer. He restarted rituximab by Dr. Blenda Peals in 06/2020, 1000 mg every 6 months. Prior to that infusion, the other infusion was over two years prior. Mr. Vaquera said that because of COVID-19 he had difficulty organizing transportation to get infusion at the center. When Mr. Rosenow was on regular infusions, he reported to not have  any relapses at that time.  He states that he tried Tecfidera about 5-6 years ago and stopped it due to rash and swelling all over.    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    Incisional hernia, without obstruction or gangrene 09/21/2020     Past Medical History:   Diagnosis Date    GERD (gastroesophageal reflux disease)     Hypertension     Immobility     bed/wheelchair bound    Multiple sclerosis (HCC)      Allergies:    Tegretol [Carbamazepine]    Hives    Medications:     Current Outpatient Medications:     Acetaminophen (TYLENOL) 500 mg Tablet, Take 2 tablets by mouth every 6 hours., Disp: 100 tablet, Rfl: 0    Amlodipine (NORVASC) 10 mg Tablet, Take 10 mg by mouth every day., Disp: , Rfl:     Baclofen (LIORESAL) 10 mg Tablet, Take 2 tablets by mouth 4 times daily., Disp: 240 tablet, Rfl: 5    Dalfampridine (AMPYRA) 10 mg ER Tablet, Take 1 tablet by mouth 2 times daily. Needs brand name, the generic didn't work for him., Disp: 60 tablet, Rfl: 11    Diazepam (VALIUM) 5 mg Tablet, Take 2 tablets by mouth every 6 hours. Take  around the clock, not as needed. See separate rx for ADDITIONAL doses as needed., Disp: 60 tablet, Rfl: 5    Docusate (COLACE) 100 mg Capsule, TAKE 1 CAPSULE BY MOUTH TWICE A DAY, Disp: 60 capsule, Rfl: 0    Gabapentin (NEURONTIN) 100 mg Capsule, Take 1 capsule by mouth 3 times daily., Disp: 90 capsule, Rfl: 5    Lidocaine (LIDODERM) 5 %(700 mg/patch) Patch, Apply 1 patch to the skin every 24 hours. Apply to painful area (12 hours on, 12 hours off), Disp: 30 patch, Rfl: 0    Magnesium Hydroxide (MILK OF MAGNESIA) 400 mg/5 mL Liquid, Take 30 mL by mouth once daily if needed., Disp: 900 mL, Rfl: 0    Naloxone (NARCAN) 4 mg/actuation Nasal  Spray, Use for opioid overdose. Give 1 spray in nostril. If no response / breathing slows down, repeat every 2 minutes until emergency help arrives., Disp: 2 each, Rfl: 0    onabotulinumtoxinA (BOTOX INJ), 0 Refill(s), Maintenance, Disp: , Rfl:     Polyethylene Glycol 3350 (MIRALAX) 17 gram Powder, Take 1 packet by mouth every morning. Mix in 4 to 8 oz of water, soda, coffee, juice or tea., Disp: 30 packet, Rfl: 0    RiTUXimab (RITUXAN) - Infusion Med Placeholder, This is a placeholder order to keep the medication list accurate. See therapy plan for details., Disp: , Rfl:     RITUXIMAB 10 mg/mL Injection, 100 mL by IV route every 2 weeks. Send orders to home health. Rituximab 1000 mg x 2 doses, on Day 1 and Day 15, IV every 6 months.  Next dose due 07/07/21.  Administer according to prescribing information.  Premedications to be administered at least 30 minutes prior to start of Rituximab infusion:  - IV methylprednisolone 100 mg  - PO acetaminophen 650 mg  - PO benadryl 25 mg   Hypersensitivity medications:  - IV benadryl 25-50 mg every 6 hours as needed for itching/allergic reaction  - PO famotidine 25 mg x 1 if needed in addition to benadryl for itching/allergic reaction  - PO acetaminophen 650 mg every 4 hours if needed (not to exceed 3000 mg total daily dose) for headache  - IV methylprednisolone 125 mg x 1 for allergic reaction refractory to benadryl or for more severe allergic reactions, Disp: 2 each, Rfl: 1    Tizanidine (ZANAFLEX) 2 mg capsule, See Instructions, 1 CAP AT HS X 3 DAYS, THEN 1 CAP BID X 3 DAYS AND THEN 1 CAP TID THEREAFTER., # 90 Cap, 3 Refill(s), Maintenance, Pharmacy: [facility], 185.42, cm, 05/10/19 13:58:00 PDT, Height/Length (cm), 70.31, kg, 06/24/19 16:33:00 PST, Dose..., Disp: , Rfl:     VITAMIN D PO, Take 1,000 Units by mouth every morning., Disp: , Rfl:     Social history:  He lives in a board and care facility in [place].  He reports smoking in the past, but stopped at the age of  46. No current alcohol or other recreational drug use.   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    Family history:   No family history of multiple sclerosis in family members. Young sister diagnosed with lupus at the age of 72.     EXAMINATION:  Neuro Exam:    Mental Status: Alert and oriented  State: Calm  MMSE:  Not performed  Use of language:  Fluent, normal    He is laying down in a hospital bed with head up.  I am only able  to see his face.    Labs/Imaging:   Normal CBC and CMP 09/29/20 except mild anemia.     [place] labs from 03/2016:  Hep B sAb negative  Hep B sAg negative  Hep B cAb negative  Hep C negative  HIV negative    Labs done at [facility] on 01/19/21:  Hepatitis B and C serologies normal  Quantiferon gold TB neg  IgG and IgM normal  CMP normal  CD19 <1  CBC  unremarkable    Assessment / Plan:  In summary, MAXIMIANO LOTT III is a 51yr old male with a previous diagnosis of multiple sclerosis, advanced, now on rituximab. Severe LE contractures, spasticity, pain. RUE weakness that has improved recently since restarting rituximab, possible early contractures at elbow and shoulder. LUE with weakness that is worse since surgery. His contractures are severe, painful and prevent mobility because he is limited to laying down on a gurney.  He requires the level of care provided at a SNF.  Olivia Canter, LCSW and [facility] hospital  transfer nurses are working to get him back to a SNF in [place] and for medical transportation to medical appointments that are covered by his insurance.  This was anticipated to happen soon but unfortunately there are no SNFs that will accept him currently and he was unable to go to the local emergency room due to the cost of an ambulance.  Home health ordered to provide rituximab at the board and care since he is unable to get transportation to the infusion center but the board and care is not licensed to have IV medications given there.  Home health will also be  ordered for physical therapy, occupational therapy, and social services to continue until he is transferred to a SNF.      #MS   - advanced, plan to switch to Mayzent or Kesimpta.  I have asked Kinnie Feil, pharmacist to assist with transition.  I will check with staff to see if SNF will accept him with either of these medications.  - Continue Ampyra 10 mg bid for weakness in the arms and legs  - recommend a second COVID booster since he is immunosuppressed    #Spasticity / contractures.   - continue diazepam 10 mg four times per day (every 6 hours) for spasticity  - continue 5 mg tab, 1-2 diazepam up to 4 times per day as needed for spasticity, to relax muscles for a shower / bath, transfers or during therapy.  - continue baclofen 20 mg four times per day   - Referred to Dr. Roosevelt Locks at [facility] for Botox injections due to severe spasticity of LEs.  Currently unable to get to clinic.  - continue passive ROM with PT and the staff at the board and care.   - increase gabapentin to 300 mg TID for pain    #Home Health:  - wound care-monitor stage 1 red area on buttocks  - home health stretching, strengthening, contracture prevention  - PT and OT to also train board and care staff  - Nursing assessment, CBC, CMP, possible medication administration of Kesimpta  - CBC, CMP for drug monitoring    Follow-up in 2 weeks to check status of transfer to SNF and medication change status.    Discussed case with Dr. Lorenz Coaster who agrees with this plan of care.    Total time I spent in care of this patient today (excluding time spent on other billable services) was 60 minutes.  Sincerely,    Burnadette Peter, MSN, FNP-C  Neurology

## 2021-07-22 NOTE — Patient Instructions (Signed)
Thank you for visiting the Clinical Neurosciences Department at the [facility] today.    The neurologist(s) you saw today was(were):   Nurse Practitioner: Burnadette Peter NP     Recommendations made during today's visit include:       The following diagnostic tests have been ordered to help [place] better determine the underlying cause of your condition as well as guide best treatment. :    Will order labs once we decide on a new treatment.    The following changes to your medication regimen have been made:    Increase the gabapentin to 300 mg three times daily.   Consider switching rituximab to Kesimpta or Gilenya.  Kinnie Feil, pharmacist will call you about the new medication.    The following referrals have been generated through the [facility] system:   None      An open line of communication between you, your family, and [place] (your neurology team) is important to [place], both for safety and maintaining the highest quality of patient care.  --------------------------------------------------------------------------------------------------------    If you have any questions, please feel free to ask [place]!    1. By telephone.  You can reach your resident neurologist by calling the [facility] neurology clinic at [phone].    2. Online. We also encourage you to register for [employer]," which allows you online computer access to your medical chart, as well as an opportunity to e-mail your doctor regarding non-urgent issues.  You can sign up by visiting the following link: [email or web address]".    Thank you for choosing the Neurology department at the [facility].  It was a pleasure to participate in your care today    Sincerely,     Burnadette Peter, NP-BC

## 2021-07-22 NOTE — Telephone Encounter (Signed)
Rx for 90 day supply of Baclofen 10 mg tablet sent to [facility] per electronic request.    Windell Hummingbird, RN  Neurology

## 2021-07-26 ENCOUNTER — Other Ambulatory Visit: Payer: Self-pay

## 2021-07-26 NOTE — Telephone Encounter (Signed)
Called pt to  complete Kesimpta prescreening labs. Also need  send him Kesimpta start form to sign and return, verify  e-mail.    No answer left message     Twana First  Specialty Technician  Specialty Pharmacy   P: [phone] Option 9 then  #4  07/26/21  4:28 PM

## 2021-07-27 ENCOUNTER — Other Ambulatory Visit: Payer: Self-pay

## 2021-07-28 ENCOUNTER — Ambulatory Visit: Payer: Medicare Other | Admitting: Neurology

## 2021-07-28 ENCOUNTER — Other Ambulatory Visit: Payer: Self-pay

## 2021-07-28 NOTE — Progress Notes (Addendum)
Spoke w/ pt he will complete pre-screening labs at [facility].  Orders placed      Also  pt would like Kesimpta start forms mailed to  address on file:  [place] [id number]  Return instructions included along with return envelope             [facility]   P: [phone] Option 9 then  #4  07/28/21  2:03 PM

## 2021-08-04 IMAGING — CT CT Abdomen and Pelvis W Contrast
2 of 3 series · 15 of 46 positions shown, 17 images · IV contrast (omnipaque)
Comparison: None

CT Abdomen and Pelvis W-Contrast
INDICATION: Abdominal Pain.                                                              
 Pertinent History: loose stools since [REDACTED] and intermittent, dull/pressure            
 abdominal pain that started this afternoon. Pt denies blood in stool. Denies              
 urinary S/S. Denies CP, SOB, fever, cough, chills. Denies N/V. Hx of HTN, GERD.           
 Surgical History:                                                                         
 Cancer: None                                                                              
 GFR (past 30 days): >39 ml/min                                                            
 Metformin: None                                                                           
 Lipase: N/A       Amylase: N/A      WBC: N/A                                              
 Intravenous contrast: 80 mL Omnipaque 350                                                 
 Oral contrast: No                                                                         
 Technologist Comments: None
TECHNIQUE: Routine with IV contrast. Helical acquisition with sagittal and                
 coronal reformations; post IV contrast images.                                            
 Utilized dose reduction techniques include: Automated Exposure Control, vendor            
 specific iterative reconstruction technique

[Series 3: st abd/pel · axial · 0.85mm/px · z∈[+241,+701]mm · 12 of 106 slices shown, 14 images]
[im 7/106  soft-tissue]
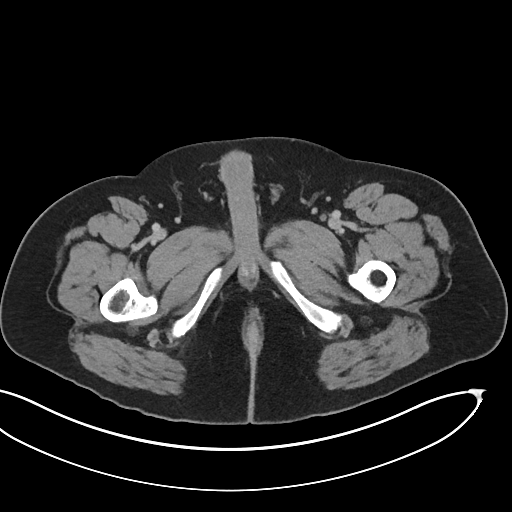
[im 7/106  bone]
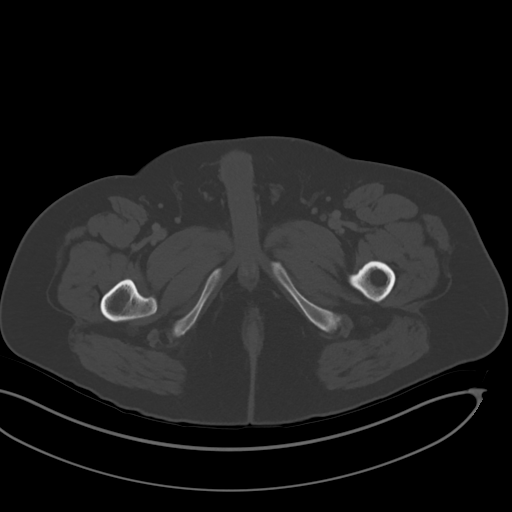
[im 14/106  soft-tissue]
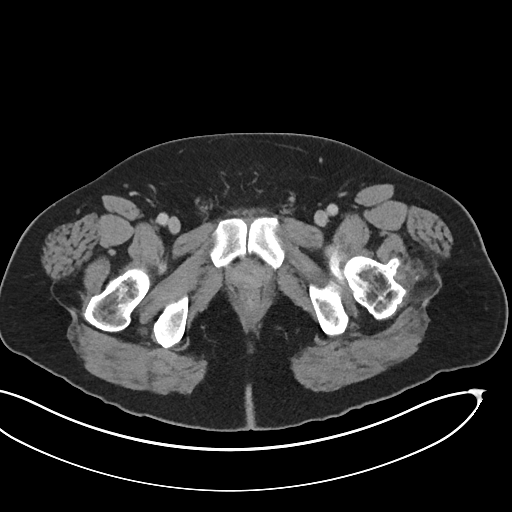
[im 24/106  soft-tissue]
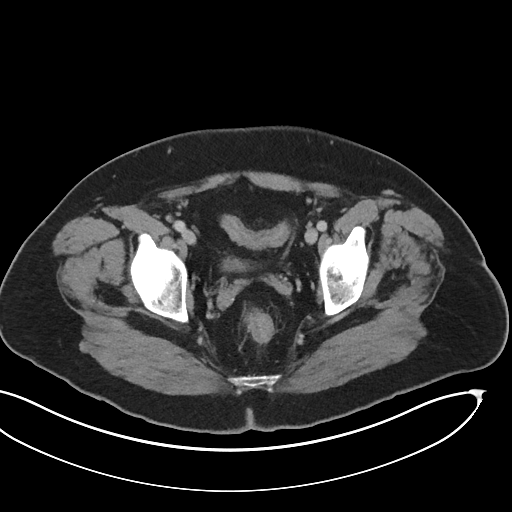
[im 31/106  soft-tissue]
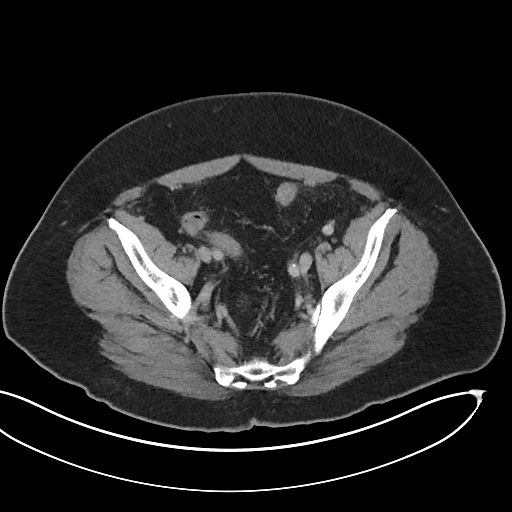
[im 41/106  soft-tissue]
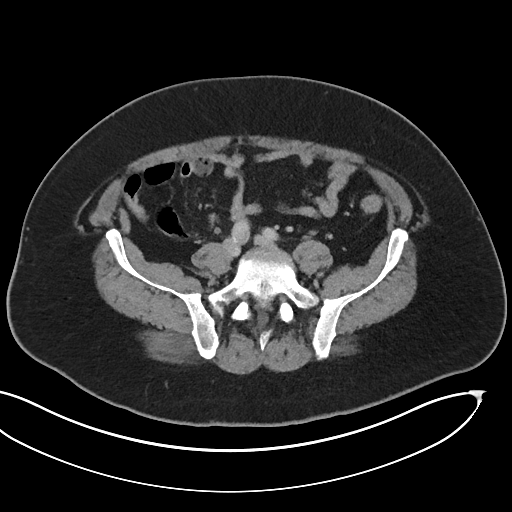
[im 48/106  soft-tissue]
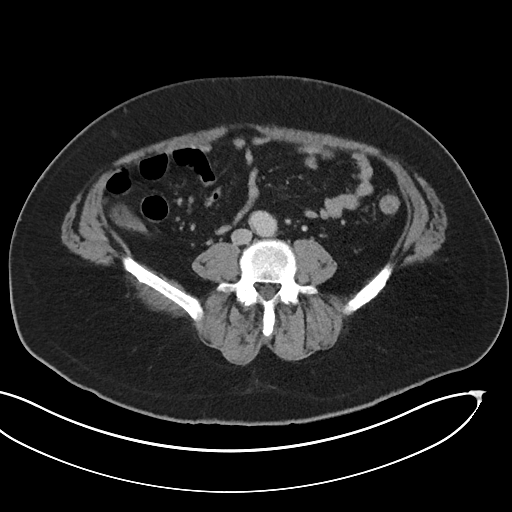
[im 58/106  soft-tissue]
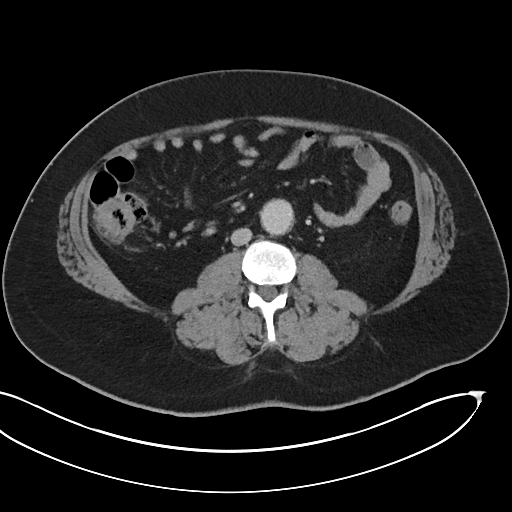
[im 65/106  soft-tissue]
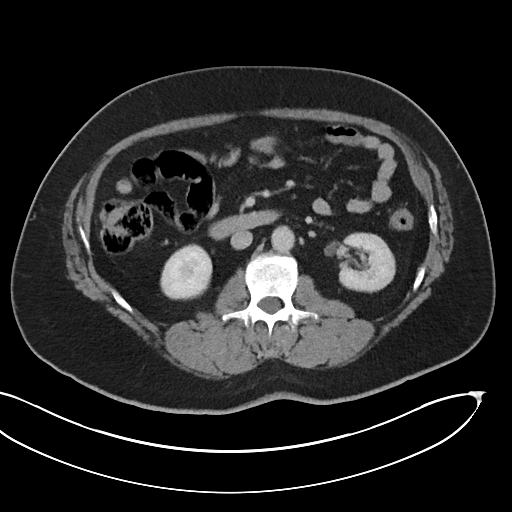
[im 75/106  soft-tissue]
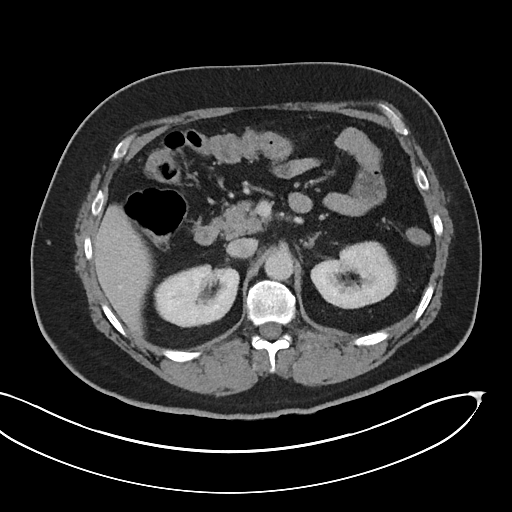
[im 75/106  bone]
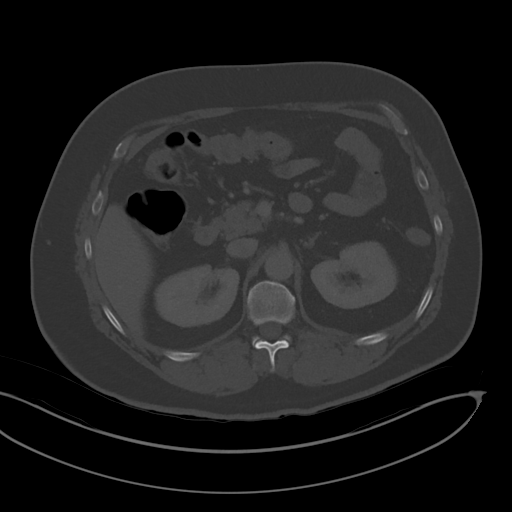
[im 82/106  soft-tissue]
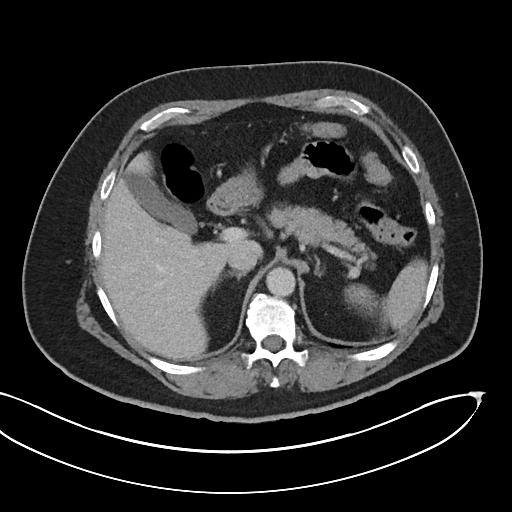
[im 92/106  soft-tissue]
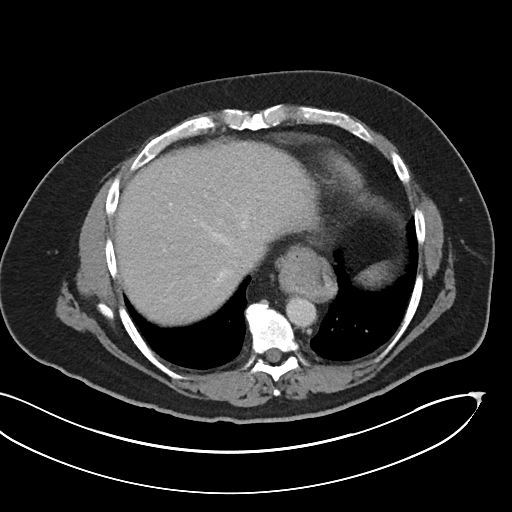
[im 99/106  soft-tissue]
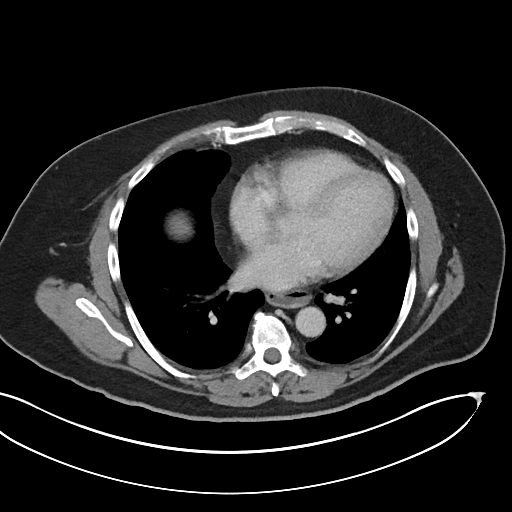

[Series 4: cor abd/pel · coronal · 0.97mm/px · 3 of 149 slices shown]
[im 50/149  soft-tissue]
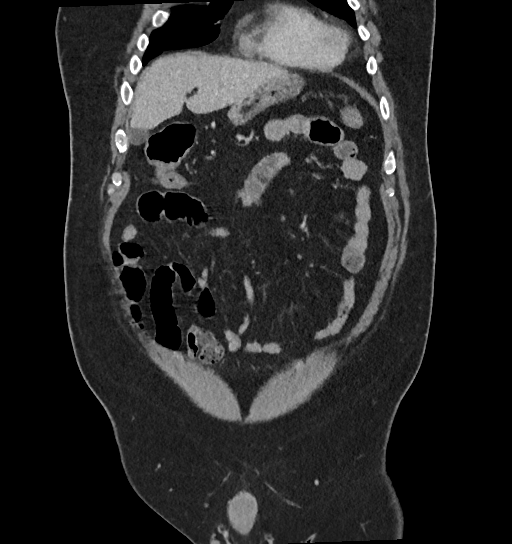
[im 66/149  soft-tissue]
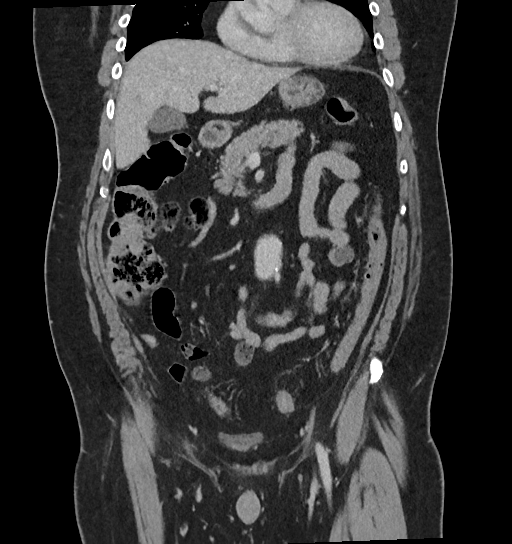
[im 83/149  soft-tissue]
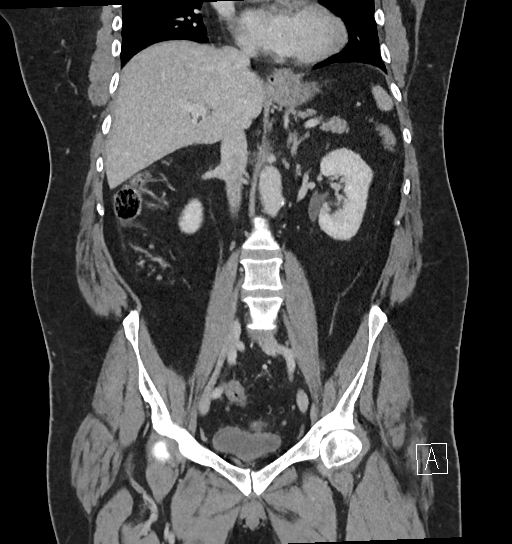

[15 of 46 positions shown; findings below may reference images not displayed]

FINDINGS: The included lung bases are without mass.                                                 
 The gallbladder is present. No suspicious liver mass. The pancreas and spleen             
 have normal CT appearance.                                                                
 The adrenal glands have normal contour. Bilateral symmetric excretion of                  
 contrast from the kidneys without hydronephrosis.                                         
 The abdominal aorta and iliac vessels demonstrate mild atheromatous                       
 calcification. There are areas of dilation of the distal abdominal aorta up to 3          
 cm. No suspicious lymphadenopathy.                                                        
 The bladder is partially distended. Prostate size is within normal range.                 
 No bowel obstruction, free air, pneumatosis, or abscess. Normal appendix.                 
 Moderate paraesophageal hiatal hernia. Very mild wall thickening with submucosal          
 edema of the transverse colon, descending and sigmoid colon suggested, although           
 findings may be exaggerated by lack of distention. Minimal pericolonic                    
 inflammatory change at these areas.                                                       
 No suspicious bony lesions. There are degenerative changes of the lumbar spine.           
 Chronic left hip deformity, including coxa magna and widened left                         
 femoral                                                                                   
 acetabulum.
IMPRESSION: No bowel obstruction, significant free fluid, free air, pneumatosis, or                   
 abscess.                                                                                  
 Suggestion of mild wall thickening of the transverse, descending and sigmoid              
 colon. The colitis may be due to infectious or inflammatory etiology. Moderate            
 hiatal hernia.                                                                            
 Dilation of the distal abdominal aorta up to 3 cm.                                        
 The preliminary report was provided by Dr. Vii of [HOSPITAL] at time of          
 the study.

## 2021-08-05 ENCOUNTER — Other Ambulatory Visit: Payer: Self-pay

## 2021-08-06 ENCOUNTER — Other Ambulatory Visit: Payer: Self-pay

## 2021-08-09 ENCOUNTER — Other Ambulatory Visit: Payer: Self-pay

## 2021-08-09 ENCOUNTER — Encounter: Payer: Self-pay | Admitting: Family

## 2021-08-09 NOTE — Telephone Encounter (Signed)
From: [name]  To: Lorne Skeens, NP  Sent: 08/09/2021 9:42 AM PST  Subject: I'm having reservations    I'm having reservations I saw the price of the recent disease modifying drug that was an option for me but the price is almost equal to the one I was taking before and I was being rejected from nursing facilities because of that issue I believe my insurance would cover it but apparently that's not the case I would rather be close to a place near your facility that cost the treatment is not an issue and I can receive Botox to help me set up I've been laid on my back for almost 4 months now

## 2021-08-10 ENCOUNTER — Other Ambulatory Visit: Payer: Self-pay

## 2021-08-10 ENCOUNTER — Ambulatory Visit: Payer: Medicare Other | Admitting: Family

## 2021-08-10 NOTE — Progress Notes (Signed)
Specialty Pharmacy Prior Authorization Status    Prior authorization for Kesimpta 20mg  was submitted through CoverMyMed to [place].     CMM Key: [id number]  Case Ref#  [id number]    Will provide status update once a final decision has been determined.      [facility]  Phone: [phone] (Option #9 then Ext 4)  Fax: [phone]  08/10/21 4:21 PM

## 2021-08-12 NOTE — Progress Notes (Signed)
Specialty Pharmacy Prior Authorization Status Update    Prior authorization has been denied.     CMM Key:  [id number]  Case Ref#: [id number]  Authorization Date: DENIED    If PA denied: DENIED due to no trial/failure of Betaseron and Ocrevus. Will email denial to Anderson Malta    Will notify Clinical Pharmacist of patient's prior authorization status to determine next appropriate steps.      [facility]  Phone: [phone] (Option #9 then Ext 4)  Fax: [phone]  08/12/21 7:12 AM

## 2021-08-13 NOTE — Progress Notes (Signed)
Appeal submitted to [place] via RightFax    [facility]  Phone: [phone] (Option #9 then Ext 4)  Fax: [phone]  08/13/21 4:06 PM

## 2021-08-13 NOTE — Progress Notes (Signed)
Appeal letter written for reconsideration of Kesimpta. Will submit appeal letter to insurance and update pt on the status.     Wilfred Lacy, PharmD, CSP, MPH  Clinical Specialty Pharmacist   PHN [phone] ext [phone]

## 2021-08-15 ENCOUNTER — Other Ambulatory Visit: Payer: Self-pay | Admitting: Neurology

## 2021-08-17 ENCOUNTER — Telehealth: Payer: Self-pay | Admitting: Family

## 2021-08-17 ENCOUNTER — Other Ambulatory Visit: Payer: Self-pay

## 2021-08-17 NOTE — Progress Notes (Signed)
Kesimpta appeal received and overturned -  med covered  08/14/21-08/14/22 pa# [place]  Specialty Technician  Specialty Pharmacy   P: [phone] Option 9 then  #4  08/17/21  3:58 PM

## 2021-08-17 NOTE — Telephone Encounter (Signed)
TC to [facility] to request recent PT, OT, and RN evaluations for patient be faxed to [place].  Staff confirmed that they will fax these today.  Burnadette Peter, MSN, FNP-C  Neurology

## 2021-08-17 NOTE — Progress Notes (Deleted)
Form received and pending  review on 08/12/2021.  Decision date 08/27/21  per Evette Doffing at [place]    [place]   P: [phone] Option 9 then  #4  08/17/21  3:25 PM

## 2021-08-18 ENCOUNTER — Other Ambulatory Visit: Payer: Self-pay

## 2021-08-20 ENCOUNTER — Other Ambulatory Visit: Payer: Self-pay

## 2021-08-20 NOTE — Progress Notes (Signed)
Pt may  fill  at [facility]:        [place]   P: [phone] Option 9 then  #4  08/20/21  2:50 PM

## 2021-08-23 ENCOUNTER — Other Ambulatory Visit: Payer: Self-pay

## 2021-08-23 NOTE — Progress Notes (Signed)
Prescreen labs faxed to [facility] [address]. [place], [place] [address]  ph [phone] Fax: [place]  Specialty Technician  Specialty Pharmacy   P: [phone] Option 9 then  #4  08/23/21  2:56 PM

## 2021-08-24 ENCOUNTER — Other Ambulatory Visit: Payer: Self-pay | Admitting: Neurology

## 2021-08-24 ENCOUNTER — Other Ambulatory Visit: Payer: Self-pay | Admitting: Family

## 2021-08-24 MED ORDER — DIAZEPAM 5 MG TABLET: 10.0000 mg | ORAL_TABLET | Freq: Four times a day (QID) | ORAL | 5 refills | Status: AC

## 2021-08-26 NOTE — Progress Notes (Addendum)
Spoke with  [employer] @ [place]   ph [phone].  Staff is in a meeting,  she took message to  fax [place] results.  Will call back  if any  problems.  [name] could not tell me if labs were completed, she is only answering phones and taking messages     Received call back  from  nurse [name].  Pt had an  appointment today but were unable to  draw blood.  Pt has a follow up  appt  on mon 2/13.  At that time they  will attempt to draw all the  labs.  Once they  get results they  will  fax back to [place]          [place]: [phone] Option 9 then  #4  08/26/21  11:26 AM

## 2021-09-01 ENCOUNTER — Other Ambulatory Visit: Payer: Self-pay

## 2021-09-01 NOTE — Progress Notes (Signed)
Spoke with  [facility]  @ [place]   ph [phone].   All labs completed except Quantiferon TB Gold.  They  needed kit to complete, kit was ordered and they plan on  completing missing lab by end of next week. Once complete Oletta Lamas will  fax.    In the mean time other completed labs received and forward to  [facility]  via e-mail      [place]: [phone] Option 9 then  #4  09/01/21  2:31 PM

## 2021-09-09 NOTE — Progress Notes (Signed)
Spoke with  [facility]  @ [place]   ph [phone], Quantiferon TB [place] lab was drawn mon 2/20 but the tech did not draw enough  blood to complete the  test.    Another nurse is scheduled to go to pt today( 09/09/21 )  to  draw for lab.  Per Oletta Lamas once results are in he will fax back to [place]    [place]   P: [phone] Option 9 then  #4  09/09/21  11:22 AM

## 2021-09-10 ENCOUNTER — Other Ambulatory Visit: Payer: Self-pay

## 2021-09-10 MED ORDER — KESIMPTA PEN 20 MG/0.4 ML SUBCUTANEOUS PEN INJECTOR
20.0000 mg | PEN_INJECTOR | SUBCUTANEOUS | 3 refills | Status: DC
  Filled 2021-09-10 – 2021-10-05 (×2): qty 1.2, 84d supply, fill #0
  Filled 2021-10-12 – 2021-12-25 (×2): qty 1.2, 84d supply, fill #1
  Filled 2022-03-15: qty 1.2, 84d supply, fill #2
  Filled 2022-04-06 – 2022-06-06 (×2): qty 1.2, 84d supply, fill #3

## 2021-09-10 MED ORDER — KESIMPTA PEN 20 MG/0.4 ML SUBCUTANEOUS PEN INJECTOR
20.0000 mg | PEN_INJECTOR | SUBCUTANEOUS | 0 refills | Status: AC
  Filled 2021-09-10: qty 1.2, 21d supply, fill #0

## 2021-09-10 NOTE — Progress Notes (Signed)
Called pt to notify of Kesimpt approval, counsel on medication and arrange delivery, LM for pt to return call.     Wilfred Lacy, PharmD, CSP, MPH  Clinical Specialty Pharmacist   PHN [phone] ext [phone]

## 2021-09-10 NOTE — Addendum Note (Signed)
Addended by: Kinnie Feil on: 09/10/2021 12:02 PM     Modules accepted: Orders

## 2021-09-10 NOTE — Progress Notes (Signed)
Loading dose covered with $0 co-pay      Cont dose next fill 09/27/21        [facility]  Specialty Technician  Specialty Pharmacy   P: [phone] Option 9 then  #4  09/10/21  12:44 PM

## 2021-09-13 NOTE — Progress Notes (Signed)
Left second message for pt to return call.     Shirline Kendle Reynolds, PharmD, CSP, MPH  Clinical Specialty Pharmacist   PHN [phone] ext 4

## 2021-09-14 ENCOUNTER — Telehealth: Payer: Self-pay | Admitting: Family

## 2021-09-14 ENCOUNTER — Other Ambulatory Visit: Payer: Self-pay

## 2021-09-14 NOTE — Progress Notes (Signed)
Labs completed, printed and placed in MD's box for review and scanning into EMR.     Dary Dilauro Reynolds, PharmD, MPH  Clinical Specialty Pharmacist   PHN [phone] ext [phone]

## 2021-09-14 NOTE — Telephone Encounter (Addendum)
PA submitted to [employer]#[id number] for Diazepam (8 per day), via covermymeds Key: [id number]. Marked Urgent.    Approved quantity limits effective 07/18/2021-07/17/2022 LZBC#[id number]. Faxed letter to pharmacy.    Itxel Wickard   PSR III - Medications/Infusions Prior [address] Neuroscience Dept.  Ph [phone]

## 2021-09-16 ENCOUNTER — Other Ambulatory Visit: Payer: Self-pay

## 2021-09-17 ENCOUNTER — Ambulatory Visit: Payer: Medicare Other | Admitting: Family

## 2021-09-17 DIAGNOSIS — M2459 Contracture, other specified joint: Secondary | ICD-10-CM

## 2021-09-17 DIAGNOSIS — M24561 Contracture, right knee: Secondary | ICD-10-CM

## 2021-09-17 DIAGNOSIS — R531 Weakness: Secondary | ICD-10-CM

## 2021-09-17 DIAGNOSIS — M24562 Contracture, left knee: Secondary | ICD-10-CM

## 2021-09-17 DIAGNOSIS — L89301 Pressure ulcer of unspecified buttock, stage 1: Secondary | ICD-10-CM

## 2021-09-17 DIAGNOSIS — G35 Multiple sclerosis: Secondary | ICD-10-CM

## 2021-09-17 NOTE — Progress Notes (Signed)
[facility]  [address]  [place], [place]  [address]  Phone [phone] * Fax [phone]      PATIENT:  SALMAAN PATCHIN  MRN:         [id number]  DATE:        09/17/2021    I performed this clinical encounter by utilizing a real time telehealth video connection between my location and the patient's location. The patient's location was confirmed during this visit. I obtained verbal consent from the patient to perform this clinical encounter utilizing video and prepared the patient by answering any questions they had about the telehealth interaction.    Dear Colleague,   This is a pertinent summary of the present condition and overall health of Alexes Lamarque III, born [birth year], obtained via this video visit.      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 Blenda Peals at [facility] and [facility].  He last received it at [facility] on 01/05/21 and 01/19/21. He tolerates rituximab well with no known side effects.  He is unable to continue Rituximab since moving to a board and care facility in [place].  He was due for rituximab in December 2022.  Plan was to switch to Kesimpta but there was a delay in getting the pre-dose blood testing done by home health.  Per Kinnie Feil, pharmacist, he can now start [place].  He was last seen by me on 07/22/2021 with the following plan:    #MS   - advanced, plan to switch to Mayzent or Kesimpta.  I have asked Kinnie Feil, pharmacist to assist with transition.  I will check with staff to see if SNF will accept him with either of these medications.  - Continue Ampyra 10 mg bid for weakness in the arms and legs  - recommend a second COVID booster since he is immunosuppressed     #Spasticity / contractures.   - continue diazepam 10 mg four times per day (every 6 hours) for  spasticity  - continue 5 mg tab, 1-2 diazepam up to 4 times per day as needed for spasticity, to relax muscles for a shower / bath, transfers or during therapy.  - continue baclofen 20 mg four times per day   - Referred to Dr. Roosevelt Locks at [facility] for Botox injections due to severe spasticity of LEs.  Currently unable to get to clinic.  - continue passive ROM with PT and the staff at the board and care.   - increase gabapentin to 300 mg TID for pain     #Home Health:  - wound care-monitor stage 1 red area on buttocks  - home health stretching, strengthening, contracture prevention  - PT and OT to also train board and care staff  - Nursing assessment, CBC, CMP, possible medication administration of Kesimpta  - CBC, CMP for drug monitoring   Follow-up in 2 weeks to check status of transfer to SNF and medication change status.    He was getting better after his last visit at the [facility].  He was able to sit upright.  A spot opened up in an assisted living facility in [place].  He moved to this facility on April 16, 2021.  He is due for Rituxan in December  2022.  There are no nurses or doctors there.  He has been laying flat on a bed all day in a room.  He is not on a hospital bed.  His caregivers want him to pay for his medications out of pocket.  He is crying from pain because he has ran out of diazepam and pain medication.  He was under the impression that he would be able to be mobile in his wheelchair.  He called [facility] to ask to go back.  The staff at the new facility tell him that he now has to pay out of pocket for medications, ambulance rides, and medical appointments.  He feels that he is declining rapidly.  His legs are contracting again.  The care workers are very young and have difficulty turning him.  He does not want to call adult protective services about this facility.  He feels that they are doing their best to take care of  him but that he needs more than they can provide.      His sister and daughter live in [place].  His dad thought Nellie was going to die so gave [place] belongings away and they do not have a relationship.  He doesn't have any family that lives close by.  His electric wheelchair is at his fathers house and he was told that he could sue his dad to give it back to him.      He has not been able to get transferred to a SNF due to the medication.  He now has a hospital bed at there board and care facility.  He is due for rituximab in December 2022 but is unable to get to the infusion center due to transportation.  We discussed the possibility of getting the rituximab at the Board and Care.  He denies side effects from the rituximab.  Currently, he is unable to get transportation to [facility] to receive the rituximab.  He was transferred to a board and care facility in [place] on 04/16/21.  I last saw him on 04/30/21 and have been trying to move him to a local SNF since then.  Unfortunately, [facility] staff have been unable to find a SNF able to accept him.  He was advised to go to the local ER by Dr. Lorenz Coaster but he couldn't afford the nearly $1000 cost for transportation.      Interval History 09/17/2021  He feels that he is doing better now that he has been getting physical therapy and occupational therapy.  He has more movement of the left hand.  He is doing pull-ups with his trapeze.  Legs are able to stretch to 90 degrees now.  [facility] home health RN and SW have looked for local primary care physicians but there is no one that will accept him.  He called [employer] but they do not find PCPs for patients.  He states that he gets 13 hours of no care during the night at his current location and wants to move to a SNF in [place].  He denies having had recent infections.    He states that he has had 3 pressure sores on his coccyx since being in the current board and care that are now closed.  The home health nurse no longer  comes out now that his wounds are healed.  The home health nurse will be administering the [place] for him.  He gets physical therapy and occupational therapy.  He has only  been getting diazepam 5 mg every 6 hours instead of 10 mg because he would run out.  He now has an updated quantity and prior authorization for the increased quantity.    Current MS symptoms:  His MS symptoms are about the same.  He has some fatigue.  He gets about 6 hours of sleep a night.  He uses caffeine/coffee for fatigue.  He denies cognitive problems.  He has good long term memory.  Denies vision problems other than reading glasses.  No speech or swallowing problems.  He has gained weight.  He has weakness of the right arm and both legs and is now losing the grip in his left hand.  He has significant spasticity and contractures.  He is non-ambulatory.  He has urinary incontinence and has had past UTIs, none recent.  He has constipation and takes preventative medications as well as laxatives prn.  He is now not depressed but sometimes feels that he may not get better.  He has no social interaction.  The care providers give him his medications.     He had a yeast infection in the groin recently that is now resolved.  No other infections reported.  He has been vaccinated with 3 COVID vaccines.    He had a hernia repair surgery on 09/21/20.     He feels that the Ampyra helps with the tightening of the right hand.    The legs are contracted and he hopes to get improvement in mobility (previously had benefit from the Botox at [facility]).  He was seen in PM&R for baclofen pump placement.  He did the trial but had an allergic reaction after the instillation so will not be proceeding with the baclofen pump.  He has an appointment with Dr. Roosevelt Locks on 05/12/21 for evaluation for Botox.    Last MRI done in 2017. Might need large bore or open MRI due to leg contractures.     He had COVID in 07/2019 (Omicron), mild case.     MS History:  The onset of symptoms  was when he was 35 years ago in 2007 with symptoms of lower extremity weakness, progressing to inability to bear weight. Mr. Theurer was previously on copaxone, tysabri and rituximab treatments in the past, all of which have worked well in the past, according to him. He also received multiple botox injections for his contractures before COVID, and that, according to patient, has helped with his symptoms as well. Ampyra was helpful in the past; however, was accidentaly stopped during his transfer from one acute rehab to another as pt believes they lost some records during the transfer. He restarted rituximab by Dr. Blenda Peals in 06/2020, 1000 mg every 6 months. Prior to that infusion, the other infusion was over two years prior. Mr. Mathey said that because of COVID-19 he had difficulty organizing transportation to get infusion at the center. When Mr. Metzgar was on regular infusions, he reported to not have any relapses at that time.  He states that he tried Tecfidera about 5-6 years ago and stopped it due to rash and swelling all over.    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/09/2020    Incisional hernia, without obstruction or gangrene 09/21/2020  Past Medical History:   Diagnosis Date    GERD (gastroesophageal reflux disease)     Hypertension     Immobility     bed/wheelchair bound    Multiple sclerosis (HCC)      Allergies:    Tecfidera [Dimethyl Fumarate]    Rash and Swelling  Tegretol [Carbamazepine]    Hives    Medications:     Current Outpatient Medications:     Acetaminophen (TYLENOL) 500 mg Tablet, Take 2 tablets by mouth every 6 hours., Disp: 100 tablet, Rfl: 0    Amlodipine (NORVASC) 10 mg Tablet, Take 10 mg by mouth every day., Disp: , Rfl:     Baclofen  (LIORESAL) 10 mg Tablet, Take 2 tablets by mouth 4 times daily., Disp: 720 tablet, Rfl: 3    Dalfampridine (AMPYRA) 10 mg ER Tablet, Take 1 tablet by mouth 2 times daily. Needs brand name, the generic didn't work for him., Disp: 60 tablet, Rfl: 11    Diazepam (VALIUM) 5 mg Tablet, Take 2 tablets by mouth every 6 hours. Take around the clock, not as needed. See separate rx for ADDITIONAL doses as needed., Disp: 240 tablet, Rfl: 5    Docusate (COLACE) 100 mg Capsule, TAKE 1 CAPSULE BY MOUTH TWICE A DAY, Disp: 60 capsule, Rfl: 0    Gabapentin (NEURONTIN) 300 mg Capsule, Take 1 capsule by mouth 3 times daily., Disp: 90 capsule, Rfl: 3    Lidocaine (LIDODERM) 5 %(700 mg/patch) Patch, Apply 1 patch to the skin every 24 hours. Apply to painful area (12 hours on, 12 hours off), Disp: 30 patch, Rfl: 0    Magnesium Hydroxide (MILK OF MAGNESIA) 400 mg/5 mL Liquid, Take 30 mL by mouth once daily if needed., Disp: 900 mL, Rfl: 0    Naloxone (NARCAN) 4 mg/actuation Nasal Spray, Use for opioid overdose. Give 1 spray in nostril. If no response / breathing slows down, repeat every 2 minutes until emergency help arrives., Disp: 2 each, Rfl: 0    ofatumumab (KESIMPTA PEN) 20 mg/0.4 mL Pen Injector, Inject 0.4 mL subcutaneously one time each week. On weeks 0, 1 and 2. No dose on week 3., Disp: 1.2 mL, Rfl: 0    ofatumumab (KESIMPTA PEN) 20 mg/0.4 mL Pen Injector, Inject 0.4 mL subcutaneously every 4 weeks. Starting week 4, Disp: 1.2 mL, Rfl: 3    onabotulinumtoxinA (BOTOX INJ), 0 Refill(s), Maintenance, Disp: , Rfl:     Polyethylene Glycol 3350 (MIRALAX) 17 gram Powder, Take 1 packet by mouth every morning. Mix in 4 to 8 oz of water, soda, coffee, juice or tea., Disp: 30 packet, Rfl: 0    RiTUXimab (RITUXAN) - Infusion Med Placeholder, This is a placeholder order to keep the medication list accurate. See therapy plan for details., Disp: , Rfl:     RITUXIMAB 10 mg/mL Injection, 100 mL by IV route every 2 weeks. Send orders to home  health. Rituximab 1000 mg x 2 doses, on Day 1 and Day 15, IV every 6 months.  Next dose due 07/07/21.  Administer according to prescribing information.  Premedications to be administered at least 30 minutes prior to start of Rituximab infusion:  - IV methylprednisolone 100 mg  - PO acetaminophen 650 mg  - PO benadryl 25 mg   Hypersensitivity medications:  - IV benadryl 25-50 mg every 6 hours as needed for itching/allergic reaction  - PO famotidine 25 mg x 1 if needed in addition to benadryl for itching/allergic reaction  - PO acetaminophen 650  mg every 4 hours if needed (not to exceed 3000 mg total daily dose) for headache  - IV methylprednisolone 125 mg x 1 for allergic reaction refractory to benadryl or for more severe allergic reactions, Disp: 2 each, Rfl: 1    Tizanidine (ZANAFLEX) 2 mg capsule, See Instructions, 1 CAP AT HS X 3 DAYS, THEN 1 CAP BID X 3 DAYS AND THEN 1 CAP TID THEREAFTER., # 90 Cap, 3 Refill(s), Maintenance, Pharmacy: [facility], 185.42, cm, 05/10/19 13:58:00 PDT, Height/Length (cm), 70.31, kg, 06/24/19 16:33:00 PST, Dose..., Disp: , Rfl:     VITAMIN D PO, Take 1,000 Units by mouth every morning., Disp: , Rfl:     Social history:  He lives in a board and care facility in [place].  He reports smoking in the past, but stopped at the age of 50. No current alcohol or other recreational drug use.   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    Family history:   No family history of multiple sclerosis in family members. Young sister diagnosed with lupus at the age of 46.     EXAMINATION:  Neuro Exam:    Mental Status: Alert and oriented  State: Calm  MMSE:  Not performed  Use of language:  Fluent, normal    He is laying down in a hospital bed with head up.  I am only able to see his face.    Labs/Imaging:   Normal CBC and CMP 09/29/20 except mild anemia.     [facility] labs from 03/2016:  Hep B sAb negative  Hep B sAg negative  Hep B cAb negative  Hep C negative  HIV negative    Labs done  at [facility] on 01/19/21:  Hepatitis B and C serologies normal  Quantiferon gold TB neg  IgG and IgM normal  CMP normal  CD19 <1  CBC  unremarkable    Labs done 09/01/2021:  CBC, CMP both unremarkable.  IgG, IgA, IgM all normal.  Hepatitis C and B serologies negative.    Assessment / Plan:  In summary, JACARI IANNELLO III is a 51yr old male with a previous diagnosis of multiple sclerosis, advanced, now on rituximab. Severe LE contractures, spasticity, pain. RUE weakness that has improved recently since restarting rituximab, possible early contractures at elbow and shoulder. LUE with weakness that is worse since surgery. His contractures are severe, painful and prevent mobility because he is limited to laying down on a gurney.  He requires the level of care provided at a SNF.  Olivia Canter, LCSW and [facility] hospital  transfer nurses are working to get him back to a SNF in [place] and for medical transportation to medical appointments that are covered by his insurance.  This was anticipated to happen soon but unfortunately there are no SNFs that will accept him currently and he was unable to go to the local emergency room due to the cost of an ambulance.  Home health to administer Kesimpta at the board and care since he is unable to get rituximab at the board and care because it is not licensed to have IV medications given there.  Home health to continue physical therapy, occupational therapy, and social services until he is transferred to a SNF.      #MS   - advanced, plan to start [place].  I have asked Kinnie Feil, pharmacist to assist with transition.    - Continue Ampyra 10 mg bid for weakness  in the arms and legs  - recommend a second COVID booster since he is immunosuppressed    #Spasticity / contractures.   - continue diazepam 10 mg four times per day (every 6 hours) for spasticity  - continue 5 mg tab, 1-2 diazepam up to 4 times per day as needed for spasticity, to relax muscles for a shower / bath,  transfers or during therapy.  - continue baclofen 20 mg four times per day   - Referred to Dr. Roosevelt Locks at [facility] for Botox injections due to severe spasticity of LEs.  Currently unable to get to clinic.  - continue passive ROM with PT and the staff at the board and care.   - continue gabapentin to 300 mg TID for pain    #Home Health:  - wound care-monitor stage 1 red area on buttocks  - home health stretching, strengthening, contracture prevention  - PT and OT to also train board and care staff  - Nursing assessment, medication administration of Kesimpta    Follow-up in 2 months with Dr. Lorenz Coaster to check status of transfer to SNF and medication change status.    Discussed case with Dr. Lorenz Coaster who agrees with this plan of care.    Total time I spent in care of this patient today (excluding time spent on other billable services) was 60 minutes.      Sincerely,    Burnadette Peter, MSN, FNP-C  Neurology

## 2021-09-17 NOTE — Patient Instructions (Signed)
Thank you for visiting the Clinical Neurosciences Department at the [facility] today.    The neurologist(s) you saw today was(were):   Nurse Practitioner: Burnadette Peter NP     Recommendations made during today's visit include:       The following diagnostic tests have been ordered to help [place] better determine the underlying cause of your condition as well as guide best treatment. :        The following changes to your medication regimen have been made:    Start Kesimpta    The following referrals have been generated through the [facility] system:         An open line of communication between you, your family, and [place] (your neurology team) is important to [place], both for safety and maintaining the highest quality of patient care.  --------------------------------------------------------------------------------------------------------    If you have any questions, please feel free to ask [place]!    1. By telephone.  You can reach your resident neurologist by calling the [place] neurology clinic at [phone].    2. Online. We also encourage you to register for [employer]," which allows you online computer access to your medical chart, as well as an opportunity to e-mail your doctor regarding non-urgent issues.  You can sign up by visiting the following link: "[email or web address]".    Thank you for choosing the Neurology department at the [facility].  It was a pleasure to participate in your care today    Sincerely,     Burnadette Peter, NP-BC

## 2021-09-20 ENCOUNTER — Other Ambulatory Visit: Payer: Self-pay

## 2021-09-20 NOTE — Progress Notes (Signed)
[place]: Initial Pharmacist Assessment    Roy Henry is a 51yrold patient with a diagnosis of MS for which patient has been prescribed Kesimpta.    Spoke with patient in telephone call.    The patient's demographics, comorbidities, allergies, relevant vaccinations, and past medical history were reviewed by the pharmacist on 09/20/2021.     Medication Reconciliation:  Patient's prescription and non-prescription medications and natural supplements were reviewed with the patient/patient's caregiver and medication list updated by pharmacist on 09/20/21.       Medication adherence:  Medications are primarily managed by the patient's caregiver    Can Roy Henry self-administer the Kesimpta? no - orders to be sent to home health for assistance administering medication     Therapeutic goals based on possible outcomes of therapy: Relapse reduction, symptom control, and avoidance of disease progression      Patient Education:  Initial medication counseling was completed prior to administration of first dose.    Counseled on the treatment expectations, possible outcomes of therapy, and potential adverse effects and how to prevent, minimize, and manage.  Also provided education on contraindications and safety precautions.  Counseled on timely and proper administration of medication and what to do if a dose is missed.  Discussed importance of taking this medication regularly as prescribed and possible consequences of medication non-adherence.    Counseled on potential duration of therapy: longterm  Counseled on relevant vaccinations needed prior to therapy initiation and continued treatment.  Reviewed how to store and handle medication and how to dispose of unused/expired medication.  Reassessment due in approximately 6 months     Explained follow-up process and instructed to contact clinical pharmacist for questions at ([phone] option #9 for a Specialty Coordinator, then option #4.  All  questions were answered, and patient verbalized readiness to proceed with plan as discussed.       JWilfred Lacy PharmD, CHunter MPH  Clinical Specialty Pharmacist   PHN [phone] [phone]      JKinnie Feil 09/20/2021 2:50 PM

## 2021-09-20 NOTE — Progress Notes (Signed)
[facility] Initial Fill     Discussed the following with the patient:  Performed medication history review and medication list updated; pending review and approval by pharmacist.  Welcome Packet was provided with first order, and includes Patient Bill of Rights and Responsibilities, Privacy Notice, proper disposal of unused/expired medication and other information.  Specialty Pharmacy's hours of operation (Monday-Friday, excluding University holidays, 09:00-17:00)  For any complaints the patient may have about his/her experiences with Specialty Pharmacy, patient was advised to call [phone] or toll-free at [phone], or email   [email or web address].  Advised patient that more information about the complaint process can be found in the Welcome Packet.  Provided information about copay assistance and/or patient assistance programs which may be available to help lower out-of-pocket costs.    A clinical pharmacist is available to discuss medication concerns or questions.  Patient accepted offer to speak with pharmacist at this time, and was advised that during pharmacy's "closed" hours, a pharmacist is available to discuss urgent medication-related concerns or questions.    Bronc Brosseau Reynolds, PharmD, CSP, MPH  Clinical Specialty Pharmacist   PHN [phone] ext [phone]

## 2021-09-20 NOTE — Progress Notes (Signed)
Specialty Pharmacy Telephone Encounter: Medication Fill [place] III  [id number]    Called patient to coordinate medication(s) to be filled at [facility]:    Kesimpta Pen '20mg'$ /0.4 ml # 1.2 ml    Telephone follow-up will be scheduled prior to the next refill due date, but patient is encouraged to contact pharmacy sooner if any issues arise.     Ship Date Month/Day Initials/Program Carrier [employer] Option Tracking Number   3/7 MV/Neuro  [employer] Overnight No Signature Required  7714 [phone] (Handbook, Magnet, Tote)  Sport and exercise psychologist Person:   Patient   Number of Supplies Left in Days: New start     Credit Card on File:   N/A- $0 Copay    Twana First  Specialty Technician  Specialty Pharmacy   P: [phone] Option 9 then  #4  09/20/21  2:40 PM

## 2021-09-20 NOTE — Progress Notes (Signed)
Reason for Telephone: Medication Adherence Follow-up  Kesimpta Pen 20 mg/0.26m      Reviewed Home Medication List: , no new changes in medications were noted.    Med Adherence Screen (Number doses missed/Number tablets remaining): Patient notes that they have not missed any doses. New Startt     Adverse Drug Events/Toxicities: None     Have you had any hospitalizations relate to your MS in the last 30 days? None     Have you had any steroids for your MS in the last 30 days? No    Telephone follow-up in 28 days, but patient is encouraged to contact pharmacist sooner if any issue arises    Additional Notes: Medication to be delivered on 09/22/21      MBethel [phone] 9 then  #4  09/20/21  2:38 PM

## 2021-09-20 NOTE — Progress Notes (Signed)
Called and spoke to pt, counseled on medication dosing, side effects, storage, disposal, and precautions. [facility] him. Confirmed delivery address in demographics for Wednesday 09/22/21 delivery.     Pt requested update on alternative care facility status, noting he has been in current facility in [place] for 5 months, has had to pay $6k out of pocket and is not getting good care. Pt notes he is to be turned every 2hrs to avoid bedsores and that most of the time he is left lying flat on his back. Pt reports having 6 bedsores since being at current facility. Pt requests facility closer to [place] so he can continue to get Botox in his leg. Advised that I would ask Janelle ot fup with him on status since I was not aware of where they were in the process.    Pt verbalized understanding of information. Instructed pt to call back with any non-urgent questions or concerns.     Roy Henry, PharmD, CSP, MPH  Clinical Specialty Pharmacist   PHN [phone] ext [phone]

## 2021-09-21 ENCOUNTER — Telehealth: Payer: Self-pay | Admitting: Family

## 2021-09-21 ENCOUNTER — Other Ambulatory Visit: Payer: Self-pay

## 2021-09-21 NOTE — Telephone Encounter (Signed)
I am trying to connect with [facility] to determine if they are providers accepting new patients with Medicare/Medi-Cal.  I am waiting for a return call.

## 2021-09-21 NOTE — Telephone Encounter (Signed)
Ivor Reining called with [name] on the line requesting a referral back to a SNF  near [facility]  Patient is unable to find Dr near him and prefers in person appt and is not able to  get transportation to [place] where he is at now. Please advise

## 2021-09-22 ENCOUNTER — Other Ambulatory Visit: Payer: Self-pay | Admitting: Family

## 2021-09-22 DIAGNOSIS — G35 Multiple sclerosis: Secondary | ICD-10-CM

## 2021-09-22 DIAGNOSIS — Z1152 Encounter for screening for COVID-19: Secondary | ICD-10-CM

## 2021-09-23 ENCOUNTER — Other Ambulatory Visit: Payer: Self-pay | Admitting: Family

## 2021-09-23 ENCOUNTER — Other Ambulatory Visit: Payer: Self-pay

## 2021-09-23 DIAGNOSIS — G35 Multiple sclerosis: Secondary | ICD-10-CM

## 2021-09-23 NOTE — Telephone Encounter (Signed)
SARS-CoV-2 Antigen order faxed to [facility] as requested [phone], fax confirmation received.    Windell Hummingbird, RN  Neurology

## 2021-09-23 NOTE — Progress Notes (Addendum)
TOC: Direct Admit to SNF: Transition Follow Up  Late Entry    September 17, 2021: The [facility] department was requested to re-visit a referral sent to the department in 04/2022. At the time, the [facility] SNF did not have availability. Since then the patient has remained in the [facility] Care.     March 7,2023: under the direction of TOC Management, the patient was referred to [facility] as well as non-collaborative. [facility] SNF has accepted the patient. Due to the location, transportation has been arranged and is currently on "Will Call" pending SNF Orders from the neurology team.      September 22, 2021: [facility] was contacted to confirm continued acceptance of the patient. They again requested SNF Orders, updated clinical information and Confirmation of COVID-19 testing. SNF Order are yet pending however, updated clinical information has been faxed to the [facility] department. Transportation actively on Will Call status.     September 23, 2021: The writer has contacted the patient to confirm acceptance of the transition plan. The patient is agreeable and expressed his gratitude for assisting in transitioning into a [facility] facility.  Regarding the B&C, the patient states he pays month to month and has not currently paid for the month of March as of date. He is not able to pay for the transportation and has requested assistance in that area. The patient has a motorized wheelchair and would like it to accompany him to the facility. This issue has yet to be address.   1400: Currently waiting on the completion of SNF Orders.       March 03/2022: 1544: Currently pending SNF Orders. Will continue to follow. Will continue to hold transportation on Will Call.

## 2021-09-23 NOTE — Progress Notes (Addendum)
[facility] [facility] [place]  PATIENT: Roy Henry  MRN:  [id number]  DOB:  10/16/1970    Date, time: 09/23/2021 @ 10:35 AM    1. Discharged to SNF on (date/time): No discharge date for patient encounter.  2. Primary Diagnosis: No admission diagnoses are documented for this encounter. 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 Rehabilitation Potential: Poor  4. CPR status: <no information> full code   Supportive documentation provided in 09/17/2021   5. Decisional capacity: He has capacity to have capacity to understand and sign admission agreement, to participate in plan of care and to make health care decisions due to diagnosis of multiple sclerosis.  6. Patient has been informed of medical diagnosis: yes     7. Responsible Party / Conservator:    Phone number: Home: [phone]   Work:    35. Allergies:     Tecfidera [Dimethyl Fumarate]    Rash and Swelling  Tegretol [Carbamazepine]    Hives    9. Isolation type / location:     10. Diet: PO Feedings - regular   11. Activity restriction: As tolerated.     12. Therapies: (evaluate and treat  Physical therapy -  yes Occupational therapy-  yes Speech therapy-  no   Respiratory therapy -   no Continuous pulse ox -  no Incentive spirometry-  no   13. Laboratory tests: CBC, IgG, IgM, CMP every 6 months   Follow up results to: Dr. Briscoe Deutscher at [facility] fax [phone]  14. IV therapy type (iv, PICC, central, other): N/A   Fluids:    Antibiotics:   15. Vital signs: Routine (q shift x 3 days, then weekly)  16. Weight: routine on admission, then monthly  17.TB Screen: PPD done -  no Date:      CXR done - no Date:   10. Type of restraint used in acute care:  none   19. Wound care:  Pressure sores on coccyx.  RN to assess and provide necessary wound care per facility protocol.  20. Bowel care: See medication list.  21. Scheduled follow up appointments:   No future appointments.   Primary care: He needs to be set up with a primary care physician  [facility] specialty care: Neurology  Dialysis clinic (name, number, days) N/A    22. Medications and treatments:     Medication List            Accurate as of September 23, 2021 10:35 AM. If you have any questions, ask your nurse or doctor.                CONTINUE taking these medications      Acetaminophen 500 mg Tablet  Commonly known as: TYLENOL  Take 2 tablets by mouth every 6 hours.     Amlodipine 10 mg Tablet  Commonly known as: NORVASC  Take 10 mg by mouth every day.     Baclofen 10 mg Tablet  Commonly known as: LIORESAL  Take 2 tablets by mouth 4 times daily.     Biotin  10,000 mcg Capsule  Take by mouth every day.     BOTOX INJ  0 Refill(s), Maintenance     Dalfampridine 10 mg ER Tablet  Commonly known as: AMPYRA  Take 1 tablet by mouth 2 times daily. Needs brand name, the generic didn't work for him.     Diazepam 5 mg Tablet  Commonly known as: VALIUM  Take 2 tablets by mouth every 6 hours. Take around the clock, not as needed. See separate rx for ADDITIONAL doses as needed.     Docusate 100 mg Capsule  Commonly known as: COLACE  TAKE 1 CAPSULE BY MOUTH TWICE A DAY     Gabapentin 300 mg Capsule  Commonly known as: NEURONTIN  Take 1 capsule by mouth 3 times daily.     * KESIMPTA PEN 20 mg/0.4 mL Pen Injector  Generic drug: ofatumumab  Inject 0.4 mL subcutaneously one time each week. On weeks 0, 1 and 2. No dose on week 3.     * KESIMPTA PEN 20 mg/0.4 mL Pen Injector  Generic drug: ofatumumab  Inject 0.4 mL subcutaneously every 4 weeks. Starting week 4     Lidocaine 5 %(700 mg/patch) Patch  Commonly known as: LIDODERM  Apply 1 patch to the skin every 24  hours. Apply to painful area (12 hours on, 12 hours off)     Magnesium Hydroxide 400 mg/5 mL Liquid  Commonly known as: MILK OF MAGNESIA  Take 30 mL by mouth once daily if needed.     Naloxone 4 mg/actuation Nasal Spray  Commonly known as: NARCAN  Use for opioid overdose. Give 1 spray in nostril. If no response / breathing slows down, repeat every 2 minutes until emergency help arrives.     Polyethylene Glycol 3350 17 gram Powder  Commonly known as: MIRALAX  Take 1 packet by mouth every morning. Mix in 4 to 8 oz of water, soda, coffee, juice or tea.     Tizanidine 2 mg capsule  Commonly known as: ZANAFLEX  See Instructions, 1 CAP AT HS X 3 DAYS, THEN 1 CAP BID X 3 DAYS AND THEN 1 CAP TID THEREAFTER., # 90 Cap, 3 Refill(s), Maintenance, Pharmacy: [facility], 185.42, cm, 05/10/19 13:58:00 PDT, Height/Length (cm), 70.31, kg, 06/24/19 16:33:00 PST, Dose...     * VITAMIN D PO  Take 1,000 Units by mouth every morning.     * VITAMIN D PO  Take 5,000 Int'l Units by mouth every day.           * This list has 4 medication(s) that are the same as other medications prescribed for you. Read the directions carefully, and ask your doctor or other care provider to review them with you.                STOP taking these medications      Rituximab 10 mg/mL Injection  Commonly known as: RITUXIMAB  Stopped by: Lorne Skeens, NP            Tylenol 650 mg po q4 hours PRN mild pain    23. If you smoke, STOP.  24. Foley in place?:no   If present, routine foley care PRN.    Electronically signed by:  Briscoe Deutscher, MD, PhD  Pager [phone]

## 2021-09-24 ENCOUNTER — Other Ambulatory Visit: Payer: Self-pay

## 2021-09-24 ENCOUNTER — Other Ambulatory Visit: Payer: Self-pay | Admitting: Family

## 2021-09-24 NOTE — Progress Notes (Signed)
[facility] [place]  PATIENT:       Roy Henry  MRN:               [id number]  DOB:               1970-12-18     Date, time: 09/23/2021 @ 10:35 AM     1. Discharged to SNF on (date/time): No discharge date for patient encounter.  2. Primary Diagnosis: No admission diagnoses are documented for this encounter. 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 Rehabilitation Potential: Poor  4. CPR status: <no information> full code              Supportive documentation provided in 09/17/2021   5. Decisional capacity: He has capacity to have capacity to understand and sign admission agreement, to participate in plan of care and to make health care decisions due to diagnosis of multiple sclerosis.  6. Patient has been informed of medical diagnosis: yes                7. Responsible Party / Conservator:               Phone number: Home: [phone]            Work:    8. Allergies:      Tecfidera [Dimethyl Fumarate]    Rash and Swelling  Tegretol [Carbamazepine]    Hives     9. Isolation type / location:     10. Diet: PO Feedings - regular              11. Activity restriction: As tolerated.     12. Therapies: (evaluate and treat  Physical therapy -  yes Occupational therapy-  yes Speech therapy-  no   Respiratory therapy -   no Continuous pulse ox -  no Incentive spirometry-  no   13. Laboratory tests: CBC, IgG, IgM, CMP every 6 months              Follow up results to: Dr. Briscoe Deutscher at [facility] fax [phone]  14. IV therapy type (iv, PICC, central, other): N/A              Fluids:               Antibiotics:   15. Vital  signs: Routine (q shift x 3 days, then weekly)  16. Weight: routine on admission, then monthly  17.TB Screen: PPD done - no            Date:                           CXR done - no            Date:   80. Type of restraint used in acute care:  none   19. Wound care:  Pressure sores on coccyx.  RN to assess and provide necessary wound care per facility protocol.  20. Bowel care: See  medication list.  21. Scheduled follow up appointments:   No future appointments.   Primary care: He needs to be set up with a primary care physician  [facility] specialty care: Neurology  Dialysis clinic (name, number, days) N/A     22. Medications and treatments:      Medication List               Accurate as of September 23, 2021 10:35 AM. If you have any questions, ask your nurse or doctor.                    CONTINUE taking these medications       Acetaminophen 500 mg Tablet  Commonly known as: TYLENOL  Take 2 tablets by mouth every 6 hours.      Amlodipine 10 mg Tablet  Commonly known as: NORVASC  Take 10 mg by mouth every day.      Baclofen 10 mg Tablet  Commonly known as: LIORESAL  Take 2 tablets by mouth 4 times daily.      Biotin 10,000 mcg Capsule  Take by mouth every day.      BOTOX INJ  0 Refill(s), Maintenance      Dalfampridine 10 mg ER Tablet  Commonly known as: AMPYRA  Take 1 tablet by mouth 2 times daily. Needs brand name, the generic didn't work for him.      Diazepam 5 mg Tablet  Commonly known as: VALIUM  Take 2 tablets by mouth every 6 hours. Take around the clock, not as needed. See separate rx for ADDITIONAL doses as needed.      Docusate 100 mg Capsule  Commonly known as: COLACE  TAKE 1 CAPSULE BY MOUTH TWICE A DAY      Gabapentin 300 mg Capsule  Commonly known as: NEURONTIN  Take 1 capsule by mouth 3 times daily.      * KESIMPTA PEN 20 mg/0.4 mL Pen Injector  Generic drug: ofatumumab  Inject 0.4 mL subcutaneously one time each week. On weeks 0, 1 and 2. No dose on week 3.      * KESIMPTA PEN 20 mg/0.4 mL Pen  Injector  Generic drug: ofatumumab  Inject 0.4 mL subcutaneously every 4 weeks. Starting week 4      Lidocaine 5 %(700 mg/patch) Patch  Commonly known as: LIDODERM  Apply 1 patch to the skin every 24 hours. Apply to painful area (12 hours on, 12 hours off)      Magnesium Hydroxide 400 mg/5 mL Liquid  Commonly known as: MILK OF MAGNESIA  Take 30 mL by mouth once daily if needed.      Naloxone 4 mg/actuation Nasal Spray  Commonly known as: NARCAN  Use for opioid overdose. Give 1 spray in nostril. If no response / breathing slows down, repeat every 2 minutes until emergency help arrives.      Polyethylene Glycol 3350 17 gram Powder  Commonly known as: MIRALAX  Take 1 packet by mouth every morning. Mix in 4 to 8 oz of water, soda, coffee, juice or tea.      Tizanidine 2 mg capsule  Commonly known as: ZANAFLEX  See Instructions, 1 CAP AT HS X 3 DAYS, THEN 1 CAP BID X 3 DAYS AND THEN 1 CAP TID THEREAFTER., # 90 Cap, 3 Refill(s), Maintenance, Pharmacy: [facility], 185.42, cm, 05/10/19 13:58:00 PDT, Height/Length (cm), 70.31, kg, 06/24/19 16:33:00 PST, Dose...      * VITAMIN D PO  Take 1,000 Units  by mouth every morning.      * VITAMIN D PO  Take 5,000 Int'l Units by mouth every day.              * This list has 4 medication(s) that are the same as other medications prescribed for you. Read the directions carefully, and ask your doctor or other care provider to review them with you.                    STOP taking these medications       Rituximab 10 mg/mL Injection  Commonly known as: RITUXIMAB  Stopped by: Lorne Skeens, NP                Tylenol 650 mg po q4 hours PRN mild pain     23. If you smoke, STOP.  24. Foley in place?:no              If present, routine foley care PRN.    25.  Patient not meeting the Medicare requirement of 3 consecutive qualifying inpatient hospital days, necessary to qualify for SNF placement. Patient qualifies for a COVID Waiver, which allows patient to qualify for SNF placement,  regardless of whether the Medicare requirement of 3 consecutive qualifying inpatient hospital days has been met.      Per the public health emergency declared under Section 520 of the Public Health Service Act, the hospital is utilizing the waivers under section 1135 of the Social Security Act to increase hospital capacity. In order to provide care to more seriously ill patients and increase hospital capacity as approved by the Secretary of Health and Dentist pursuant to section 1812(f) of the Social Security Act, the hospital is discharging this Medicare Part A patient under the following situations:  o This patient is without a 3-day inpatient hospital stay, and/or  o This patient has recently exhausted SNF benefit without establishment of a new SNF benefit period      Electronically signed by:  Briscoe Deutscher, MD, PhD  Pager [phone]

## 2021-09-24 NOTE — Progress Notes (Signed)
TOC: SNF Direct Admission Transportation   Board and Care to SNF    September 24, 2021    The patient's transportation has been scheduled 5:00 pm today 09/24/2021 by Truett Perna Lindell Noe).   The Board and Care location: [facility] [place] [facility] [place]  [facility] SNF: [address] [place] [facility] [place] [phone] Destination.   The patient has been notified and is agreeable to the transfer. [facility] also been notified. Will sign off the case at this time unless further needed.     Donney Rankins  [facility] Management  Transitions of [facility]                   [address], [place], [place]. [address]  [phone]

## 2021-09-27 ENCOUNTER — Other Ambulatory Visit: Payer: Self-pay

## 2021-09-27 ENCOUNTER — Telehealth: Payer: Self-pay | Admitting: Family

## 2021-09-27 NOTE — Progress Notes (Signed)
TOC: SNF Direct Admit Follow Up    September 27, 2021        A follow up call to [facility] was made to verify how the patient was transitioning in the facility. Per the admission team,the patient is doing very well. The only request they have is the verbiage for the waiver is sent to them sooner than later. The request has been sent to NP Burnadette Peter to complete the documentation 09/27/2021. As of date, the writer does not have access to the documentation to send to the requesting facility. Will continue to follow until completion.     Roy Henry  [facility] Management  Transitions of [facility]                   [address] [place], [place]. [address]  [phone]

## 2021-09-27 NOTE — Telephone Encounter (Signed)
Signed  Encounter Date: 09/24/2021     Skilled Nursing Home Physician's Orders  [facility]  PATIENT:       Roy Henry  MRN:               [id number]  DOB:               1970/09/04     Date, time: 09/23/2021 @ 10:35 AM     1. Discharged to SNF on (date/time): No discharge date for patient encounter.  2. Primary Diagnosis: No admission diagnoses are documented for this encounter. 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 Rehabilitation Potential: Poor  4. CPR status: <no information> full code              Supportive documentation provided in 09/17/2021   5. Decisional capacity: He has capacity to have capacity to understand and sign admission agreement, to participate in plan of care and to make health care decisions due to diagnosis of multiple sclerosis.  6. Patient has been informed of medical diagnosis: yes                7. Responsible Party / Conservator:               Phone number: Home: [phone]            Work:    8. Allergies:      Tecfidera [Dimethyl Fumarate]    Rash and Swelling  Tegretol [Carbamazepine]    Hives     9. Isolation type / location:     10. Diet: PO Feedings - regular              11. Activity restriction: As tolerated.     12. Therapies: (evaluate and treat  Physical therapy -  yes Occupational therapy-  yes Speech therapy-  no   Respiratory therapy -   no Continuous pulse ox -  no Incentive spirometry-  no   13. Laboratory tests: CBC, IgG, IgM, CMP every 6 months              Follow up results to: Dr. Briscoe Deutscher at [facility] fax [phone]  14. IV therapy type (iv, PICC, central, other): N/A               Fluids:               Antibiotics:   15. Vital signs: Routine (q shift x 3 days, then weekly)  16. Weight: routine on admission, then monthly  17.TB Screen: PPD done - no            Date:                           CXR done - no            Date:   86. Type of restraint used in acute care:  none   19. Wound care:  Pressure sores on coccyx.  RN to assess and provide  necessary wound care per facility protocol.  20. Bowel care: See medication list.  21. Scheduled follow up appointments:   No future appointments.   Primary care: He needs to be set up with a primary care physician  [facility] specialty care: Neurology  Dialysis clinic (name, number, days) N/A     22. Medications and treatments:      Medication List               Accurate as of September 23, 2021 10:35 AM. If you have any questions, ask your nurse or doctor.                    CONTINUE taking these medications       Acetaminophen 500 mg Tablet  Commonly known as: TYLENOL  Take 2 tablets by mouth every 6 hours.      Amlodipine 10 mg Tablet  Commonly known as: NORVASC  Take 10 mg by mouth every day.      Baclofen 10 mg Tablet  Commonly known as: LIORESAL  Take 2 tablets by mouth 4 times daily.      Biotin 10,000 mcg Capsule  Take by mouth every day.      BOTOX INJ  0 Refill(s), Maintenance      Dalfampridine 10 mg ER Tablet  Commonly known as: AMPYRA  Take 1 tablet by mouth 2 times daily. Needs brand name, the generic didn't work for him.      Diazepam 5 mg Tablet  Commonly known as: VALIUM  Take 2 tablets by mouth every 6 hours. Take around the clock, not as needed. See separate rx for ADDITIONAL doses as needed.      Docusate 100 mg Capsule  Commonly known as: COLACE  TAKE 1 CAPSULE BY MOUTH TWICE A DAY      Gabapentin 300 mg Capsule  Commonly known as: NEURONTIN  Take 1 capsule by mouth 3 times daily.      * KESIMPTA PEN 20 mg/0.4 mL Pen Injector  Generic drug: ofatumumab  Inject 0.4 mL subcutaneously one time each week. On weeks 0, 1 and 2. No dose on week 3.      *  KESIMPTA PEN 20 mg/0.4 mL Pen Injector  Generic drug: ofatumumab  Inject 0.4 mL subcutaneously every 4 weeks. Starting week 4      Lidocaine 5 %(700 mg/patch) Patch  Commonly known as: LIDODERM  Apply 1 patch to the skin every 24 hours. Apply to painful area (12 hours on, 12 hours off)      Magnesium Hydroxide 400 mg/5 mL Liquid  Commonly known as: MILK OF MAGNESIA  Take 30 mL by mouth once daily if needed.      Naloxone 4 mg/actuation Nasal Spray  Commonly known as: NARCAN  Use for opioid overdose. Give 1 spray in nostril. If no response / breathing slows down, repeat every 2 minutes until emergency help arrives.      Polyethylene Glycol 3350 17 gram Powder  Commonly known as: MIRALAX  Take 1 packet by mouth every morning. Mix in 4 to 8 oz of water, soda, coffee, juice or tea.      Tizanidine 2 mg capsule  Commonly known as: ZANAFLEX  See Instructions, 1 CAP AT HS X 3 DAYS, THEN 1 CAP BID X 3 DAYS AND THEN 1 CAP TID THEREAFTER., # 90 Cap, 3 Refill(s), Maintenance, Pharmacy: [facility], 185.42, cm, 05/10/19 13:58:00 PDT, Height/Length (cm), 70.31, kg, 06/24/19 16:33:00 PST, Dose...      *  VITAMIN D PO  Take 1,000 Units by mouth every morning.      * VITAMIN D PO  Take 5,000 Int'l Units by mouth every day.              * This list has 4 medication(s) that are the same as other medications prescribed for you. Read the directions carefully, and ask your doctor or other care provider to review them with you.                    STOP taking these medications       Rituximab 10 mg/mL Injection  Commonly known as: RITUXIMAB  Stopped by: Lorne Skeens, NP                Tylenol 650 mg po q4 hours PRN mild pain     23. If you smoke, STOP.  24. Foley in place?:no              If present, routine foley care PRN.     25.  Patient not meeting the Medicare requirement of 3 consecutive qualifying inpatient hospital days, necessary to qualify for SNF placement. Patient qualifies for a COVID Waiver, which allows patient to  qualify for SNF placement, regardless of whether the Medicare requirement of 3 consecutive qualifying inpatient hospital days has been met.      Per the public health emergency declared under Section 462 of the Public Health Service Act, the hospital is utilizing the waivers under section 1135 of the Social Security Act to increase hospital capacity. In order to provide care to more seriously ill patients and increase hospital capacity as approved by the Secretary of Health and Dentist pursuant to section 1812(f) of the Social Security Act, the hospital is discharging this Medicare Part A patient under the following situations:  o This patient is without a 3-day inpatient hospital stay, and/or  o This patient has recently exhausted SNF benefit without establishment of a new SNF benefit period        Electronically signed by:  Briscoe Deutscher, MD, PhD  Pager [phone]

## 2021-09-30 ENCOUNTER — Other Ambulatory Visit: Payer: Self-pay

## 2021-09-30 NOTE — Progress Notes (Signed)
SNF Direct Admit: Non-Contracted [facility]    March 16,2023      The 3 Day Waiver has been completed and faxed to [facility] [phone]). The writer will officially sign off the case at this time.       Donney Rankins  [facility] Coordinator  [facility]     [address], [place], [place]. [address]

## 2021-10-05 ENCOUNTER — Other Ambulatory Visit: Payer: Self-pay

## 2021-10-05 NOTE — Progress Notes (Signed)
Specialty Pharmacy Telephone Encounter: Medication Fill [place] III  [id number]    Called patient to coordinate medication(s) to be filled at [place]:    Kesimpta Pen 20 mg/0.31m    Telephone follow-up will be scheduled prior to the next refill due date, but patient is encouraged to contact pharmacy sooner if any issues arise.     Ship Date Month/Day Initials/Program Carrier Method Signature Option Tracking Number   3/22 MV/Neuro FedEx Overnight No Signature Required  [id number]        Contact Person:   Patient   Number of Supplies Left in Days: 14    Credit Card on File:   N/A- $0 Copay    MTwana First Specialty Technician  Specialty Pharmacy   P: [phone] 9 then  #4  10/05/21  1:04 PM

## 2021-10-05 NOTE — Progress Notes (Signed)
Reason for Telephone: Medication Adherence Follow-up    Kesimpta Pen 20 mg/0.70m    Reviewed Home Medication List: , no new changes in medications were noted.    Med Adherence Screen (Number doses missed/Number tablets remaining): Patient notes that they have not missed any doses. 1 pen     Adverse Drug Events/Toxicities: None     Have you had any hospitalizations relate to your MS in the last 30 days? None     Have you had any steroids for your MS in the last 30 days? No    Telephone follow-up in 28 days, but patient is encouraged to contact pharmacist sooner if any issue arises    Additional Notes: Medication to be delivered on 10/07/21  '  [place] [id number] 9 then  #4  10/05/21  1:01 PM

## 2021-10-06 ENCOUNTER — Other Ambulatory Visit: Payer: Self-pay

## 2021-10-07 NOTE — Progress Notes (Signed)
Called pt to fup on request to discuss alternative injection site, pt notes that that he has done injections in both arms for far and wanted to know if he could use his thigh. Advise that he could use thigh, arms and abdomen. Pt noted he is doing well, was moved to a care facility in [place] and is already seeing great improvement with care he is able to get at current facility.     Wilfred Lacy, PharmD, CSP, MPH  Clinical Specialty Pharmacist   PHN [phone] ext 4

## 2021-10-12 ENCOUNTER — Other Ambulatory Visit: Payer: Self-pay

## 2021-10-12 ENCOUNTER — Other Ambulatory Visit: Payer: Self-pay | Admitting: Family

## 2021-10-13 ENCOUNTER — Other Ambulatory Visit: Payer: Self-pay

## 2021-10-13 NOTE — Telephone Encounter (Signed)
Refill for loading dose not appropriate. Order for MD already on file with pharmacy.     Wilfred Lacy, PharmD, CSP, MPH  Clinical Specialty Pharmacist   PHN [phone] ext [phone]

## 2021-11-08 ENCOUNTER — Encounter: Payer: Self-pay | Admitting: Family

## 2021-11-08 ENCOUNTER — Other Ambulatory Visit: Payer: Self-pay | Admitting: Family

## 2021-11-08 DIAGNOSIS — M24561 Contracture, right knee: Secondary | ICD-10-CM

## 2021-11-08 NOTE — Telephone Encounter (Signed)
From: [name]  To: Lorne Skeens, NP  Sent: 11/08/2021 11:26 AM PDT  Subject: Broken leg    It was unbelievable Roy Henry last Monday during Physical therapy my femur got broken. I got surgery and it was fixed but it exposed that I have had a broken hip for the past two years. I went to [facility] for the procedure and they are the same ones that did the diagnosis on my hernias, but the second time I went there in 2021 was for my hip because it hurt and they said it was added hernias but upon further review last week they noticed that I had a broken hip the time I was there complaining about hip pain? I have an appointment on May 17 and I should be able to make it

## 2021-12-01 ENCOUNTER — Ambulatory Visit: Payer: Medicare Other | Admitting: Neurology

## 2021-12-01 DIAGNOSIS — G35 Multiple sclerosis: Secondary | ICD-10-CM

## 2021-12-01 DIAGNOSIS — G8389 Other specified paralytic syndromes: Secondary | ICD-10-CM

## 2021-12-01 NOTE — Progress Notes (Signed)
Video Visit Note     I performed this clinical encounter by utilizing a real time telehealth video connection between my location and the patient's location. The patient's location was confirmed during this visit. I obtained verbal consent from the patient to perform this clinical encounter utilizing video and prepared the patient by answering any questions they had about the telehealth interaction.    Total time I spent in care of this patient today (excluding time spent on other billable services) was 50 minutes.      [facility]  [address]  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 Blenda Peals at [facility] and [facility] 07[facility]07/2020.  He last received it at [facility] on 01/05/21 and 01/19/21. He tolerates rituximab well with no known side effects.  He is unable to continue Rituximab since moving to a board and care facility in [place].  He was due for rituximab in December 2022 but switched to [place] for ease of administration in the nursing home. He last saw Janelle 2 months ago with a plan to start [place].     Since the last visit:  A few things have happened. About a month ago, he was doing PT and was able to sit up again. However, the first time he was up in a wheelchair and they broke his leg at the hip a second time. He was taken to [facility] (limited records are found in "[facility]" in [place]). He had surgery on the left leg one month ago with rod placed. Per chart: Reposition Left Upper Femur with Intramedullary internal fixation on 11/01/21. Since surgery, the  pain level is 6 because his pain medication is starting to wear off. It is up to a 9/10 at times and he is on a pain management protocol.     During that admission, they also found out that his hip was broken previously and had healed improperly. He was told that the ball and cup in his hip was worn down. The Xrays from two years ago showed a broken hip and they chose to allow that to heal without surgery and address hernias over the hip. He had the Xrays done at [facility] during the latest admission.     CT Pelvis at [facility] 10/30/21:  IMPRESSION:  1. 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 Interval minimally diastatic intertrochanteric fracture left femur new since previous study dated 07/16/2020. Moderate left hip joint hemarthrosis.  2. Evaluation of the right hip joint is limited due to motion artifacts.  3. Fecal impaction in the rectosigmoid. No free fluid in pelvis    He is now back in the skilled nursing home in [place]: [facility]. He is very happy with the care there,    They are continuing the diazepam and baclofen for the spasticity.     He has been on Kesimpta now for about  about 4 months or so. He doesn't notice any improvement. He has OT helping his arms and they can see slight improvements. They are being very cautious because they made a mistake and didn't unstrap his leg that was strapped to the other leg, causing his leg to break when they pushed down on his knee. Although he says that the PT provider caused the recent leg fracture - he understands that it was a mistake and is not pursuing litigation.     MS History:  The onset of symptoms was when he was 35 years ago in 2007 with symptoms of lower extremity weakness, progressing to inability to bear weight. Mr. Paxton was previously on copaxone, tysabri and rituximab treatments in the past, all of which have worked well in the past, according to him. He also  received multiple botox injections for his contractures before COVID, and that, according to patient, has helped with his symptoms as well. Ampyra was helpful in the past; however, was accidentaly stopped during his transfer from one acute rehab to another as pt believes they lost some records during the transfer. He restarted rituximab by Dr. Blenda Peals in 06/2020, 1000 mg every 6 months. Prior to that infusion, the other infusion was over two years prior. Mr. Mcgaugh said that because of COVID-19 he had difficulty organizing transportation to get infusion at the center. When Mr. Nunley was on regular infusions, he reported to not have any relapses at that time.  He states that he tried Tecfidera about 5-6 years ago and stopped it due to rash and swelling all over.    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    Social history:  He is now in a SNF. He reports smoking in the past, but stopped at the age of 59. No current alcohol or other recreational drug use. 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    Family history:   No family history of multiple sclerosis in family members. Young sister diagnosed with lupus at the age of 62.     VIDEO EXAMINATION:  Mental Status: Alert and oriented  State: Calm, Appropriate  Use of language:  Fluent, normal    He is laying down in a hospital bed with  head up. I am only able to see his face.      Labs/Imaging:   Most recent 11/02/21:  CMP, CBC were normal (in care everywhere)    Labs done at [facility] on 01/19/21:  Hepatitis B and C serologies normal  Quantiferon gold TB neg  IgG and IgM normal  CMP normal  CD19 <1  CBC  unremarkable    Labs done 09/01/2021:  CBC, CMP both unremarkable.  IgG, IgA, IgM all normal.  Hepatitis C and B serologies negative.    Assessment / Plan:  In summary, SEAVER MACHIA III is a 51yrold male with a previous diagnosis of multiple sclerosis, advanced, now on Kesimpta after unable to get rituximab coordinated through his SNF. He has advanced disability with severe LE contractures, spasticity, pain. His contractures are severe, painful and prevent mobility because he is limited to laying down on a gurney.  He requires the level of care provided at a SNF. [place] health has been administering [place] thinks for 4 months, but last note two months ago was suggesting it hadn't been started yet.     #MS   - continue Kesimpta for DMT  - Continue Ampyra 10 mg bid for weakness in the arms and legs  - COVID boosters if needed since he is immunosuppressed    #Spasticity / contractures.   - continue diazepam 10 mg four times per day (every 6 hours) for spasticity  - continue 5 mg tab, 1-2 diazepam up to 4 times per day as needed for spasticity, to relax muscles for a shower / bath, transfers or during therapy.  - continue baclofen 20 mg four times per day   - [facility] due to severe spasticity of LEs.  - continue passive ROM with PT and the staff at the board and care.   - continue gabapentin to 300 mg TID for pain    #Home Health:  - wound care-monitor stage 1 red area on buttocks  - home health stretching, strengthening, contracture prevention  - PT and OT to also train board and care staff  - Nursing assessment, medication administration of Kesimpta    Follow up in 6 months with JBurnadette Peter NP.    Total time I spent in care of this patient today (excluding time spent on other billable services) was 50 minutes.    Sincerely,    MReymundo Poll MD, PhD  MS Clinic Director  Health Sciences Clinical Professor of Neurology  [facility]

## 2021-12-21 ENCOUNTER — Other Ambulatory Visit: Payer: Self-pay

## 2021-12-27 ENCOUNTER — Other Ambulatory Visit: Payer: Self-pay

## 2021-12-27 NOTE — Progress Notes (Addendum)
Specialty Pharmacy Telephone Encounter: Medication Fill [place] III  [id number]    Called patient to coordinate medication(s) to be filled at [place]:    Kesimpta Pen 20 mg/0.4 ml #1.2  ml    Telephone follow-up will be scheduled prior to the next refill due date, but patient is encouraged to contact pharmacy sooner if any issues arise.     Ship Date   (MM/DD) Initials &  Program Carrier Method Signature Option Tracking Number   06/13 MV/Neuro FedEx Overnight No Signature Required  [id number]      Contact Person:   Patient   Number of Supplies Left in Days: 0    Credit Card on File:   N/A- $0 Copay    Twana First  Specialty Technician  Specialty Pharmacy   P: [phone] Option 9 then  #4  12/27/21  10:50 AM

## 2021-12-27 NOTE — Progress Notes (Signed)
Reason for Telephone: Medication Adherence Follow-up  Kesimpta Pen '20mg'$ / 0.4 ml     Reviewed Home Medication List: , no new changes in medications were noted.     Med Adherence Screen (Number doses missed/Number tablets remaining): Patient notes that they have not missed any doses.     Adverse Drug Events/Toxicities: None       Telephone follow-up in 28 days, but patient is encouraged to contact pharmacist sooner if any issue arises    Additional Notes: Medication to be delivered on 12/29/21      Pt  has believes care home lost  1 pen.  Pt is due now for med.     Anthony   P: [phone] Option 9 then  #4  12/27/21  10:49 AM

## 2021-12-28 ENCOUNTER — Other Ambulatory Visit: Payer: Self-pay

## 2021-12-29 ENCOUNTER — Other Ambulatory Visit: Payer: Self-pay

## 2021-12-29 NOTE — Progress Notes (Signed)
Pt called noted that it turns out it had been closer to 1.24month since he toke his last Kesimpta injection due to having his leg broken during physical therapy and then him subsequently getting covid. Advised that pt was fine to resume normal monthly dosing schedule since half life of Kesimpta is ~16days. Pt noted that he will request nurse to administer dose today and will call back if they give him any issues over giving him the medication. Pt verbalized understanding of information. Instructed pt to call back with any non-urgent questions or concerns.     JWilfred Lacy PharmD, CSP, MPH  Clinical Specialty Pharmacist   PHN [phone] [phone]

## 2022-02-07 ENCOUNTER — Encounter: Payer: Self-pay | Admitting: Family

## 2022-02-07 NOTE — Telephone Encounter (Signed)
Received call on MS nursing line from:    [facility]  [facility]    RE: Pt needs PA for his Ampyra    Noted pt was last seen on 07/08/19.    Routed to Dr. Joeseph Amor and pcc team for assistance.

## 2022-02-07 NOTE — Telephone Encounter (Signed)
From: [name]  To: Lorne Skeens, NP  Sent: 02/06/2022 9:25 PM PDT  Subject: Transportation to appointments    Hi [name], I wasn't sure how to operate this notice but I talk to you the most this care home that I'm living in now informed me they need a at least six day notice business day notice for appointments lady from the [employer] was informing me that I might have a appointment an early August or in November I just wanted to let anyone know that if they have an appointment for me that I need six day notice business day I don't know how to tell everyone but I was hoping you could help me to know [name] thank you

## 2022-02-08 ENCOUNTER — Other Ambulatory Visit: Payer: Self-pay

## 2022-02-17 ENCOUNTER — Other Ambulatory Visit: Payer: Self-pay

## 2022-03-01 ENCOUNTER — Other Ambulatory Visit: Payer: Self-pay

## 2022-03-04 ENCOUNTER — Ambulatory Visit: Payer: Medicare Other | Admitting: Neurology

## 2022-03-04 DIAGNOSIS — M2450 Contracture, unspecified joint: Secondary | ICD-10-CM

## 2022-03-04 DIAGNOSIS — G8389 Other specified paralytic syndromes: Secondary | ICD-10-CM

## 2022-03-04 DIAGNOSIS — G35 Multiple sclerosis: Secondary | ICD-10-CM

## 2022-03-04 NOTE — Progress Notes (Addendum)
Video Visit Note     I performed this clinical encounter by utilizing a real time telehealth video connection between my location and the patient's location. The patient's location was confirmed during this visit. I obtained verbal consent from the patient to perform this clinical encounter utilizing video and prepared the patient by answering any questions they had about the telehealth interaction.    Total time I spent in care of this patient today (excluding time spent on other billable services) was 30 minutes.      Roy Henry  [facility]  [address]  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 Blenda Peals at [facility] and [facility] 07[facility]07/[facility].  He last received it at [facility] on 01/05/21 and 01/19/21. He tolerated rituximab well with no side effects. Then he was switched to Evaro in around 09/2021 when he entered the SNF (for ease of administration in the nursing home). He is currently at [facility] facility for the last 7 months or so.     Since the last visit, he has been doing a lot better. He broke his leg several months ago. It is now healed. And since his knee braces have helped the knees stretch out more so that he can sit in a wheel chair now. His contratures are 70% on right and left is 75% from working with the RNs and the CNAs. He would let them put the braces on for 6 hours at a time stretching the legs. He is hoping that this will continue to improve. His legs are not longer in a 'pretzel'. They worked with him slowly, used pillows, and added more pillows every other week. He can move  his right arm off the bed 4-5 inches now as well. Insurance denied his trapeze and he might just buy one for $18.    They are continuing the diazepam and baclofen for the spasticity. He is also on dalfampridine 10 mg q12h.    He had COVID 2 months ago in late may / Early june, after the last telemed appointment. 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 He was treated at [facility] and had the COVID medication and steroids and he woke up and was better.     He is worrying about a new Medi-cal plan for him so that he can continue to receive the care that he needs (primary care and a dentist).     S/p surgery in 10/2021 Reposition Left Upper Femur with Intramedullary internal fixation on 11/01/21. During that admission, they also found out that his hip was broken previously and had healed improperly.     CT Pelvis at [facility] 10/30/21:  IMPRESSION:  1. 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 Interval minimally diastatic intertrochanteric fracture left femur new since previous study dated 07/16/2020. Moderate left hip joint hemarthrosis.  2. Evaluation of  the right hip joint is limited due to motion artifacts.  3. Fecal impaction in the rectosigmoid. No free fluid in pelvis      MS History:  The onset of symptoms was when he was 35 years ago in 2007 with symptoms of lower extremity weakness, progressing to inability to bear weight. Roy Henry was previously on copaxone, tysabri and rituximab treatments in the past, all of which have worked well in the past, according to him. He also received multiple botox injections for his contractures before COVID, and that, according to patient, has helped with his symptoms as well. Ampyra was helpful in the past; however, was accidentaly stopped during his transfer from one acute rehab to another as pt believes they lost some records during the transfer. He restarted rituximab by Dr. Blenda Peals in  06/2020, 1000 mg every 6 months. Prior to that infusion, the other infusion was over two years prior. Roy Henry said that because of COVID-19 he had difficulty organizing transportation to get infusion at the center. When Roy Henry was on regular infusions, he reported to not have any relapses at that time.  He states that he tried Tecfidera about 5-6 years ago and stopped it due to rash and swelling all over. He was on rituximab 06/2020, last infused 01/19/21. Stopped after discharge from prolonged hospital stay and unable to get rituximab in the assisted living facility. Now finally in a SNF and able to started on Kesimpta in 09/2021.    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    Social history:  He is now in a SNF. He reports smoking in the past, but stopped at the age of 38. No current alcohol or other recreational drug use. 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    Family history:   No family history of multiple sclerosis in family members. Young sister diagnosed with lupus at the age of 29.     VIDEO EXAMINATION:  Mental Status: Alert and oriented  State: Calm, Appropriate  Use of language:  Fluent, normal    He is laying down in a hospital bed with head up. I am only able to see his face.      Labs/Imaging:   Most recent 11/02/21:  CMP, CBC were normal (in care  everywhere)    Labs done at [facility] on 01/19/21:  Hepatitis B and C serologies normal  Quantiferon gold TB neg  IgG and IgM normal  CMP normal  CD19 <1  CBC  unremarkable    Labs done 09/01/2021:  CBC, CMP both unremarkable.  IgG, IgA, IgM all normal.  Hepatitis C and B serologies negative.    Assessment / Plan:  In summary, [name]  is a 51yrold male with a previous diagnosis of multiple sclerosis, advanced, now on Kesimpta after unable to get rituximab coordinated through his SNF. He has advanced disability with severe LE contractures, spasticity, pain. SNF has done an excellent job with helping the contractures per patient.  It will be good to examine him in person next time.    #MS   - continue Kesimpta for DMT  - Continue Ampyra 10 mg bid for weakness in the arms and legs  - COVID boosters if needed since he is immunosuppressed    #Spasticity / contractures.   - continue diazepam 10 mg four times per day (every 6 hours) for spasticity  - continue 5 mg tab, 1-2 diazepam up to 4 times per day as needed for spasticity, to relax muscles for a shower / bath, transfers or during therapy.  - continue baclofen 20 mg four times per day   - has [facility] due to severe spasticity of LEs.  - continue passive ROM with PT and the staff at the board and care.   - continue gabapentin to 300 mg TID for pain  - wound care-monitor stage 1 red area on buttocks    Follow up in 6 months.    Sincerely,    MReymundo Poll MD, PhD  MS Clinic Director  Health Sciences Clinical Professor of Neurology  [facility]

## 2022-03-15 ENCOUNTER — Other Ambulatory Visit: Payer: Self-pay

## 2022-03-15 NOTE — Progress Notes (Signed)
Specialty Pharmacy Telephone Encounter: Medication Fill [place]  [id number]    Called patient to coordinate medication(s) to be filled at [place]:    [facility] PEN 20 mg/0.4 mL Pen Injector #1.2 ml     Telephone follow-up will be scheduled prior to the next refill due date, but patient is encouraged to contact pharmacy sooner if any issues arise.     Ship Date   (MM/DD) Initials &  Program Carrier Method Signature Option Tracking Number   08/31/ 2023 [facility] NEURO FedEx Overnight No Signature Required  [id number]      Contact Person:   Patient   Number of Supplies Left in Days: [place] on File:   N/A- $0 Copay    Twana First  Specialty Technician  Specialty Pharmacy   P: [phone] Option 9 then  #4  03/15/22  2:50 PM

## 2022-03-15 NOTE — Progress Notes (Signed)
Reason for Telephone: Medication Adherence Follow-up  Kesimpta    Reviewed Home Medication List: , new changes in medications were noted. Pt no longer takes Biotin, never used Narcan  or Botox.   Pt is taking Dalfampridine 10 mg bid    Med Adherence Screen (Number doses missed/Number tablets remaining): Patient notes that they have not missed any doses. Pt next dose due  03/28/22    Adverse Drug Events/Toxicities: None       Telephone follow-up in 28 days, but patient is encouraged to contact pharmacist sooner if any issue arises    Additional Notes: Medication to be delivered on 03/18/22        [place]: [phone] Option 9 then  #4  03/15/22  2:49 PM

## 2022-03-15 NOTE — Progress Notes (Signed)
First  attempt to contact the patient regarding refill coordination for Kesimpta .  Left voicemail and will contact the patient at a later date if no reply is received.    [place] Technician  Specialty Pharmacy   P: [phone] Option 9 then  #4  03/15/22  11:02 AM

## 2022-03-15 NOTE — Progress Notes (Signed)
Discussed the following with the patient     1.        Performed medication history review and med  ication list updated   2.        Welcome Packet was provided with first order, and includes Patient [place] of Rights and Responsibilities, Privacy Notice, proper disposal of unused/expired medication and other information.  3.        Specialty Pharmacy's hours of operation (Monday-Friday, excluding University holidays, 09:00-17:00)  4.        For any complaints the patient may have about his/her experiences with Specialty Pharmacy, patient was advised to call [phone] or toll-free at [phone], or email   [email or web address].  Advised patient that more information about the complaint process can be found in the [employer].  5.        Provided information about copay assistance and/or patient assistance programs which may be available to help lower out-of-pocket costs.    6.        A clinical pharmacist is available to discuss medication concerns or questions.  Patient declined offer to speak with pharmacist at this time, and was advised that during pharmacy's "closed" hours, a pharmacist is available to discuss urgent medication-related concerns or questions.    [place] Technician  Specialty Pharmacy   P: [phone] Option 9 then  #4  03/15/22  2:44 PM

## 2022-03-15 NOTE — Progress Notes (Signed)
Discussed the following with the patient:        Issues (if any) patient has had with taking medication as prescribed:   No  Number of missed doses patient reports over past month:  None  Side effects (if any) experienced while on treatment:  None  Changes to medical history, recent hospitalizations or any new prescription or non-prescription medications or herbal supplements:  Pt no longer takes Biotin, never used Narcan  or Botox.   Pt is taking Dalfampridine 10 mg bid  How patient is currently taking specialty medication (name/dose/frequency):  Injects 1 pen  monthly  Patient's perception of effectiveness of medication therapy:  Doesn't know if medication is or is not helping   Offer was made to patient to speak with a clinical pharmacist.  [place]: [phone] Option 9 then  #4  03/15/22  2:44 PM

## 2022-03-16 ENCOUNTER — Other Ambulatory Visit: Payer: Self-pay

## 2022-03-16 NOTE — Progress Notes (Addendum)
[place]: Pharmacist Reassessment    Roy Henry is a 51yrold patient with a diagnosis of MS for which patient has been prescribed Kesimpta.     The patient's demographics, comorbidities, allergies, relevant vaccinations, and past medical history were reviewed by the pharmacist on 03/16/2022.  In addition, patient's chart was reviewed for changes in patient's health status.    Medication Reconciliation:   Patient's prescription and non-prescription medications and natural supplements were reviewed with the patient by pharmacy technician on 03/15/22.     Patient's refill history was reviewed.  Number of reported missed doses in the past month: none.  Dispense/refill history was reviewed and is congruent .      Reported adverse effects: none    Therapeutic goal: Relapse reduction, symptom control, and avoidance of disease progression        Assessment:  Assessment of adherence: Patient is adherent to medication.      Progress toward achieving patient's therapeutic goal: unknown: pt unsure if med working and has not been seen in clinic for >128yrue to being in a SNF .    Therapy is appropriate given patient's current medication list, experience with adverse events, adherence to therapy, medication efficacy, and progress towards achieving therapeutic goals based on outcomes of therapy.    EDSS 9.0    Reassessment due in approximately 12 months.     JeWilfred LacyPharmD, CSCordovaMPH  Clinical Specialty Pharmacist   [phone] 4        JeKinnie Feil/30/2023 9:01 AM

## 2022-03-17 ENCOUNTER — Other Ambulatory Visit: Payer: Self-pay

## 2022-03-23 ENCOUNTER — Other Ambulatory Visit: Payer: Self-pay

## 2022-03-28 ENCOUNTER — Other Ambulatory Visit: Payer: Self-pay

## 2022-04-06 ENCOUNTER — Other Ambulatory Visit: Payer: Self-pay
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
## 2022-04-11 ENCOUNTER — Other Ambulatory Visit: Payer: Self-pay

## 2022-04-11 NOTE — Progress Notes (Addendum)
Per Dr. Lorenz Coaster, okay to call in [place] spray. [place] (ph: [phone]). Spoke with Ang[facility], who said to fax in medication requested (fax: [phone]) with attention to [facility]. Faxed.    Verna Czech, PharmD  Clinical Pharmacist, Neurology Specialty Pharmacy  [phone] option 9 then 4

## 2022-06-06 ENCOUNTER — Other Ambulatory Visit: Payer: Self-pay

## 2022-06-06 NOTE — Progress Notes (Signed)
Specialty Pharmacy Telephone Encounter: Medication Fill [place]  [id number]    Called patient to coordinate medication(s) to be filled at [place]:    [facility] PEN 20 mg/0.4 mL Pen Injector #1.2  ml      Telephone follow-up will be scheduled prior to the next refill due date, but patient is encouraged to contact pharmacy sooner if any issues arise.     Ship Date   (MM/DD) Initials &  Program Carrier Method Signature Option Tracking Number   11/21/ 23 [facility] NEURO FedEx Overnight No Signature Required  [id number]     Contact Person:   Patient   Number of Supplies Left in Days: [place] on File:   N/A- $0 Copay      Twana First  Specialty Technician  Specialty Pharmacy   P: [phone] Option 9 then  #4  06/06/22  11:21 AM

## 2022-06-06 NOTE — Progress Notes (Signed)
Reason for Telephone: Medication Adherence Follow-up Kesimpta     Reviewed Home Medication List: , no new changes in medications were noted.     Med Adherence Screen (Number doses missed/Number tablets remaining): Patient notes that they have not missed any doses.     Adverse Drug Events/Toxicities: None       Telephone follow-up in 28 days, but patient is encouraged to contact pharmacist sooner if any issue arises    Additional Notes: Medication to be delivered on 06/08/22        [place]   P: [phone] Option 9 then  #4  06/06/22  11:20 AM

## 2022-06-07 ENCOUNTER — Other Ambulatory Visit: Payer: Self-pay

## 2022-06-08 ENCOUNTER — Ambulatory Visit: Payer: Medicare Other | Admitting: Neurology

## 2022-07-06 ENCOUNTER — Ambulatory Visit
Payer: Medicare Other | Attending: Student in an Organized Health Care Education/Training Program | Admitting: Student in an Organized Health Care Education/Training Program

## 2022-07-06 VITALS — BP 115/77 | HR 62 | Temp 98.0°F | Ht 73.0 in

## 2022-07-06 DIAGNOSIS — G35 Multiple sclerosis: Secondary | ICD-10-CM

## 2022-07-06 DIAGNOSIS — G8389 Other specified paralytic syndromes: Secondary | ICD-10-CM

## 2022-07-06 DIAGNOSIS — R252 Cramp and spasm: Secondary | ICD-10-CM | POA: Insufficient documentation

## 2022-07-06 NOTE — Patient Instructions (Signed)
Dear Roy Henry,     Thank you for visiting [place] in the [facility] today. You were seen by me, Dr. Venora Maples in treatment for lower extremity spasticity. Thank you for your patience today, sometimes our visits in the neurology clinic may take longer than expected.       After your botox:  - Please do not wash the area, massage or put any lotion on the area for 24 hours, then go back to your regular activities   - Things to watch for: drooping in eyebrow or eyelid, or weakness in neck/shoudlers.  - Other possible reactions: flu-like symptoms for a couple days (use Tylenol, or Ibuprofen, or Naproxen as needed to help), worsened headache, (very rare an allergic reaction)      ---------------------------------------------------------    Imaging: If I have ordered imaging for you such as a CT or MRI you will need to call the radiology department to schedule it at [phone]; it may take 1-2 weeks for the test to be approved by your insurance.     Labs: you may have your labs done at our lab located on the main level of our clinic building, [address]; the lab hours are 7:30AM - 5:30PM. You may have your labs drawn at any other [facility] - the hours and location of which can be found online - [email or web address].     Referrals and other tests: You will be called to schedule new referrals or other tests once authorized by your insurance. If you do not hear from the department that you have been referred to, you can call our clinic for assistance.     ---------------------------------------------------------    Medications:     Medications ordered or refilled by me are sent electronically to your pharmacy.     Please always check the number of refills you have prior to our visits in the future so that you may request refills in person if necessary. Additionally, refills may be requested through your pharmacy or by calling my office. It is  important that you stay on top of your need for refills.     Always notify me if you are having side effects to your medications or if you would like to stop or decrease your medication - you should not do this on your own.     Missing doses of medications can cause your treatment to be ineffective; let me know if you are having trouble remembering to take your medication.     You should always carry a complete medication list with you at all times - such as in your wallet or smart phone. Include the name, dose, and schedule. Keep this list up to date.     ---------------------------------------------------------    Important note regarding messaging (MyChart):     I am happy to communicate with you in between visits for questions or concerns related to your neurologic condition, medication side effects or other concerns that can not wait until your follow up visit. I will do my best to address your concerns within 3-5 business days but there are many factors that may affect this timeline. Messages do not come directly to me but are first reviewed by our clinic staff who may be able to help with your issue. Do not use MyChart for urgent matters or emergencies. If there is an urgent matter you may call the clinic for assistance or contact your primary care provider. For emergencies go to your nearest emergency room  or call 911.          Regards,     Dr. Venora Maples

## 2022-07-06 NOTE — Progress Notes (Signed)
PATIENT:   Roy Henry  MRN:         [id number]  DATE:        07/06/2022    HISTORY of PRESENT ILLNESS     Roy Henry is a 51yrold previously right-handed (now with R arm weakness)man who presents for initial visit with spasticity due to multiple sclerosis. He follows with Dr. MBriscoe Deutscher      [facility].     In the interim period he has had a fall and left hip fracture which has worsened his mobility to the point of being unable to sit up due to spasticity and contractures.     He is receiving physical therapy which has helped greatly with his contractures.     Medication list, past medical, surgical, family and social history were reviewed and/or documented in the electronic medical record.       EXAMINATION:  Temp: 36.7 C (98 F) (12/20 1220)  Temp src: Skin (12/20 1220)  Pulse: 62 (12/20 1220)  BP: 115/77 (12/20 1220)  Resp: --  SpO2: --  Height: 185.4 cm ('6\' 1"'$ ) (12/20 1220)  Weight: --    General: well appearing, breathing comfortably on room air, no visible rashes; he is laying on a transport gurney  The patient has intact skin, appears clean and well cared for without skin breakdown     Neuro Exam:    Mental State: awake, linear, lucid   Use of language:  fluent  Eye movements: full range of motion, smooth  Speech: clear, normal rate pitch and volume without dysarthria or hypophonia  Motor: full strength of left deltoid, bicep (5/5) grip is 4/5 and extension of LUE 4/5  RUE 2-3/5 throughout  Lower extremities with trace movement and marked spastic contractures with left LE with significant adduction   Sensory: grossly intact to light touch in forearms and shins    Available relevant imaging and labs were reviewed and documented below if applicable to this visit.       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 His spasticity is markedly impacting his quality of life because he is unable to sit up in a wheelchair.   I restarted the injections today (it had been >3 years since last injections) with a slightly reduced dose from prior (see separate note)       Return to clinic in 3 month(s) for repeat injections.     Total time I spent in care of this patient today (excluding time spent on other billable services) was 75 minutes.    Sincerely,  CLuther Parody DO  Assistant Professor   Department of Neurology  [facility]

## 2022-07-06 NOTE — Nursing Note (Signed)
Identify by two identifiers vitals taken, allergies and pain level verified,tobacco usage, pharmacy updated.   Patient WAS not wearing a surgical mask      Tagan Bartram MA

## 2022-07-06 NOTE — Procedures (Signed)
BOTULINUM TOXIN INJECTION    The benefits and risks of botulinum toxin injection were explained in detail to [place], who expressed understanding and signed an electronic consent form which was uploaded to the chart.    The area was cleaned with alcohol and EMG ground leads were placed on antagonist muscle groups.     Botulinum toxin type A - Xeomin (100 units in 2cc normal saline dilution x6) was injected under EMG guidance as follows:    MUSCLE                                        UNITS                EMG ACTIVITY  R gastrocnemius:    25 units  +  R anterior tibialis    25 units  +  R posterior tibialis   25 units  +  R bicep femorus   50 units  +  R semitendinosis    50 units   +  R adductor magnus   50 units  +    L gastrocnemius:    25 units   +  L anterior tibialis    25 units  +  L posterior tibialis   25 units  +  L bicep femorus   50 units  +  L semitendinosis    50 units   +  L adductor magnus    50 units   +    Total dose injected (in units)  450  Wastage (in units)    50    The patient tolerated the procedure well with no complications.    Expectations from botulinum toxin injections were explained to [name] and was told that he may experience immediate benefit as a result of local injection site response, however true effect of the toxin is not expected for 2-4 weeks.  The patient was instructed to monitor response carefully over the next few months.    I spent 20 on this procedure.     Electronically signed by:   Luther Parody, DO  Assistant Clinical Professor  Department of Neurology  [facility]

## 2022-07-07 ENCOUNTER — Other Ambulatory Visit: Payer: Self-pay

## 2022-07-13 ENCOUNTER — Other Ambulatory Visit: Payer: Self-pay

## 2022-07-19 ENCOUNTER — Other Ambulatory Visit: Payer: Self-pay

## 2022-07-25 ENCOUNTER — Other Ambulatory Visit: Payer: Self-pay

## 2022-07-25 ENCOUNTER — Other Ambulatory Visit: Payer: Self-pay | Admitting: Family

## 2022-07-25 DIAGNOSIS — G35 Multiple sclerosis: Secondary | ICD-10-CM

## 2022-07-25 DIAGNOSIS — Z5181 Encounter for therapeutic drug level monitoring: Secondary | ICD-10-CM

## 2022-07-25 MED ORDER — KESIMPTA PEN 20 MG/0.4 ML SUBCUTANEOUS PEN INJECTOR
20.0000 mg | PEN_INJECTOR | SUBCUTANEOUS | 3 refills | Status: DC
  Filled 2022-07-25: qty 1.2, 84d supply, fill #0
  Filled 2022-10-19 – 2022-11-15 (×2): qty 1.2, 84d supply, fill #1
  Filled 2023-02-14: qty 1.2, 84d supply, fill #2
  Filled 2023-05-30: qty 1.2, 84d supply, fill #3

## 2022-07-25 NOTE — Telephone Encounter (Signed)
Kesimpta Refill Request: approved x62yr   Last Apt: 07/06/22: #MS   - continue Kesimpta for DMT  - Continue Ampyra 10 mg bid for weakness in the arms and legs  - COVID boosters if needed since he is immunosuppressed    Next Apt: none scheduled with MS provider- pt instructed to fup in 649moWill forward to clinic to call to schedule.     Kesimpta monitoring lab orders placed- will notify pt to complete    JeWilfred LacyPharmD, CSP, MPH  Clinical Specialty Pharmacist   PHN [phone] 4

## 2022-07-26 ENCOUNTER — Other Ambulatory Visit: Payer: Self-pay

## 2022-07-26 NOTE — Progress Notes (Signed)
Specialty Pharmacy Telephone Encounter: Medication Fill [place]  [id number]    Called patient to coordinate medication(s) to be filled at [facility]:    [facility] PEN 20 mg/0.4 mL Pen Injector # 1.2 ml    Telephone follow-up will be scheduled prior to the next refill due date, but patient is encouraged to contact pharmacy sooner if any issues arise.     Ship Date   (MM/DD) Initials &  Program Carrier Method Signature Option Tracking Number   01/10/ 24 MV NEURO FedEx Overnight No Signature Required   [id number]     Contact Person:   Patient   Number of Supplies Left in Days: 30    Credit Card on File:   N/A- $0 Copay      Twana First  Specialty Technician  Specialty Pharmacy   P: [phone] Option 9 then  #4  07/26/22  12:14 PM

## 2022-07-26 NOTE — Progress Notes (Signed)
Reason for Telephone: Medication Adherence Follow-up Kesimpta     Reviewed Home Medication List: Yes, no new changes in medications were noted.    Med Adherence Screen (Number doses missed/Number tablets remaining): Patient notes that they have not missed any doses. Has over 30 day on hand     Adverse Drug Events/Toxicities: None    Telephone follow-up in 25 days, but patient is encouraged to contact pharmacist sooner if any issue arises    Additional Notes: Medication to be delivered on 07/28/22      Labs faxed to  care home via right fax.  Return  fax # included .  3 months  filled on  transition.  Will send PA       [place]   P: [phone] Option 9 then  #4  07/26/22  12:12 PM

## 2022-07-27 ENCOUNTER — Other Ambulatory Visit: Payer: Self-pay

## 2022-07-27 NOTE — Progress Notes (Signed)
Received fax from [facility].     Patient received transition fill with 3 month supply of Kesimpta on 07/25/22. Pharmacy technicians to follow up to ensure PA reauthorization and continuation of Kesimpta coverage and will notify [facility] if any assistance needed.    Roy Henry, PharmD  Clinical Pharmacist, Neurology Specialty Pharmacy  [phone] option 9 then 4

## 2022-07-29 NOTE — Progress Notes (Signed)
Patient has been scheduled since this message.

## 2022-08-11 ENCOUNTER — Other Ambulatory Visit: Payer: Self-pay

## 2022-08-12 ENCOUNTER — Other Ambulatory Visit: Payer: Self-pay

## 2022-08-12 NOTE — Progress Notes (Signed)
Called carehome and spoke with nurse Faith- she confirmed labs were not done but will get those done soon and have the results faxed back to [place] [phone].    [place] Technician III  Neurology Specialty Pharmacy   [facility]: [phone] #9 then #4  08/12/22 3:00 PM

## 2022-08-17 MED FILL — incobotulinumtoxinA 100 unit intramuscular solution: INTRAMUSCULAR | Qty: 50 | Status: AC

## 2022-08-17 MED FILL — incobotulinumtoxinA 100 unit intramuscular solution: INTRAMUSCULAR | Qty: 400 | Status: AC

## 2022-08-19 ENCOUNTER — Other Ambulatory Visit: Payer: Self-pay

## 2022-08-19 NOTE — Progress Notes (Signed)
Specialty Pharmacy Prior Authorization Status    Prior authorization for Kesimpta '20MG'$ /0.4ML auto-injectors  was submitted through [facility] to     [place]                     .     CMM Key:  [id number]      Will provide status update once a final decision has been determined.    [place] Technician  Specialty Pharmacy   P: [phone] Option 9 then  #4  08/19/22  3:50 PM

## 2022-08-22 ENCOUNTER — Other Ambulatory Visit: Payer: Self-pay

## 2022-08-22 NOTE — Progress Notes (Signed)
Specialty Pharmacy Prior Authorization Status Update    Kesimpta  Prior authorization has been APPROVED     Authorization Date:  08/19/22 until Further notice     Pharmacy: [facility]     Will notify Clinical Pharmacist of patient's prior authorization status to determine next appropriate steps.      [place] Technician  Specialty Pharmacy   P: [phone] Option 9 then  #4  08/22/22  8:50 AM

## 2022-09-07 ENCOUNTER — Other Ambulatory Visit: Payer: Self-pay

## 2022-09-07 NOTE — Progress Notes (Signed)
Called carehome and spoke with nurse Faith,   lab only  completed CBC and CMP.   Faxed  copies forward to [facility] for  review.      Per Kyra Searles, they will notify lab  again to  draw and  complete missed Immuno's and  fax back  once complete      [place]: [phone] Option 9 then  #4  09/07/22  3:43 PM

## 2022-09-14 NOTE — Progress Notes (Signed)
Hello     Appointment scheduled on 09/22/22 with Burnadette Peter NP

## 2022-09-22 ENCOUNTER — Telehealth: Payer: Self-pay | Admitting: Family

## 2022-09-22 ENCOUNTER — Encounter: Payer: Self-pay | Admitting: Family

## 2022-09-22 ENCOUNTER — Ambulatory Visit: Payer: Medicare Other

## 2022-09-22 ENCOUNTER — Ambulatory Visit: Payer: Medicare Other | Attending: Family | Admitting: Family

## 2022-09-22 VITALS — BP 153/94 | HR 75 | Temp 97.8°F | Resp 18

## 2022-09-22 DIAGNOSIS — E559 Vitamin D deficiency, unspecified: Secondary | ICD-10-CM | POA: Insufficient documentation

## 2022-09-22 DIAGNOSIS — M24561 Contracture, right knee: Secondary | ICD-10-CM | POA: Insufficient documentation

## 2022-09-22 DIAGNOSIS — R252 Cramp and spasm: Secondary | ICD-10-CM | POA: Insufficient documentation

## 2022-09-22 DIAGNOSIS — Z5181 Encounter for therapeutic drug level monitoring: Secondary | ICD-10-CM

## 2022-09-22 DIAGNOSIS — M24562 Contracture, left knee: Secondary | ICD-10-CM | POA: Insufficient documentation

## 2022-09-22 DIAGNOSIS — G8389 Other specified paralytic syndromes: Secondary | ICD-10-CM

## 2022-09-22 DIAGNOSIS — G35 Multiple sclerosis: Secondary | ICD-10-CM | POA: Insufficient documentation

## 2022-09-22 LAB — COMPREHENSIVE METABOLIC PANEL
Alanine Transferase (ALT): 14 U/L (ref <=41)
Albumin: 4.1 g/dL (ref 4.0–4.9)
Alkaline Phosphatase (ALP): 86 U/L (ref 35–129)
Anion Gap: 12 mmol/L (ref 7–15)
Aspartate Transaminase (AST): 14 U/L (ref <=41)
Bilirubin Total: 0.3 mg/dL (ref <=1.2)
Calcium: 9.3 mg/dL (ref 8.6–10.0)
Carbon Dioxide Total: 26 mmol/L (ref 22–29)
Chloride: 103 mmol/L (ref 98–107)
Creatinine Serum: 0.63 mg/dL (ref 0.51–1.17)
E-GFR Creatinine (Male): >100 mL/min/1.73m*2
Glucose: 90 mg/dL (ref 74–109)
Potassium: 4 mmol/L (ref 3.4–5.1)
Protein: 7.7 g/dL (ref 6.6–8.7)
Sodium: 141 mmol/L (ref 136–145)
Urea Nitrogen, Blood (BUN): 8 mg/dL (ref 6–20)

## 2022-09-22 LAB — VITAMIN D, 25 HYDROXY: Vitamin D, 25 Hydroxy: 32.7 ng/mL (ref 10.0–50.0)

## 2022-09-22 LAB — IMMUNOGLOBULIN M: IMMUNOGLOBULIN M: 54 mg/dL (ref 40–230)

## 2022-09-22 LAB — IMMUNOGLOBULIN A: IMMUNOGLOBULIN A: 300 mg/dL (ref 70–400)

## 2022-09-22 LAB — IMMUNOGLOBULIN G: IMMUNOGLOBULIN G: 1268 mg/dL (ref 700–1600)

## 2022-09-22 NOTE — Telephone Encounter (Signed)
Patient going outside of [facility] for OT (Ocupational Therapy) and OT (Ocupational Therapy). Requested information to be sent too [facility]      Patient going outside of [place] for PT (Physical Therapy) and PT (Physical Therapy). Requested information to be sent too [facility].     Address: [address]  [place], [place] [address]  Phone: [phone]     [place]  Neurology Department  Main Line: [phone]  Back Line: [phone]

## 2022-09-22 NOTE — Patient Instructions (Addendum)
Thank you for visiting the Clinical Neurosciences Department at the [facility] today.    The neurologist(s) you saw today was(were):   Nurse Practitioner: Burnadette Peter NP     Recommendations made during today's visit include:   Follow-up in 6 months with Dr. Lorenz Coaster.    The following diagnostic tests have been ordered to help [place] better determine the underlying cause of your condition as well as guide best treatment. :    Please go to the lab today for blood testing.    The following changes to your medication regimen have been made:    No changes.      The following referrals have been generated through the [place] system:   Physical therapy and occupational therapy.      An open line of communication between you, your family, and [place] (your neurology team) is important to [place], both for safety and maintaining the highest quality of patient care.  --------------------------------------------------------------------------------------------------------    If you have any questions, please feel free to ask [place]!    1. By telephone.  You can reach your resident neurologist by calling the [place] neurology clinic at [phone].    2. Online. We also encourage you to register for [facility] [employer]," which allows you online computer access to your medical chart, as well as an opportunity to e-mail your doctor regarding non-urgent issues.  You can sign up by visiting the following link: "[email or web address]".  3. You will need to be seen at least annually for [place] to be able to continue refilling prescriptions to ensure continued safety and efficacy of medications.    Thank you for choosing the Neurology department at the [facility].  It was a pleasure to participate in your care today    Sincerely,     Burnadette Peter, NP-BC

## 2022-09-22 NOTE — Nursing Note (Signed)
.  Vital signs taken, screened for pain, pharmacy verified, allergies reviewed, tobacco questions asked.  Lilana Blasko MA II
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
## 2022-09-23 ENCOUNTER — Encounter: Payer: Self-pay | Admitting: Family

## 2022-10-05 ENCOUNTER — Ambulatory Visit
Payer: Medicare Other | Attending: Student in an Organized Health Care Education/Training Program | Admitting: Student in an Organized Health Care Education/Training Program

## 2022-10-05 ENCOUNTER — Encounter: Payer: Self-pay | Admitting: Student in an Organized Health Care Education/Training Program

## 2022-10-05 VITALS — BP 94/67 | HR 73 | Temp 97.7°F | Ht 73.5 in | Wt 190.0 lb

## 2022-10-05 DIAGNOSIS — G35 Multiple sclerosis: Secondary | ICD-10-CM | POA: Insufficient documentation

## 2022-10-05 DIAGNOSIS — R252 Cramp and spasm: Secondary | ICD-10-CM

## 2022-10-05 NOTE — Patient Instructions (Signed)
Dear Roy Henry,     Thank you for visiting [place] in the [facility] today. You were seen by me, Dr. Venora Maples in treatment for spasticity. Thank you for your patience today, sometimes our visits in the neurology clinic may take longer than expected.     Here is a summary of the plan we discussed today:     After your botox:  - Please do not wash the area, massage or put any lotion on the area for 24 hours, then go back to your regular activities   - Things to watch for: drooping in eyebrow or eyelid, or weakness in neck/shoudlers.  - Other possible reactions: flu-like symptoms for a couple days (use Tylenol, or Ibuprofen, or Naproxen as needed to help), worsened headache, (very rare an allergic reaction)      ---------------------------------------------------------    Imaging: If I have ordered imaging for you such as a CT or MRI you will need to call the radiology department to schedule it at [phone]; it may take 1-2 weeks for the test to be approved by your insurance.     Labs: you may have your labs done at our lab located on the main level of our clinic building, [address]; the lab hours are 7:30AM - 5:30PM. You may have your labs drawn at any other  Lab - the hours and location of which can be found online - [email or web address].     Referrals and other tests: You will be called to schedule new referrals or other tests once authorized by your insurance. If you do not hear from the department that you have been referred to, you can call our clinic for assistance.     ---------------------------------------------------------    Medications:     Medications ordered or refilled by me are sent electronically to your pharmacy.     Please always check the number of refills you have prior to our visits in the future so that you may request refills in person if necessary. Additionally, refills may be requested through your pharmacy  or by calling my office. It is important that you stay on top of your need for refills.     Always notify me if you are having side effects to your medications or if you would like to stop or decrease your medication - you should not do this on your own.     Missing doses of medications can cause your treatment to be ineffective; let me know if you are having trouble remembering to take your medication.     You should always carry a complete medication list with you at all times - such as in your wallet or smart phone. Include the name, dose, and schedule. Keep this list up to date.     ---------------------------------------------------------    Important note regarding messaging (MyChart):     I am happy to communicate with you in between visits for questions or concerns related to your neurologic condition, medication side effects or other concerns that can not wait until your follow up visit. I will do my best to address your concerns within 3-5 business days but there are many factors that may affect this timeline. Messages do not come directly to me but are first reviewed by our clinic staff who may be able to help with your issue. Do not use MyChart for urgent matters or emergencies. If there is an urgent matter you may call the clinic for assistance or contact your primary  care provider. For emergencies go to your nearest emergency room or call 911.          Regards,     Dr. Venora Maples

## 2022-10-05 NOTE — Nursing Note (Signed)
Identify by two identifiers vitals taken, allergies and pain level verified,tobacco usage, pharmacy updated.   Patient WAS not wearing a surgical mask      Erasmus Bistline MA

## 2022-10-05 NOTE — Procedures (Addendum)
BOTULINUM TOXIN INJECTION    Roy Henry is a 52yr-old previously right-handed (now with R arm weakness) man who presents for follow up for spasticity due to multiple sclerosis.     Last procedure had benefit of allowing him to sit up in a wheelchair and bed more and had more flexibility of his lower extremities for care. He does not note any wearing off.     The benefits and risks of botulinum toxin injection were explained in detail to Roy Henry, who expressed understanding and signed an electronic consent form which was uploaded to the chart.    The area was cleaned with alcohol and EMG ground leads were placed on antagonist muscle groups.     Botulinum toxin type A - Xeomin (100 units in 2cc normal saline dilution x6) was injected under EMG guidance as follows:    MUSCLE                                        UNITS                EMG ACTIVITY  R gastrocnemius:    25 units  +  R anterior tibialis    25 units  +  R posterior tibialis   25 units  +  R bicep femorus   62.5 units  +  R semitendinosis    62.5 units   +  R adductor magnus   75 units  +    L gastrocnemius:    25 units   +  L anterior tibialis    25 units  +  L posterior tibialis   25 units  +  L bicep femorus   62.5 units  +  L semitendinosis    62.5 units   +  L adductor magnus    75 units   +      Total dose injected (in units)  550  Wastage (in units)    50    The patient tolerated the procedure well with no complications.    Expectations from botulinum toxin injections were explained to Roy Henry and was told that he may experience immediate benefit as a result of local injection site response, however true effect of the toxin is not expected for 2-4 weeks.  The patient was instructed to monitor response carefully over the next few months.    I spent 20 on this procedure.     Electronically signed by:   Erven Colla, DO  Assistant Clinical Professor  Department of Neurology  [facility]

## 2022-10-07 NOTE — Progress Notes (Signed)
Clinical Pharmacist Note:    Subjective/Objective:  Patient on Kesimpta (ofatumumab) 1 pen SubQ monthly and Ampyra (dalfampridine) 10mg  BID for Multiple Sclerosis treatment and treatment of ambulatory dysfunction, respectively.    Relevant labs:  Last Comprehensive Metabolic Panel  Lab Results   Component Value Date    NA 141 09/22/2022    K 4.0 09/22/2022    CL 103 09/22/2022    CO2 26 09/22/2022    BUN 8 09/22/2022    CR 0.63 09/22/2022    GLU 90 09/22/2022    CA 9.3 09/22/2022    TP 7.7 09/22/2022    ALB 4.1 09/22/2022    ALP 86 09/22/2022    AST 14 09/22/2022    TBIL 0.3 09/22/2022    ALT 14 09/22/2022     Estimated CrCl ~150ml/min     Last Complete Blood Count (with Differential)  Lab Results   Component Value Date    WBC 5.9 01/19/2021    RBC 4.14 (L) 01/19/2021    HGB 12.3 (L) 01/19/2021    HCT 36.1 (L) 01/19/2021    MCV 87.4 01/19/2021    MCH 29.7 01/19/2021    MCHC 34.0 01/19/2021    RDW 13.3 01/19/2021    PLT 283 01/19/2021    MPV 8.2 01/19/2021     Lab Results   Component Value Date    ANCAUTOPCNT 56.5 01/19/2021    ADLYMPCENT 32.2 01/19/2021    MONOPCNTAUT 9.8 01/19/2021    AUTOEOS 1.1 01/19/2021    ADBASOPCENT 0.4 01/19/2021    ANCAUTO 3.3 01/19/2021    ADLYMABS 1.9 01/19/2021    MONOABSAUT 0.6 01/19/2021    AUTOEOSABS 0.1 01/19/2021    ADBASOABS 0.0 01/19/2021     Last Tuberculosis Screening  Lab Results   Component Value Date    QFTQUAL Negative 01/20/2021     Viral Hepatitis Serology  No results found for: "HEPAABIGG", "ANTIHBC"  Lab Results   Component Value Date    HBSABQL Nonreactive 01/19/2021    HBSAG Nonreactive 01/19/2021    HEPBCOREABTO Nonreactive 01/19/2021     Lab Results   Component Value Date    HEPCABSCR Nonreactive 01/19/2021     Immunoglobulins  Lab Results   Component Value Date    IGG 1,268 09/22/2022    IGM 54 09/22/2022    IGA 300 09/22/2022     Next Appointment:  Future Appointments   Date Time Provider [place]   01/02/2023 11:30 AM Alene Mires, DO [facility]  [facility]   03/30/2023  1:00 PM Reymundo Poll, MD [facility]   04/04/2023 11:30 AM Alene Mires, DO [facility]     Assessment/Plan:   Labs reviewed. Stable. Patient to continue taking Kesimpta (ofatumumab) and Ampyra (dalfampridine) as prescribed.  Of note, repeat CBC was not drawn, thus due for annual CBC at earliest convenience. Also due for q2 year hep B serologies ~01/2023, thus can complete at same time for convenience.  Next labs due ~09/2023.    Verna Czech, PharmD  Clinical Pharmacist, [facility]  [phone] option 9 then 4

## 2022-10-13 ENCOUNTER — Other Ambulatory Visit: Payer: Self-pay

## 2022-10-19 ENCOUNTER — Encounter: Payer: Self-pay | Admitting: Family

## 2022-10-19 ENCOUNTER — Encounter: Payer: Self-pay | Admitting: Student in an Organized Health Care Education/Training Program

## 2022-10-19 ENCOUNTER — Other Ambulatory Visit: Payer: Self-pay

## 2022-10-19 NOTE — Telephone Encounter (Signed)
From: [name]  To: Lorne Skeens  Sent: 10/19/2022 8:53 AM PDT  Subject: Medication dosage increase    hi Janelle I recently got several injections of Botox on my and they are showing positive results what is happening as I'm getting more flexible I'm stretching to areas that are cause me a little more uncomfortability that was being treated by my diazepam. I'm at 10 mg now and I was hoping for a mild increase to help relieve the pressure, I'm not sure if I could get that from you and I have chosen not to increase my pain meds but I was thinking this might help thank you for any advice or help you can give me [name]

## 2022-10-19 NOTE — Telephone Encounter (Signed)
From: [name]  To: Darden Palmer  Sent: 10/19/2022 8:45 AM PDT  Subject: Treatment update    Mr. Campos. Wanted to let you know I'm showing positive results from my recent visit. My legs are a little more flexible and I'm even showing positive results in my upper body extremities. I'm able to lift heavier weights. Thank you so much for the help [name]

## 2022-10-19 NOTE — Progress Notes (Signed)
Excess on hand qty noted, med fill history reviewed. No immediate concerns for adherence issues, will continue to monitor.     Kinnie Feil) Doy Mince, PharmD, CSP, MPH  Sr. Clinical Specialty Pharmacist   PHN [phone]

## 2022-10-19 NOTE — Progress Notes (Signed)
Spoke with Nurse Faith pt next dose due 4/14.  Currently pt has 2 pens on hand.   Faith asked for a call back 5/1. Pt has not missed any doses and is doing well      Refill task  updated     Colman   P: [phone] Option 9 then  #4  10/19/22  10:56 AM

## 2022-10-24 ENCOUNTER — Other Ambulatory Visit: Payer: Self-pay

## 2022-11-03 MED FILL — onabotulinumtoxinA 100 unit solution for injection: INTRAMUSCULAR | Qty: 50 | Status: AC

## 2022-11-03 MED FILL — onabotulinumtoxinA 100 unit solution for injection: INTRAMUSCULAR | Qty: 500 | Status: AC

## 2022-11-10 ENCOUNTER — Other Ambulatory Visit: Payer: Self-pay

## 2022-11-15 ENCOUNTER — Other Ambulatory Visit: Payer: Self-pay

## 2022-11-16 ENCOUNTER — Other Ambulatory Visit: Payer: Self-pay

## 2022-11-16 NOTE — Progress Notes (Signed)
Specialty Pharmacy Telephone Encounter: Medication Fill Coordination     JATHAN BALLING  [id number]    Called patient to coordinate medication(s) to be filled at [facility]:    [name] 20mg /0.4 ml Pen  #1.2    Telephone follow-up will be scheduled prior to the next refill due date, but patient is encouraged to contact pharmacy sooner if any issues arise.     Schedule Ship/Pick Up Date:  5/2  Contact Person:   Patient 's nurse Analyn  Number of Supplies Left in Days: 30    Credit Card on File:   N/A- $0 Copay      Initials &  Program Carrier Method Signature Option   [facility]  FedEx Overnight No Signature Required        [name]  Specialty Technician  Specialty Pharmacy   P: [phone] Option 9 then  #4  11/16/22  10:11 AM

## 2022-11-16 NOTE — Progress Notes (Signed)
Reason for Telephone: Medication Adherence Follow-up Kesimpta     Reviewed Home Medication List: , no new changes in medications were noted.     Med Adherence Screen (Number doses missed/Number tablets remaining): Patient notes that they have not missed any doses. Last dose 4/17  and pt  has  1 pen  on  hand     Adverse Drug Events/Toxicities: None       Telephone follow-up in 28 days, but patient is encouraged to contact pharmacist sooner if any issue arises    Additional Notes: Medication to be delivered on 11/18/22        [name]  Specialty Technician  Specialty Pharmacy   P: [phone] Option 9 then  #4  11/16/22  10:08 AM

## 2022-12-09 ENCOUNTER — Emergency Department (EMERGENCY_DEPARTMENT_HOSPITAL): Payer: Medicare Other

## 2022-12-09 ENCOUNTER — Inpatient Hospital Stay
Admission: AD | Admit: 2022-12-09 | Discharge: 2022-12-12 | DRG: 690 | Disposition: A | Discharge status: Skilled Nursing Facility | Payer: Medicare Other | Source: Skilled Nursing Facility | Attending: INTERNAL MEDICINE | Admitting: INTERNAL MEDICINE

## 2022-12-09 DIAGNOSIS — K089 Disorder of teeth and supporting structures, unspecified: Secondary | ICD-10-CM | POA: Diagnosis present

## 2022-12-09 DIAGNOSIS — I745 Embolism and thrombosis of iliac artery: Secondary | ICD-10-CM | POA: Diagnosis present

## 2022-12-09 DIAGNOSIS — A419 Sepsis, unspecified organism: Secondary | ICD-10-CM

## 2022-12-09 DIAGNOSIS — Z7901 Long term (current) use of anticoagulants: Secondary | ICD-10-CM

## 2022-12-09 DIAGNOSIS — G35 Multiple sclerosis: Secondary | ICD-10-CM | POA: Diagnosis present

## 2022-12-09 DIAGNOSIS — R4182 Altered mental status, unspecified: Secondary | ICD-10-CM

## 2022-12-09 DIAGNOSIS — R918 Other nonspecific abnormal finding of lung field: Secondary | ICD-10-CM

## 2022-12-09 DIAGNOSIS — K8689 Other specified diseases of pancreas: Secondary | ICD-10-CM

## 2022-12-09 DIAGNOSIS — I10 Essential (primary) hypertension: Secondary | ICD-10-CM | POA: Diagnosis present

## 2022-12-09 DIAGNOSIS — D638 Anemia in other chronic diseases classified elsewhere: Secondary | ICD-10-CM | POA: Diagnosis present

## 2022-12-09 DIAGNOSIS — N390 Urinary tract infection, site not specified: Principal | ICD-10-CM | POA: Diagnosis present

## 2022-12-09 DIAGNOSIS — I959 Hypotension, unspecified: Secondary | ICD-10-CM | POA: Diagnosis present

## 2022-12-09 DIAGNOSIS — K4090 Unilateral inguinal hernia, without obstruction or gangrene, not specified as recurrent: Secondary | ICD-10-CM

## 2022-12-09 DIAGNOSIS — Z7401 Bed confinement status: Secondary | ICD-10-CM

## 2022-12-09 DIAGNOSIS — J9811 Atelectasis: Secondary | ICD-10-CM

## 2022-12-09 DIAGNOSIS — I2489 Other forms of acute ischemic heart disease: Secondary | ICD-10-CM | POA: Diagnosis present

## 2022-12-09 DIAGNOSIS — H538 Other visual disturbances: Secondary | ICD-10-CM | POA: Diagnosis present

## 2022-12-09 DIAGNOSIS — K5909 Other constipation: Secondary | ICD-10-CM | POA: Diagnosis present

## 2022-12-09 DIAGNOSIS — R935 Abnormal findings on diagnostic imaging of other abdominal regions, including retroperitoneum: Secondary | ICD-10-CM

## 2022-12-09 DIAGNOSIS — B9689 Other specified bacterial agents as the cause of diseases classified elsewhere: Secondary | ICD-10-CM | POA: Diagnosis present

## 2022-12-09 DIAGNOSIS — I69351 Hemiplegia and hemiparesis following cerebral infarction affecting right dominant side: Secondary | ICD-10-CM

## 2022-12-09 DIAGNOSIS — B961 Klebsiella pneumoniae [K. pneumoniae] as the cause of diseases classified elsewhere: Secondary | ICD-10-CM | POA: Diagnosis present

## 2022-12-09 DIAGNOSIS — G8929 Other chronic pain: Secondary | ICD-10-CM | POA: Diagnosis present

## 2022-12-09 DIAGNOSIS — E861 Hypovolemia: Secondary | ICD-10-CM | POA: Diagnosis present

## 2022-12-09 DIAGNOSIS — Z79899 Other long term (current) drug therapy: Secondary | ICD-10-CM

## 2022-12-09 DIAGNOSIS — R140 Abdominal distension (gaseous): Secondary | ICD-10-CM

## 2022-12-09 LAB — CBC WITH DIFFERENTIAL
Basophils % Auto: 0.4 %
Basophils Abs Auto: 0 10*3/uL (ref 0.0–0.2)
Eosinophils Abs Auto: 0.2 10*3/uL (ref 0.0–0.5)
Hematocrit: 36.6 % — ABNORMAL LOW (ref 41.0–53.0)
Lymphocytes % Auto: 30.4 %
Lymphocytes Abs Auto: 2 10*3/uL (ref 1.0–4.8)
MCH: 31.3 pg (ref 27.0–33.0)
MCHC: 33.9 % (ref 32.0–36.0)
MCV: 92.4 fL (ref 80.0–100.0)
MPV: 8.6 fL (ref 6.8–10.0)
Neutrophils % Auto: 57.8 %
Platelet Count: 225 10*3/uL (ref 130–400)
White Blood Cell Count: 6.4 10*3/uL (ref 4.5–11.0)

## 2022-12-09 LAB — URINALYSIS-COMPLETE
Bilirubin Urine: NEGATIVE
Glucose Urine: NEGATIVE mg/dL
Ketones: NEGATIVE mg/dL
Nitrite Urine: POSITIVE — AB
Occult Blood Urine: NEGATIVE mg/dL
WBC, Urine: 130 /HPF — ABNORMAL HIGH (ref 0–5)
pH URINE: 6 (ref 4.8–7.8)

## 2022-12-09 LAB — POC ABL CRITICAL LAB PANEL
Anion GapC: 8.8 mmol/L (ref 7.0–16.0)
CBase: 1.9 mmol/L
Glucose: 93 mg/dL (ref 70–99)
K+: 4.1 mmol/L (ref 3.3–4.8)
O2 Sat: 77 % (ref 70.0–99.3)
POC ABL90 Temp corrected POC2: 48.5 mmHg (ref 42.0–50.0)
ion Ca: 1.16 mmol/L — ABNORMAL LOW (ref 1.17–1.31)
pH: 7.358 (ref 7.300–7.400)
pO2: 40.8 mmHg

## 2022-12-09 LAB — LIPASE: Lipase: 25 U/L (ref 13–60)

## 2022-12-09 LAB — HEPATIC FUNCTION PANEL
Albumin: 3.8 g/dL — ABNORMAL LOW (ref 4.0–4.9)
Aspartate Transaminase (AST): 13 U/L (ref <=41)

## 2022-12-09 LAB — BASIC METABOLIC PANEL
Calcium: 8.5 mg/dL — ABNORMAL LOW (ref 8.6–10.0)
E-GFR Creatinine (Male): >100 mL/min/{1.73_m2}
Glucose: 94 mg/dL (ref 74–109)
Sodium: 140 mmol/L (ref 136–145)

## 2022-12-09 LAB — MAGNESIUM (MG): Magnesium (Mg): 1.9 mg/dL (ref 1.6–2.4)

## 2022-12-09 LAB — PHOSPHORUS (PO4): Phosphorus (PO4): 3 mg/dL (ref 2.5–4.5)

## 2022-12-09 LAB — TROPONIN T: TROPONIN T: 30 ng/L — ABNORMAL HIGH (ref <=19)

## 2022-12-09 MED ORDER — APIXABAN 5 MG TABLET
5.0000 mg | ORAL_TABLET | Freq: Two times a day (BID) | ORAL | Status: DC
Start: 2022-12-09 — End: 2022-12-12

## 2022-12-09 MED ORDER — LACTATED RINGERS IV BOLUS - DURATION REQ
1000.0000 mL | Freq: Once | INTRAVENOUS | Status: AC
Start: 2022-12-09 — End: 2022-12-09
  Administered 2022-12-09: 1000 mL via INTRAVENOUS

## 2022-12-09 MED ORDER — HYDROCODONE 5 MG-ACETAMINOPHEN 325 MG TABLET: 1.0000 | ORAL_TABLET | ORAL | Status: DC | PRN

## 2022-12-09 MED ORDER — ENOXAPARIN 40 MG/0.4 ML SUBCUTANEOUS SYRINGE
40.0000 mg | INJECTION | Freq: Every day | SUBCUTANEOUS | Status: DC
Start: 2022-12-09 — End: 2022-12-09

## 2022-12-09 MED ORDER — TIZANIDINE 2 MG TABLET
2.0000 mg | ORAL_TABLET | Freq: Three times a day (TID) | ORAL | Status: DC
  Administered 2022-12-09 – 2022-12-12 (×9): 2 mg via ORAL
  Filled 2022-12-09 (×3): qty 1
  Filled 2022-12-09: qty 30
  Filled 2022-12-09 (×7): qty 1

## 2022-12-09 MED ORDER — HYDROCODONE 10 MG-ACETAMINOPHEN 325 MG TABLET: 1.0000 | ORAL_TABLET | Freq: Four times a day (QID) | ORAL | Status: DC | PRN

## 2022-12-09 MED ORDER — LACTULOSE 10 GRAM/15 ML (15 ML) ORAL SOLUTION
30.0000 mL | Freq: Two times a day (BID) | ORAL | Status: DC
Start: 2022-12-09 — End: 2022-12-12
  Administered 2022-12-10 – 2022-12-12 (×5): 30 mL via ORAL
  Filled 2022-12-09 (×6): qty 30

## 2022-12-09 MED ORDER — BISACODYL 5 MG TABLET,DELAYED RELEASE
10.0000 mg | DELAYED_RELEASE_TABLET | ORAL | Status: DC | PRN
Start: 2022-12-09 — End: 2022-12-12

## 2022-12-09 MED ORDER — CEFTRIAXONE 2 GRAM SOLUTION FOR INJECTION
2.0000 g | Freq: Once | INTRAMUSCULAR | Status: AC
Start: 2022-12-09 — End: 2022-12-09
  Administered 2022-12-09: 2 g via INTRAVENOUS
  Filled 2022-12-09: qty 20

## 2022-12-09 MED ORDER — CAMPHOR-MENTHOL 0.5 %-0.5 % LOTION
TOPICAL_LOTION | Freq: Three times a day (TID) | TOPICAL | Status: DC | PRN
Start: 2022-12-09 — End: 2022-12-12

## 2022-12-09 MED ORDER — MORPHINE ER 15 MG TABLET,EXTENDED RELEASE
15.0000 mg | EXTENDED_RELEASE_TABLET | Freq: Two times a day (BID) | ORAL | Status: DC
Start: 2022-12-10 — End: 2022-12-09

## 2022-12-09 MED ORDER — IOHEXOL 350 MG IODINE/ML INTRAVENOUS SOLUTION - 500 ML BOTTLE
INTRAVENOUS | Status: AC
Start: 2022-12-09 — End: 2022-12-09

## 2022-12-09 MED ORDER — NALOXONE 0.4 MG/ML INJECTION SOLUTION: 0.4000 mg | INTRAMUSCULAR | Status: DC | PRN

## 2022-12-09 MED ORDER — DIAZEPAM 5 MG TABLET
10.0000 mg | ORAL_TABLET | Freq: Four times a day (QID) | ORAL | Status: DC
  Administered 2022-12-10 – 2022-12-12 (×10): 10 mg via ORAL
  Filled 2022-12-09 (×10): qty 2

## 2022-12-09 MED ORDER — HYDROCODONE 5 MG-ACETAMINOPHEN 325 MG TABLET
1.0000 | ORAL_TABLET | Freq: Once | ORAL | Status: AC
  Administered 2022-12-09: 1 via ORAL
  Filled 2022-12-09: qty 1

## 2022-12-09 MED ORDER — HYDROCODONE 10 MG-ACETAMINOPHEN 325 MG TABLET
1.0000 | ORAL_TABLET | ORAL | Status: DC | PRN
  Administered 2022-12-10 – 2022-12-12 (×7): 1 via ORAL
  Filled 2022-12-09 (×7): qty 1

## 2022-12-09 MED ORDER — MELATONIN 3 MG TABLET
3.0000 mg | ORAL_TABLET | Freq: Every day | ORAL | Status: DC | PRN
Start: 2022-12-09 — End: 2022-12-12

## 2022-12-09 MED ORDER — ONDANSETRON HCL (PF) 4 MG/2 ML INJECTION SOLUTION
4.0000 mg | Freq: Three times a day (TID) | INTRAMUSCULAR | Status: DC | PRN
Start: 2022-12-09 — End: 2022-12-12

## 2022-12-09 MED ORDER — BACLOFEN 10 MG TABLET
10.0000 mg | ORAL_TABLET | Freq: Three times a day (TID) | ORAL | Status: DC
Start: 2022-12-09 — End: 2022-12-12
  Administered 2022-12-09 – 2022-12-12 (×9): 10 mg via ORAL
  Filled 2022-12-09 (×9): qty 1

## 2022-12-09 MED ORDER — PROCHLORPERAZINE EDISYLATE 10 MG/2 ML (5 MG/ML) INJECTION SOLUTION
5.0000 mg | Freq: Four times a day (QID) | INTRAMUSCULAR | Status: DC | PRN
Start: 2022-12-09 — End: 2022-12-12

## 2022-12-09 MED ORDER — CALCIUM GLUCONATE 2 GRAM/100 ML IN SODIUM CHLORIDE,ISO-OSM IV SOLUTION
2.0000 g | Freq: Once | INTRAVENOUS | Status: AC
Start: 2022-12-09 — End: 2022-12-09
  Administered 2022-12-09: 2 g via INTRAVENOUS
  Filled 2022-12-09: qty 100

## 2022-12-09 MED ORDER — POLYETHYLENE GLYCOL 3350 17 GRAM ORAL POWDER PACKET
17.0000 g | Freq: Every day | ORAL | Status: DC
Start: 2022-12-10 — End: 2022-12-12
  Administered 2022-12-11 – 2022-12-12 (×2): 17 g via ORAL
  Filled 2022-12-09 (×3): qty 1

## 2022-12-09 MED ORDER — MORPHINE ER 15 MG TABLET,EXTENDED RELEASE
15.0000 mg | EXTENDED_RELEASE_TABLET | Freq: Two times a day (BID) | ORAL | Status: DC
Start: 2022-12-09 — End: 2022-12-12
  Administered 2022-12-09 – 2022-12-12 (×6): 15 mg via ORAL
  Filled 2022-12-09 (×6): qty 1

## 2022-12-09 MED ORDER — CEFTRIAXONE 2 GRAM SOLUTION FOR INJECTION
2.0000 g | INTRAMUSCULAR | Status: DC
Start: 2022-12-10 — End: 2022-12-11
  Administered 2022-12-10: 2 g via INTRAVENOUS
  Filled 2022-12-09: qty 20

## 2022-12-09 MED FILL — miconazole nitrate 2 % topical powder: TOPICAL | Qty: 0.94 | Status: AC

## 2022-12-10 DIAGNOSIS — G35 Multiple sclerosis: Secondary | ICD-10-CM

## 2022-12-10 DIAGNOSIS — I10 Essential (primary) hypertension: Secondary | ICD-10-CM

## 2022-12-10 DIAGNOSIS — G8929 Other chronic pain: Secondary | ICD-10-CM

## 2022-12-10 LAB — BASIC METABOLIC PANEL
Calcium: 8.9 mg/dL (ref 8.6–10.0)
Creatinine Serum: 0.63 mg/dL (ref 0.51–1.17)
E-GFR Creatinine (Male): >100 mL/min/{1.73_m2}
Sodium: 142 mmol/L (ref 136–145)
Urea Nitrogen, Blood (BUN): 8 mg/dL (ref 6–20)

## 2022-12-10 LAB — CBC NO DIFFERENTIAL
MCV: 90.9 fL (ref 80.0–100.0)
MPV: 8.8 fL (ref 6.8–10.0)
White Blood Cell Count: 5.9 10*3/uL (ref 4.5–11.0)

## 2022-12-10 LAB — TRANSFERRIN
Iron Total: 72 ug/dL (ref 37–158)
Total Iron Binding Capacity: 249 ug/dL — ABNORMAL LOW (ref 280–400)

## 2022-12-10 MED ORDER — LISINOPRIL 2.5 MG TABLET
5.0000 mg | ORAL_TABLET | Freq: Every day | ORAL | Status: DC
  Administered 2022-12-10 – 2022-12-12 (×3): 5 mg via ORAL
  Filled 2022-12-10 (×3): qty 2

## 2022-12-10 MED ORDER — ENOXAPARIN 40 MG/0.4 ML SUBCUTANEOUS SYRINGE
40.0000 mg | INJECTION | SUBCUTANEOUS | Status: DC
Start: 2022-12-10 — End: 2022-12-12
  Administered 2022-12-10 – 2022-12-12 (×3): 40 mg via SUBCUTANEOUS
  Filled 2022-12-10 (×3): qty 0.4

## 2022-12-11 LAB — CBC NO DIFFERENTIAL
Hemoglobin: 12.1 g/dL — ABNORMAL LOW (ref 13.5–17.5)
MCHC: 34.1 % (ref 32.0–36.0)
MPV: 8.8 fL (ref 6.8–10.0)
Platelet Count: 208 10*3/uL (ref 130–400)
RDW: 12.7 % (ref 0.0–14.7)
Red Blood Cell Count: 3.89 10*6/uL — ABNORMAL LOW (ref 4.50–5.90)

## 2022-12-11 LAB — BASIC METABOLIC PANEL
Anion Gap: 9 mmol/L (ref 7–15)
Calcium: 8.9 mg/dL (ref 8.6–10.0)
Chloride: 105 mmol/L (ref 98–107)
E-GFR Creatinine (Male): >100 mL/min/{1.73_m2}
Glucose: 97 mg/dL (ref 74–109)
Urea Nitrogen, Blood (BUN): 12 mg/dL (ref 6–20)

## 2022-12-11 LAB — PHOSPHORUS (PO4): Phosphorus (PO4): 3.2 mg/dL (ref 2.5–4.5)

## 2022-12-11 LAB — FERRITIN: Ferritin: 82 ng/mL (ref 30–400)

## 2022-12-11 LAB — CULTURE SURVEILLANCE, MRSA

## 2022-12-11 MED ORDER — CEFTRIAXONE 1 GRAM SOLUTION FOR INJECTION
1.0000 g | INTRAMUSCULAR | Status: DC
Start: 2022-12-11 — End: 2022-12-12
  Administered 2022-12-11 – 2022-12-12 (×2): 1 g via INTRAVENOUS
  Filled 2022-12-11 (×2): qty 10

## 2022-12-12 DIAGNOSIS — K089 Disorder of teeth and supporting structures, unspecified: Secondary | ICD-10-CM

## 2022-12-12 DIAGNOSIS — B9689 Other specified bacterial agents as the cause of diseases classified elsewhere: Secondary | ICD-10-CM

## 2022-12-12 DIAGNOSIS — B961 Klebsiella pneumoniae [K. pneumoniae] as the cause of diseases classified elsewhere: Secondary | ICD-10-CM

## 2022-12-12 LAB — CBC NO DIFFERENTIAL
Hemoglobin: 12.2 g/dL — ABNORMAL LOW (ref 13.5–17.5)
MCH: 31.4 pg (ref 27.0–33.0)
Platelet Estimate, Smear: ADEQUATE — AB
RDW: 12.9 % (ref 0.0–14.7)
Red Blood Cell Count: 3.88 10*6/uL — ABNORMAL LOW (ref 4.50–5.90)

## 2022-12-12 LAB — MAGNESIUM (MG): Magnesium (Mg): 2 mg/dL (ref 1.6–2.4)

## 2022-12-12 LAB — BASIC METABOLIC PANEL
Potassium: 4.4 mmol/L (ref 3.4–5.1)
Urea Nitrogen, Blood (BUN): 13 mg/dL (ref 6–20)

## 2022-12-12 NOTE — Nurse Focus (Signed)
Assumed care for RN 45 min break

## 2022-12-12 NOTE — Clinical Case Management (Signed)
Clinical Case Management Documentation    Name: KAHMANI PACIFIC III  MRN: [id number]   Date of Birth: Dec 31, 1970 (52yr) Gender: male    Note Date: 12/12/2022 Note Time: 14:32     Current Isolation(s): No active isolations  Current Infection(s): No active infections      INITIAL ASSESSMENT NOTE    Patient was transferred from Networked reference to record EAF .  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    Patient able to participate in plan?: Yes  Is an interpreter used?: No  Living arrangements: Other (comment)  Type of Residence: long term acute care facility  Is caregiver willing/able to assist this patient with daily activities? (feeding, transferring in/out of bed/chair, dressing, bathing & other daily activities?: Yes  Is caregiver willing/able to receive training to support this patient for above activities?: Yes  Pre-Hospitalization self-care deficits: Bathing, Toileting  Pre-Hospitalization mobility: Dependent with mobility difficulties  Pre-Hospital Services: None  Type of home health care services in place: None  Anticipated Disposition: Long-term SNF  Does the patient have ongoing DC Planning needs?: TBD  Is the patient ready for discharge today?: TBD  SNF Facility Name: [facility]  Ph: [phone]    [address] [place], [place] [address]  Comments: Per MD team  patient is stable for discharge.  CM met with patient at the bedside and he confirmed that he is a long-term resident at [facility], and he is agreeable to return.  This CM placed call to Jorja Loa, [phone] the admissions coordinator.  Per Tim patient is welcome to return.  Accepting MD is Dr. Koren Shiver and the pharmacy is [facility] in [place].  Report can be called to [phone].  CM will arrange transport with [facility] and will also uploade DC Summary and DC SNF orders when available.    Follow-up/recommendations: CM will continue to follow and secure [facility] transportation to [facility].      Purcell Nails Case Manager  Department of Clinical Case Management  [facility]  Phone #: [phone]  Monday - Friday  Office Hours: [phone]      For any discharge planning concerns after 1630, refer to the Daily Assignment Clinical Case Management to determine the covering Case Managers.  It is under the sub-heading After Hours PM Discharge CCM per today's assignment sheet

## 2022-12-12 NOTE — Wound Nurse Consult (Signed)
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       Wound Care Nurse Consult  Consult performed by: Delia Heady, RN  Consult ordered by: Loman Chroman, MD  Reason for consult: [name]      Pictures reviewed under media and discussed with bedside nurse Donnald Garre, who confirmed no groin wound.          Continue pressure injury preventions and moisture management:  Support surface: place patient on EHOB air waffle overlay mattress or bariatric seat cushion to assist with pressure redistribution  Keep turning : reposition every 2 hours or more frequent, 30 degree turns side to side   Incontinence: apply moisture barrier to perineal skin- change incontinent pads when wet to reduce moisture associated skin damage which can worsen pressure injuries   Nutrition: dietician consult if not already done -to optimize nutrition for wound healing     Team notified via secure chat.      Delia Heady, RN,WCC  Wound care team  If you have any questions, please contact me at [facility] - "Wound Care Nurse"

## 2022-12-12 NOTE — Care Plan (Signed)
Problem: Adult Inpatient Plan of Care  Goal: Plan of Care Review  Outcome: Ongoing, Progressing  Flowsheets (Taken 12/12/2022 0356)  Outcome Evaluation: Pt A&Ox4, calm and cooperative w/ care. VSS on RA, afebrile. Did c/o pain this, given Norco x1. Inconintince care provided w/ lift team assist. Right sided deficits, but able to feed self w/ left hand. NAD noted throughout the night. Will continue to monitor.

## 2022-12-12 NOTE — Nurse Focus (Signed)
Assumed care. Repositioning with Lift Team.

## 2022-12-12 NOTE — Nurse Discharge Note (Signed)
Pt discharged to originating SNF via gurney by [facility].  Report given to Nanette at [facility]  2 PIV removed.  AHS, MAR, Discharge Summary, and SNF Admit Orders sent in packet for receiving facility, copies of all provided in pt packet. Personal belongings sent with patient.

## 2022-12-12 NOTE — SNF Discharge (Addendum)
SKILLED NURSING FACILITY ADMIT ORDERS  Date: 12/12/2022   Time: 14:50       PATIENT: Roy Henry 8gn III  MRN:  [id number]  DOB:  1971-01-10    1. Admitted to Inpatient Date: 12/09/22      Discharged to SNF on: 12/12/2022     2. Primary Diagnosis: Hypotension  3. Secondary Diagnoses: Suspected UTI    4. Rehabilitation Potential: fair    5. CODE STATUS: Full  Supportive documentation provided in (progress notes/DC summary/H&P)    6. Hospice: no    7. The patient has the capacity to understand and make informed decisions:   Yes    8. Allergies:   Tegretol [Carbamazepine]    Hives    Comment:Per pt report      9a.  Isolation type : No active isolations  9b.  Infections of Concern: No Infections that Concern Infection Control    10. Diet:   PO -  other easy to chew       11. Activity restriction: As tolerated     12. Vital signs:   Routine per SNF    13. Weight: Weight: 99.3 kg (219 lb) (12/12/22 0500)   Routine on admission, then monthly.    14. TB Screen:   PPD done - no Date: n/a  CXR done - yes Date: 12/09/22    15. Wound care:  Decubitus ulcer prevention    16. Bowel care:   Routine per SNF    17. Foley in place?: no   If present, routine foley care PRN.    18. Scheduled follow up appointments:   Neurology    19. Recommended follow up appointments:  Needs dental appointment-- very poor dentition with multiple decay and teeth falling off     20. Dialysis:   No    21. Therapy Orders to be initiated:   Physical Therapy, Occupational Therapy, and Incentive Spirometry - (for TBD weeks)    22. Additional Interventions:  Antibiotics:  Oral - Type: keflex  Frequency:QID  End Date:12/15/22  Anticoagulants:  Type: prophylaxis   End Date: Stop apixaban when patient more mobile/ /    INR goal: N/A   Next INR: N/A    23. Medications and treatments:     Medication List        CONTINUE taking these medications      Baclofen 10 mg Tablet  Commonly known as: LIORESAL  Take 1 tablet by mouth 3 times daily.     Bisacodyl 10 mg  Suppository  Commonly known as: DULCOLAX  Insert 1 suppository into the rectum every other day.     Cephalexin 500 mg Capsule  Commonly known as: KEFLEX  Take 1 capsule by mouth 4 times daily.     Dalfampridine 10 mg ER Tablet  Commonly known as: AMPYRA  Take 1 tablet by mouth 2 times daily.     Diazepam 5 mg Tablet  Commonly known as: VALIUM  Take 2 tablets by mouth every 6 hours if needed.     Docusate 100 mg Capsule  Commonly known as: COLACE  Take 1 capsule by mouth 2 times daily.     ELIQUIS 5 mg Tablet  Generic drug: Apixaban  Take 1 tablet by mouth 2 times daily.     FLEET ENEMA 19-7 gram/118 mL Enema  Generic drug: Sodium Phosphates  Insert 1 enema into the rectum every third day. (Every 72 hours).     Gabapentin 300 mg Capsule  Commonly known  as: NEURONTIN  Take 1 capsule by mouth 3 times daily.     Hydrocodone-Acetaminophen 10-325 mg Tablet  Commonly known as: NORCO  Take 1 tablet by mouth every 6 hours if needed.     ICY HOT (MENTHOL) 16 % Aerosol Spray  Generic drug: menthol  Apply to the affected area if needed.     KESIMPTA PEN 20 mg/0.4 mL Pen Injector  Generic drug: ofatumumab  Inject 0.4 mL subcutaneously every 4 weeks.     Lactulose 10 gram/15 mL Solution  Commonly known as: CHRONULAC  Take 30 mL by mouth 2 times daily.     Lidocaine 4 % Patch  Apply 1 patch to the affected area every day.     Lisinopril 5 mg Tablet  Commonly known as: PRINIVIL, ZESTRIL  Take 2 tablets by mouth every day.     MILK OF MAGNESIA 400 mg/5 mL Liquid  Generic drug: Magnesium Hydroxide  Take 30 mL by mouth once daily if needed.     Morphine 15 mg SR 12hr Tablet  Commonly known as: MS CONTIN  Take 1 tablet by mouth every 12 hours.     Polyethylene Glycol 3350 17 gram Powder  Commonly known as: MIRALAX  Take 1 packet by mouth once daily if needed for constipation. Mix in 4 to 8 oz of water, soda, coffee, juice or tea.     SENNA 8.6 mg Tablet  Generic drug: Sennosides  Take 2 tablets by mouth every day at bedtime.      TiZANidine 2 mg Tablet  Commonly known as: ZANAFLEX  Take 1 tablet by mouth 3 times daily.     Vitamin D-3 50 mcg (2,000 unit) Tablet  Generic drug: Cholecalciferol  Take 1 tablet by mouth every day.              Note: For each psychotropic medication listed, the patient has capacity or informed consent has been obtained for use for the following diagnosis n/a.    24. Does this patient have a history of CHF?  No

## 2022-12-12 NOTE — Discharge Summary (Signed)
 Roy Henry   DISCHARGE SUMMARY    PATIENT NAME: Roy Henry 8gn III  PATIENT DOB: 1970-10-27     Date of Admission: 12/09/2022  3:28 PM  Date of Discharge: 12/12/22  Attending Physician at time of Discharge: Roy Fordyce, MD    DISCHARGE DIAGNOSES:   Active Hospital Problems    *Complicated UTI (urinary tract infection)      COMMENTS TO PCP  -----------------------------------------------------------------------  Important Comments to Subsequent Providers:    Patient was admitted for hypotension, thought to be secondary to UTI. UA was positive, though urine culture only grew 5k klebsiella/enterobacter. He received IV ceftriaxone and was discharged on PO keflex to complete the total course of 7 days  Unclear when and why patient was started on apixaban. Limited documentation on this medication in chart, though SNF did state it was associated with previous femur fracture in 10/2021. 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 Unclear how long clot has been at the site. Will continue apixaban 5mg  TWICE DAILY, but will need to reassess indication for apixaban in the near future.   Very poor dentition with teeth falling out during admission. Needs dental appointment for assessment ASAP.  Follow-up on chronic anemia    PENDING STUDIES AT DISCHARGE:  N/a    PROCEDURES/SURGERIES PERFORMED DURING THIS ADMISSION:  N/a    CONSULTATIONS MADE DURING THIS ADMISSION:  HOSPITAL MEDICINE CONSULT (ED ONLY)  WOUND CARE NURSE CONSULT    PATIENT MEDICATION LIST ON DISCHARGE:   Current Discharge Medication List        CONTINUE these medications which have CHANGED    Details   Apixaban (ELIQUIS) 5 mg Tablet Take 1 tablet by mouth 2 times daily.           CONTINUE these medications which have NOT CHANGED    Details   Baclofen (LIORESAL) 10 mg Tablet Take 1 tablet by mouth 3 times daily.      Bisacodyl (DULCOLAX) 10 mg Suppository Insert 1  suppository into the rectum every other day.      Cephalexin (KEFLEX) 500 mg Capsule Take 1 capsule by mouth 4 times daily.      Cholecalciferol (VITAMIN D-3) 50 mcg (2,000 unit) Tablet Take 1 tablet by mouth every day.      Dalfampridine (AMPYRA) 10 mg ER Tablet Take 1 tablet by mouth 2 times daily.      Diazepam (VALIUM) 5 mg Tablet Take 2 tablets by mouth every 6 hours if needed.      Docusate (COLACE) 100 mg Capsule Take 1 capsule by mouth 2 times daily.      Gabapentin (NEURONTIN) 300 mg Capsule Take 1 capsule by mouth 3 times daily.      Hydrocodone-Acetaminophen (NORCO) 10-325 mg Tablet Take 1 tablet by mouth every 6 hours if needed.      Lactulose (CHRONULAC) 10 gram/15 mL Solution Take 30 mL by mouth 2 times daily.      Lidocaine 4 % Patch Apply 1 patch to the affected area every day.      Lisinopril (PRINIVIL, ZESTRIL) 5 mg Tablet Take 2 tablets by mouth every day.      Magnesium Hydroxide (MILK OF MAGNESIA) 400 mg/5 mL Liquid Take 30 mL by mouth once daily if needed.      menthol (ICY HOT, MENTHOL,) 16 % Aerosol Spray Apply to the affected area if needed.      Morphine (  MS CONTIN) 15 mg SR 12hr Tablet Take 1 tablet by mouth every 12 hours.      ofatumumab (KESIMPTA PEN) 20 mg/0.4 mL Pen Injector Inject 0.4 mL subcutaneously every 4 weeks.      Polyethylene Glycol 3350 (MIRALAX) 17 gram Powder Take 1 packet by mouth once daily if needed for constipation. Mix in 4 to 8 oz of water, soda, coffee, juice or tea.      Sennosides (SENNA) 8.6 mg Tablet Take 2 tablets by mouth every day at bedtime.      Sodium Phosphates (FLEET ENEMA) 19-7 gram/118 mL Enema Insert 1 enema into the rectum every third day. (Every 72 hours).      TiZANidine (ZANAFLEX) 2 mg Tablet Take 1 tablet by mouth 3 times daily.               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 At SNF blood pressure was 80/40. EMS was called. EMS gave 800cc bolus on arrival with increase in SBP to 100. he was then brought to ER. Continued to be hypotensive thus was further fluid resuscitated with another 2000cc LR. Denied use of chronic indwelling Foley. Denied dysuria but did report pelvic pain. Denies fevers, nausea, vomiting, chest pain, cough, shortness of breath, diarrhea. In the ED UA concerning for UTI. Started on Rocephin. Admitted to Hospital Medicine. He is unfamiliar with some of his medications specifically his antihypertensive regimen however Reported that he may still be taking them and if so has been on lisinopril and amlodipine for least the past year without recent dose changes.     HOSPITAL COURSE:   Patient was treated with 4 days of IV ceftriaxone for suspected UTI. Urine culture grew 5k klebsiella/enterobacter. Vitals remain stable. Apixaban was initially held during hospitalization as it was not clear for the indication of the medication. 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 Apixaban was continued on dc. Patient had teeth fallen out after eating some almonds. Will need outpatient dental appointment asap.     PATIENT CONDITION AT DISCHARGE  PHYSICAL EXAM:  Vitals: BP 107/71   Pulse 56   Temp 36.4 C (97.6 F) (Oral)   Resp 16   Wt 99.3 kg (219 lb)   SpO2 95%    General: Alert and oriented  HEENT: PERR. Hearing grossly intact. Poor dentition.  CVS: RRR without murmurs, rubs, or gallops  Lungs: Clear to auscultation bilaterally.  No wheezes, or rhonchi.  Abd: Soft, LLQ tender to palpation   Ext: No cyanosis, clubbing, or edema.    Skin: Warm and well-perfused.  No rash  Neuro: bilateral LE and right > left UE motor deficits with contractures   MSK: trace BLE edema    Pertinent Lab, Study, and Image  Findings  CBC:  BMP:   Recent labs for the past 72 hours     12/09/22  1550 12/10/22  0440 12/11/22  0305 12/12/22  0428   WBC 6.4 5.9 5.2 6.4   HGB 12.4* 11.4* 12.1* 12.2*   PLT 225 199 208  --     Recent labs for the past 72 hours     12/09/22  1550 12/10/22  0440 12/11/22  0305 12/12/22  0428  NA 140 142 140 142   K 4.2 4.0 4.0 4.4   CL 105 107 105 105   CO2 25 25 26 26    BUN 11 8 12 13    CR 0.68 0.63 0.62 0.69   GLU 94 74 97 79        Pertinent Imaging:   CT ABDOMEN + PELVIS WITH CONTRAST   Final Result   IMPRESSION:    1.   Mild bilateral lower lobe and lingular atelectasis.    2.   Several bowel anastomoses are noted. No bowel obstruction or distension.    Mild stool and gas is noted throughout the colon.    3.   Mild pancreatic atrophy for age.    4.   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    5.   Small fat filled right inguinal hernia.    6.   Otherwise negative CT abdomen/pelvis.       THIS DOCUMENT HAS BEEN ELECTRONICALLY SIGNED BY Garth Schlatter, MD      CT HEAD WITHOUT CONTRAST   Final Result   IMPRESSION:    Chronic changes in the brain but no acute intracranial abnormality.       THIS DOCUMENT HAS BEEN ELECTRONICALLY SIGNED BY Saddie Benders, MD      DX CHEST 1 VIEW   Final Result             Micro:   Microbiology Results (In The Last 14 Days)       Collected      12/10/2022 1819 Component Value   Test Result NEGATIVE for C. difficile            12/10/2022 0447 Component Value   CULTURE SURVEILLANCE, MRSA No MRSA isolated            12/09/2022 2056 Component Value   URINE CULTURE 5,000 CFU/mL Presumptive Klebsiella/Enterobacter            12/09/2022 1612 Component Value   CULTURE BLOOD No growth after 2 days  [P]             12/09/2022 1550 Component Value   CULTURE BLOOD No growth after 2 days  [P]                      DISCHARGE INSTRUCTIONS:  No discharge procedures on file.     No discharge procedures on file.     No discharge procedures on  file.      MEDICAL FOLLOW-UP CARE PLAN:  Patient advised to followup with Primary Care Provider, Specialist as instructed in discharge orders.  Followup appointments: No future appointments.  Recommended Follow Up Appo

## 2022-12-12 NOTE — Nurse Assessment (Signed)
ASSESSMENT NOTE    Note Started: 12/12/2022, 11:28     Initial assessment completed and recorded in EMR.  Report received from night shift nurse and orders reviewed. Plan of Care reviewed and appropriate, discussed with patient.  Pt reports loss of tooth last night while eating almonds, denies oral discomfort.  Lift Team assistance with incontinence care and repositioning.  Will continue to monitor.  Antonietta Barcelona, RN

## 2022-12-13 ENCOUNTER — Encounter: Payer: Self-pay | Admitting: Student in an Organized Health Care Education/Training Program

## 2022-12-14 LAB — CULTURE BLOOD, BACTI (INCLUDES YEAST): CULTURE BLOOD: NO GROWTH

## 2022-12-15 DIAGNOSIS — R001 Bradycardia, unspecified: Secondary | ICD-10-CM

## 2022-12-15 LAB — ELECTROCARDIOGRAM WITH RHYTHM STRIP: QTC: 403

## 2022-12-19 NOTE — MD Query (Signed)
MEDICAL RECORD INPATIENT CODING UNIT   CLINICAL DOCUMENTATION IMPROVEMENT REQUEST       Patient Name: Roy Henry Acct: [id number]   MRN: [id number] Admit Date: 12/09/2022   Physician: Kearney Hard, MD Disch Date: 12/12/2022       Additional documentation/clarification is needed to ensure that the severity of illness, risk of mortality, and DRG are accurate for your patient's encounter.     The following clinical information needs clarification. Please see my question(s) below. To respond, addend and sign this query. Your addendum and signature will be filed as a permanent note in the patient's legal medical record.      The medical record reflects the following clinical findings, treatment, and risk factors.    Clinical Clarification: Circulatory Status/Hypotension     Clinical Findings:   Indicators and compensating mechanisms:   ED:   BP 87/76,  84/70  Bedside US with very collapsible IVC consistent with hypovolemia/distributive shock. Treated with crystalloid bolus with improvement to hemodynamics.     5/25 PN:   Does not meet other SIRS criteria at this time     H&P:   presented with symptomatic hypotension associated with blurry vision, generalized weakness, lightheadedness without syncope. At SNF blood pressure was 80/40.  EMS gave 800cc bolus on arrival with increase in SBP to 100. he was then brought to ER. Continued to be hypotensive thus was further fluid resuscitated with another 2000cc LR     D/S:  Patient was admitted for hypotension, thought to be secondary to UTI.      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    Please exercise your independent professional judgment in your response and include it in your progress note as applicable.    ANSWER:   Hypotension without shock     Thank you,    Ginger Copy  Health Information Management (HIM) Division  Patient Financial Services  [phone]

## 2023-01-02 ENCOUNTER — Ambulatory Visit
Payer: Medicare Other | Attending: Student in an Organized Health Care Education/Training Program | Admitting: Student in an Organized Health Care Education/Training Program

## 2023-01-02 VITALS — BP 94/66 | HR 60 | Temp 97.5°F | Ht 73.0 in | Wt 203.0 lb

## 2023-01-02 DIAGNOSIS — G8389 Other specified paralytic syndromes: Secondary | ICD-10-CM

## 2023-01-02 DIAGNOSIS — G35 Multiple sclerosis: Secondary | ICD-10-CM | POA: Insufficient documentation

## 2023-01-02 NOTE — Patient Instructions (Signed)
Dear Roy Henry,     Thank you for visiting us in the [facility] today. You were seen by me, Dr. Delina Kruczek in treatment for spasticity. Thank you for your patience today, sometimes our visits in the neurology clinic may take longer than expected.     Here is a summary of the plan we discussed today:     After your botox:  - Please do not wash the area, massage or put any lotion on the area for 24 hours, then go back to your regular activities   - Things to watch for: drooping in eyebrow or eyelid, or weakness in neck/shoudlers.  - Other possible reactions: flu-like symptoms for a couple days (use Tylenol, or Ibuprofen, or Naproxen as needed to help), worsened headache, (very rare an allergic reaction)      ---------------------------------------------------------    Imaging: If I have ordered imaging for you such as a CT or MRI you will need to call the radiology department to schedule it at [phone]; it may take 1-2 weeks for the test to be approved by your insurance.     Labs: you may have your labs done at our lab located on the main level of our clinic building, [address]; the lab hours are 7:30AM - 5:30PM. You may have your labs drawn at any other [facility] - the hours and location of which can be found online - [email or web address].     Referrals and other tests: You will be called to schedule new referrals or other tests once authorized by your insurance. If you do not hear from the department that you have been referred to, you can call our clinic for assistance.     ---------------------------------------------------------    Medications:     Medications ordered or refilled by me are sent electronically to your pharmacy.     Please always check the number of refills you have prior to our visits in the future so that you may request refills in person if necessary. Additionally, refills may be requested through your pharmacy  or by calling my office. It is important that you stay on top of your need for refills.     Always notify me if you are having side effects to your medications or if you would like to stop or decrease your medication - you should not do this on your own.     Missing doses of medications can cause your treatment to be ineffective; let me know if you are having trouble remembering to take your medication.     You should always carry a complete medication list with you at all times - such as in your wallet or smart phone. Include the name, dose, and schedule. Keep this list up to date.     ---------------------------------------------------------    Important note regarding messaging (MyChart):     I am happy to communicate with you in between visits for questions or concerns related to your neurologic condition, medication side effects or other concerns that can not wait until your follow up visit. I will do my best to address your concerns within 3-5 business days but there are many factors that may affect this timeline. Messages do not come directly to me but are first reviewed by our clinic staff who may be able to help with your issue. Do not use MyChart for urgent matters or emergencies. If there is an urgent matter you may call the clinic for assistance or contact your primary   care provider. For emergencies go to your nearest emergency room or call 911.          Regards,     Dr. Henreitta Spittler

## 2023-01-02 NOTE — Procedures (Signed)
BOTULINUM TOXIN INJECTION    Roy Henry is a 52yr-old previously right-handed (now with R arm weakness) man who presents for follow up for spasticity due to multiple sclerosis.     With initiation of injections he has had benefit of allowing him to sit up in a wheelchair and bed more and had more flexibility of his lower extremities for care which has continued to improve. He does not note any wearing off.     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    The area was cleaned with alcohol and EMG ground leads were placed on antagonist muscle groups.     Botulinum toxin type A - Xeomin (100 units in 2cc normal saline dilution x6) was injected under EMG guidance as follows:    MUSCLE                                        UNITS                EMG ACTIVITY  R gastrocnemius:    25 units  +  R anterior tibialis    25 units  +  R posterior tibialis   25 units  +  R bicep femorus   75 units  +  R semitendinosis    75 units   +  R adductor magnus   75 units  +    L gastrocnemius:    25 units   +  L anterior tibialis    25 units  +  L posterior tibialis   25 units  +  L bicep femorus   75 units  +  L semitendinosis    75 units   +  L adductor magnus    75 units   +      Total dose injected (in units)  600  Wastage (in units)    0    The patient tolerated the procedure well with no complications.    Expectations from botulinum toxin injections were explained to Roy Henry and was told that he may experience immediate benefit as a result of local injection site response, however true effect of the toxin is not expected for 2-4 weeks.  The patient was instructed to monitor response carefully over the next few  months.    I spent 45 min on this procedure.     Electronically signed by:   Erven Colla, DO  Assistant Clinical Professor  Department of Neurology  [facility]

## 2023-01-02 NOTE — Nursing Note (Signed)
Patient ID x 2. Vital signs taken, screened for pain, allergies and preferred pharmacy verified.    Clem Wisenbaker Del Toro, CMA II

## 2023-01-25 ENCOUNTER — Encounter: Payer: Self-pay | Admitting: Family

## 2023-01-25 ENCOUNTER — Other Ambulatory Visit: Payer: Self-pay | Admitting: Family

## 2023-01-25 DIAGNOSIS — M24561 Contracture, right knee: Secondary | ICD-10-CM

## 2023-01-25 DIAGNOSIS — G35 Multiple sclerosis: Secondary | ICD-10-CM

## 2023-01-25 NOTE — Telephone Encounter (Signed)
From: Hall Busing III  To: Kennis Carina  Sent: 01/25/2023 8:57 AM PDT  Subject: Physical and occupational therapy    Janelle butters I had The understanding that I would have physical therapy continuous for the year, but the facility I reside at said I have exhausted my Physical therapy sessions for the year it's the beginning of July somehow, they mismanaged my my therapy. I've continued to show improvement Now I'm going to receive my Botox and not get any therapy to improve my independence I'm not understanding how they used up all my therapy in six months and if you can look into it for me the answers I got they said my insurance won't cover it anymore [name]

## 2023-02-02 MED FILL — onabotulinumtoxinA 100 unit solution for injection: INTRAMUSCULAR | Qty: 600 | Status: AC

## 2023-02-07 ENCOUNTER — Other Ambulatory Visit: Payer: Self-pay

## 2023-02-07 NOTE — Progress Notes (Signed)
Opt out of  [facility] , pt in a care home.     Task  removed.  Will continue to call  for  medication deliveries to  Med tech      [name]  Specialty Technician  Specialty Pharmacy   P: [phone] Option 9 then  #4  02/07/23  2:46 PM

## 2023-02-14 ENCOUNTER — Other Ambulatory Visit: Payer: Self-pay

## 2023-02-14 DIAGNOSIS — Z5181 Encounter for therapeutic drug level monitoring: Secondary | ICD-10-CM

## 2023-02-14 DIAGNOSIS — G35 Multiple sclerosis: Secondary | ICD-10-CM

## 2023-02-14 NOTE — Progress Notes (Signed)
Reason for Telephone: Medication Adherence Follow-up Kesimpta    Reviewed Home Medication List: , no new changes in medications were noted.     Med Adherence Screen (Number doses missed/Number tablets remaining): Patient notes that they have not missed any doses. Previous dose given  7/10- per  Jasmin nurse at [facility]    Adverse Drug Events/Toxicities: None       Telephone follow-up in 28 days, but patient is encouraged to contact pharmacist sooner if any issue arises    Additional Notes: Medication to be delivered on 8/1    Pt has  1 pen on hand  for next dose, delivery confirmed         [name]  Specialty Technician  Specialty Pharmacy   P: [phone] Option 9 then  #4  02/14/23  1:57 PM

## 2023-02-14 NOTE — Progress Notes (Signed)
Specialty Pharmacy Telephone Encounter: Medication Fill Coordination     Roy Henry  [id number]    Called patient to coordinate medication(s) to be filled at [facility]:    [name] Pen 20mg /0.4 ml #1.2 ml    Telephone follow-up will be scheduled prior to the next refill due date, but patient is encouraged to contact pharmacy sooner if any issues arise.     Schedule Ship/Pick Up Date:  7/31  Contact Person:   Patient 's nurse Roy Henry  Number of Supplies Left in Days: 30    Credit Card on File:   N/A- $0 Copay    Initials &  Program Carrier Method Signature Option    [facility]    FedEx Overnight  No Signature Required        [name]  Specialty Technician  Specialty Pharmacy   P: [phone] Option 9 then  #4  02/14/23  1:59 PM

## 2023-02-20 NOTE — Telephone Encounter (Signed)
Left message on voicemail advising pt to check MyChart messages in regards to MyChart message from [place], as well as MyChart message regarding upcoming appointment that was rescheduled.    Maryjo Rochester, RN  Neurology

## 2023-02-22 NOTE — Telephone Encounter (Signed)
Patient identifiers confirmed with patient: Name, DOB, Phone number, address.     Patient called in to confirm botox apt with Dr. Chipper Herb. He also was asking about PT/OT order. He believes that they used all OT / PT visits within a few months when it expires in 57yr from order date. Explained authorization process with insurance at times will have an extended [place] date of service (for example, 26yr auth) but only enough visits authorized for a few months. Offered new ongoing order as he states that OT & PT were very helpful with straightening and strengthening legs.   Anyway we could create new order and send to facility for continuation if appropriate?    Thank you,    Patients best contact: [phone]     Emmaline Kluver [facility] Neurology  Patient line: [phone]

## 2023-02-22 NOTE — Telephone Encounter (Signed)
Telephone call to patient, 3 pt identifiers used. Confirmed with pt that next apt with Dr. Chipper Herb is on 04/20/23 at 8AM.  Christy Gentles, NP message about PT and OT being re-ordered and awaiting authorization.  Pt verbalized understanding and states will check MyChart message when he gets the chance.    Jerilee Hoh, RN  Neurology

## 2023-03-07 ENCOUNTER — Encounter: Payer: Self-pay | Admitting: Family

## 2023-03-21 ENCOUNTER — Telehealth: Payer: Self-pay | Admitting: Family

## 2023-03-21 NOTE — Telephone Encounter (Signed)
Patient identifiers confirmed with patient: Name, DOB, Phone number, address.     Lorena, Interior and spatial designer of nursing in [facility] calling in stating that they are trying to fill patients   Dalfampridine [facility]) 10 mg ER Tablet   However it is getting kicked back stating that this is not on the formulary. She was under the impression that patient has been receiving this medication for quite some time and unsure why its now being kicked back and if there is a medication on the formulary that would be appropriate to change to. Patient is under the impression that he shouldn't be on this medication anymore.    Please advise,    Lorena RN: [phone]    Emmaline Kluver [facility] Neurology  Patient line: [phone]

## 2023-03-22 ENCOUNTER — Other Ambulatory Visit: Payer: Self-pay

## 2023-03-22 NOTE — Telephone Encounter (Signed)
Will [facility]up [facility] see [facility]- [facility]. Mention in chart notes that pt was only able to use BRAND Ampyra since he tried/failed the generic and that he was notified by his insurance they would no longer be covering Ampyra.     Will have techs check to see if new PA is needed if pt to resume BRAND Ampyra.     12/12/22 Scr 0.69, CrCl ~158ml/min.    Emiliano Dyer) Thad Ranger, PharmD, CSP, MPH  Sr. Clinical Specialty Pharmacist   PHN [phone] ext [phone]

## 2023-03-22 NOTE — Telephone Encounter (Signed)
Telephone call to Hulda Marin from [facility]. X3 patient identifiers used.    Lorena states that patient's  Ampyra usually comes from [facility] pharmacy but for the last 3-4 months they have not been receiving it.     She states she has to override to get it from their pharmacy and its very expensive. She states they have been trying to contact clinic but no response.    Per chart review 09/22/22 Karolee Stamps, NP notes  - Continue Ampyra 10 mg bid for weakness in the arms and legs.    Informed Lorena, will take a look at this will give them update. She was appreciative of the call.    Marvie Calender. Bosie Helper, RN  [facility] Neurology [facility]

## 2023-03-23 ENCOUNTER — Other Ambulatory Visit: Payer: Self-pay

## 2023-03-23 NOTE — Telephone Encounter (Signed)
Specialty Pharmacy Medication Dispense History    Hello,    When running the Dispense History Report I show that [facility] filling [facility] the pt. I do not show that [facility] or [facility] has filled before. Please let me know if you need anything further.        Thank you,  Nicoletta Ba, CPhT  Pharmacy Technician III  Specialty Pharmacy   Phone [phone], Option 9, Option 9  03/23/2023-9:11 AM

## 2023-03-23 NOTE — Progress Notes (Signed)
PA needed for  Brand and  Generic Apmyra           Pt can't take  generic?   RPH ok  to  proceed with pa ?  Brand or Generic ?     Roy Henry  Specialty Technician  Specialty Pharmacy   P: [phone] Option 9 then  #4  03/23/23  1:44 PM

## 2023-03-24 ENCOUNTER — Other Ambulatory Visit: Payer: Self-pay

## 2023-03-24 MED ORDER — DALFAMPRIDINE ER 10 MG TABLET,EXTENDED RELEASE,12 HR
10.0000 mg | EXTENDED_RELEASE_CAPSULE | Freq: Two times a day (BID) | ORAL | 3 refills | Status: DC
Start: 2023-03-24 — End: 2024-02-05
  Filled 2023-03-24: qty 180, 90d supply, fill #0
  Filled 2023-05-08 – 2023-05-30 (×2): qty 180, 90d supply, fill #1
  Filled 2023-10-25: qty 60, 30d supply, fill #2
  Filled 2023-11-03: qty 60, 30d supply, fill #3
  Filled 2023-11-13: qty 180, 90d supply, fill #3

## 2023-03-24 NOTE — Progress Notes (Signed)
Specialty Pharmacy Prior Authorization Status    Prior authorization for  Dalfampridine ER 10MG  er tablets  was submitted through [facility] to   [facility] Electronic Prior Authorization                        .     CMM Key: [id number]  Case Ref#  [id number]    Will provide status update once a final decision has been determined.    [name]  Specialty Technician  Specialty Pharmacy   P: [phone] Option 9 then  #4  03/24/23  11:05 AM

## 2023-03-24 NOTE — Progress Notes (Signed)
Specialty Pharmacy - Neurology Note:    [address] [facility]: [phone]) and spoke to RN supervisor Pattricia Boss to confirm that future refills of dalfampridine ER (Ampyra) are not ordered by the SNF and that future refills will instead be initiated by [facility] and fulfilled by [facility]. RN supervisor confirmed documenting that dalfampridine ER will no longer be re-ordered by the SNF.    Called and explained to patient that he has been receiving this medication via the SNF rather than [facility], but that we are able to take over ordering of this medication. Patient verbalized understanding. Also confirmed whether patient is okay with continuing to receive generic formulation dalfampridine ER. The most recent refills (ordered by SNF) have been for the generic. Patient confirmed he is okay with continuing to receive generic rather than the brand.     No less than 5 minutes were spent on patient education and counseling.    Rosemarie Beath, PharmD  PGY-1 Ambulatory Care Pharmacy Resident  [phone] option 9, then 4

## 2023-03-24 NOTE — Progress Notes (Signed)
Specialty Pharmacy Prior Authorization Status Update    Dalfampridine ER 10MG  er tablets Prior authorization has been APPROVED     Authorization Date: 03/24/23 until 07/16/98     Pharmacy:  [facility] 90 days $0  ( tech will  set up  delivery  )     Will notify Clinical Pharmacist of patient's prior authorization status to determine next appropriate steps.      [name]  Specialty Technician  Specialty Pharmacy   P: [phone] Option 9 then  #4  03/24/23  2:01 PM

## 2023-03-24 NOTE — Progress Notes (Signed)
This patient was evaluated, and care plan was developed with the resident. I was present for pt counseling. I agree with the assessment and plan as outlined in the resident's note.    Jule Ser, PharmD, CSP, MPH  Sr. Clinical Specialty Pharmacist   PHN [phone] ext [phone]

## 2023-03-24 NOTE — Addendum Note (Signed)
Addended byRosemarie Beath on: 03/24/2023 09:34 AM     Modules accepted: Orders

## 2023-03-24 NOTE — Progress Notes (Signed)
Specialty Pharmacy Telephone Encounter: Medication Fill Coordination     Roy Henry  [id number]    Called patient to coordinate medication(s) to be filled at [facility]:    Dalfampridine 10 mg Tab # 180     Telephone follow-up will be scheduled prior to the next refill due date, but patient is encouraged to contact pharmacy sooner if any issues arise.     Schedule Ship/Pick Up Date:  9/9  Contact Person:   Patient 's nurse Pattricia Boss  Number of Supplies Left in Days: 0    Credit Card on File:   N/A- $0 Copay      Initials &  Program Carrier Method Signature Option    [facility]   FedEx Overnight  No Signature Required      [name]  Specialty Technician  Specialty Pharmacy   P: [phone] Option 9 then  #4  03/24/23  2:20 PM

## 2023-03-24 NOTE — Progress Notes (Signed)
Reason for Telephone: Medication Adherence Follow-up Dalfampridine     Reviewed Home Medication List: , no new changes in medications were noted.     Med Adherence Screen (Number doses missed/Number tablets remaining): Patient notes that they have missed doses. No meds on hand- unsure of how many  doses missed     Adverse Drug Events/Toxicities: None       Telephone follow-up in 28 days, but patient is encouraged to contact pharmacist sooner if any issue arises    Additional Notes: Medication to be delivered on 9/10        [facility]  Specialty Technician  Specialty Pharmacy   P: [phone] Option 9 then  #4  03/24/23  2:16 PM

## 2023-03-27 NOTE — Telephone Encounter (Signed)
Addressed in separate encounter, no further action needed. [facility] will begin filling Rx for pt and sending to SNF with his Kesimpta.     Emiliano Dyer) Thad Ranger, PharmD, CSP, MPH  Sr. Clinical Specialty Pharmacist   PHN [phone] ext [phone]

## 2023-03-30 ENCOUNTER — Ambulatory Visit: Payer: Medicare Other | Attending: Neurology | Admitting: Neurology

## 2023-03-30 VITALS — BP 106/73 | HR 69 | Temp 97.1°F | Resp 14 | Ht 73.0 in | Wt 200.0 lb

## 2023-03-30 DIAGNOSIS — G8389 Other specified paralytic syndromes: Secondary | ICD-10-CM | POA: Insufficient documentation

## 2023-03-30 DIAGNOSIS — M2450 Contracture, unspecified joint: Secondary | ICD-10-CM | POA: Insufficient documentation

## 2023-03-30 DIAGNOSIS — N390 Urinary tract infection, site not specified: Secondary | ICD-10-CM | POA: Insufficient documentation

## 2023-03-30 DIAGNOSIS — G35 Multiple sclerosis: Secondary | ICD-10-CM | POA: Insufficient documentation
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
## 2023-03-30 NOTE — Nursing Note (Signed)
The pt was identified by using two patient identifiers: Name & DOB. Vital signs measured, drug allergies reviewed, and preferred pharmacy selected. Oliver Hum MA II    Vitals:    03/30/23 1235   BP: 106/73   SITE: left arm   Orthostatic Position: lying   Cuff Size: regular   Pulse: 69   Resp: 14   Temp: 36.2 C (97.1 F)   TempSrc: Skin   Weight: 90.7 kg (200 lb)   Height: 1.854 m (6\' 1" )

## 2023-04-03 ENCOUNTER — Ambulatory Visit: Payer: Medicare Other | Admitting: Student in an Organized Health Care Education/Training Program

## 2023-04-04 ENCOUNTER — Ambulatory Visit: Payer: Medicare Other | Admitting: Student in an Organized Health Care Education/Training Program

## 2023-04-05 ENCOUNTER — Ambulatory Visit: Payer: Medicare Other | Admitting: Student in an Organized Health Care Education/Training Program

## 2023-04-14 ENCOUNTER — Other Ambulatory Visit: Payer: Self-pay

## 2023-04-24 ENCOUNTER — Encounter: Payer: Self-pay | Admitting: Family

## 2023-05-01 ENCOUNTER — Other Ambulatory Visit: Payer: Self-pay

## 2023-05-02 ENCOUNTER — Encounter: Payer: Self-pay | Admitting: Family

## 2023-05-03 ENCOUNTER — Ambulatory Visit: Payer: Medicare Other | Attending: Neurology | Admitting: Neurology

## 2023-05-03 ENCOUNTER — Encounter: Payer: Self-pay | Admitting: Family

## 2023-05-03 ENCOUNTER — Encounter: Payer: Self-pay | Admitting: Neurology

## 2023-05-03 VITALS — BP 112/86 | HR 67 | Temp 97.2°F | Ht 73.0 in

## 2023-05-03 DIAGNOSIS — G35 Multiple sclerosis: Secondary | ICD-10-CM

## 2023-05-03 DIAGNOSIS — R252 Cramp and spasm: Secondary | ICD-10-CM | POA: Insufficient documentation

## 2023-05-03 DIAGNOSIS — G8389 Other specified paralytic syndromes: Secondary | ICD-10-CM | POA: Insufficient documentation

## 2023-05-03 NOTE — Patient Instructions (Signed)
1) no harsh physical treatments to the area of injection for 72 hours  2) no chemical applications to the area of injection for 48 hours  3) you may have mild pain and discomfort to the area of injection for up to 72 hours, and can use tylenol 325-500mg  upto four times a day if needed  4) return to clinic in 3 months for next injection appointment with 600 units of xeomin with  EMG    Expectations from botulinum toxin injections were explained to Roy Henry and was told that he may experience immediate benefit as a result of local injection site response, however true effect of the toxin is not expected for 2-4 weeks. The patient was instructed to monitor response carefully over the next few months.
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
## 2023-05-03 NOTE — Nursing Note (Signed)
Two patient identifiers were used, vitals signs taken, screened for pain, allergies and pharmacy verfied.  Roy Lacaze Derry Skill, MA II

## 2023-05-04 NOTE — Telephone Encounter (Signed)
Medical Exemption Enrollment forms, latest 2 chart notes, and letter have been faxed to [facility] as requested [phone] with fax confirmation received.  Copy to be scanned into EMR.    Jerilee Hoh, RN  Neurology

## 2023-05-08 ENCOUNTER — Other Ambulatory Visit: Payer: Self-pay

## 2023-05-08 NOTE — Progress Notes (Signed)
Spoke with pt's nurse Deliah .  Pt  currently has 1 pen on  hand , next dose  due 10/23.  Will call  back  to schedule  both  Dalfampridine and Kesipmta  on 11/12.        Refill task updated      [name]  Specialty Technician  Specialty Pharmacy   P: [phone] Option 9 then  #4  05/08/23  2:47 PM

## 2023-05-09 NOTE — Telephone Encounter (Signed)
Medical Exemption Enrollment forms, latest 2 chart notes, and letter have been re-faxed to [facility] as requested [phone] with fax confirmation received.     Jerilee Hoh, RN  Neurology

## 2023-05-16 ENCOUNTER — Other Ambulatory Visit: Payer: Self-pay

## 2023-05-17 MED FILL — incobotulinumtoxinA 100 unit intramuscular solution: INTRAMUSCULAR | Qty: 600 | Status: AC

## 2023-05-22 ENCOUNTER — Other Ambulatory Visit: Payer: Self-pay

## 2023-05-22 NOTE — Addendum Note (Signed)
Addended by: Emiliano Dyer on: 05/22/2023 02:27 PM     Modules accepted: Orders

## 2023-05-30 ENCOUNTER — Other Ambulatory Visit: Payer: Self-pay

## 2023-05-30 NOTE — Progress Notes (Signed)
Reason for Telephone: Medication Adherence Follow-up Kesimpta and  Dalfampridine     Reviewed Home Medication List: , no new changes in medications were noted.     Med Adherence Screen (Number doses missed/Number tablets remaining): Patient notes that they have not missed any doses. Next Kesimpta dose due 11/27  has  over 30 days of dalfampridine     Adverse Drug Events/Toxicities: None       Telephone follow-up in 28 days, but patient is encouraged to contact pharmacist sooner if any issue arises    Additional Notes: Medication to be delivered on 11/15        [facility]  Specialty Technician  Specialty Pharmacy   P: [phone] Option 9 then  #4  05/30/23  11:02 AM

## 2023-05-30 NOTE — Progress Notes (Signed)
Specialty Pharmacy Telephone Encounter: Medication Fill Coordination     DASHEL NULF  [id number]    Called patient to coordinate medication(s) to be filled at [facility]:    Dalfampridine 10 mg tab #  180  Kesimpta 20mg /0.4 ml Pen  # 1.2  ml    Telephone follow-up will be scheduled prior to the next refill due date, but patient is encouraged to contact pharmacy sooner if any issues arise.     Schedule Ship/Pick Up Date:  11/14  Contact Person:   Patient 's nurse Gardiner Ramus   Number of Supplies Left in Days: 15    Credit Card on File:   N/A- $0 Copay    Initials &  Program Carrier Method Signature Option    [facility]   [employer] Overnight  No Signature Required        [name]  Specialty Technician  Specialty Pharmacy   P: [phone] Option 9 then  #4  05/30/23  11:07 AM

## 2023-06-01 ENCOUNTER — Other Ambulatory Visit: Payer: Self-pay

## 2023-08-01 ENCOUNTER — Other Ambulatory Visit: Payer: Self-pay

## 2023-08-01 NOTE — Progress Notes (Addendum)
 Called [facility] [phone] to have pt complete q49mo kesimpta/ampyra monitoring labs. Faxed over 9 orders to [facility] alt fax # [phone]. Spoke with Nurse Huy who confirmed will have pt complete labs in a couple of days (by 08/03/23). Provided fax number [phone] for Huy to send results back.      [facility]  Specialty Technician  Specialty Pharmacy  P: [phone], Option # 9, #9  08/01/23 2:45 PM

## 2023-08-02 ENCOUNTER — Telehealth: Payer: Self-pay | Admitting: Neurology

## 2023-08-02 NOTE — Telephone Encounter (Signed)
 Patient identifiers confirmed with patient: Name, DOB, Phone number, address.     Patient is requesting a order for MRI of back is placed due to issues he has been experiencing with his lower spine. He states due to the pain, he is being treated ER Morphine at 8 am and 8 pm to help with some of the pain. Please advise.     Patient is also scheduled for Botox appointment with Dr.Zhang on 08/14/23 and asks to please fax over the appointment information to his assisted living facility.     Ph: [phone]   Next Ov: 08/14/23    Nathaniel Bald, PSR II  [facility] Neurology

## 2023-08-10 ENCOUNTER — Other Ambulatory Visit: Payer: Self-pay

## 2023-08-10 DIAGNOSIS — G35 Multiple sclerosis: Secondary | ICD-10-CM

## 2023-08-10 MED ORDER — KESIMPTA PEN 20 MG/0.4 ML SUBCUTANEOUS PEN INJECTOR
20.0000 mg | PEN_INJECTOR | SUBCUTANEOUS | 3 refills | Status: DC
  Filled 2023-08-10 – 2023-08-21 (×2): qty 1.2, 84d supply, fill #0
  Filled 2023-11-03 – 2023-11-13 (×2): qty 1.2, 84d supply, fill #1
  Filled 2024-01-31 – 2024-02-05 (×2): qty 1.2, 84d supply, fill #2
  Filled 2024-04-11: qty 1.2, 84d supply, fill #3

## 2023-08-10 NOTE — Progress Notes (Signed)
Specialty Pharmacy Prescription Renewal Encounter:    Received prescription renewal request for Kesimpta (ofatumumab) 20 mg/0.4 mL pen: Inject 1 pen subcutaneously every 4 weeks (#1.2 mL or 3 pens, 3RF)    Last fill date: 06/01/23 (#1.62mL/84ds) from [facility]  Last clinic visit: 03/30/23 Inetta Fermo, MD)  - continue Kesimpta for DMT  - continue Ampyra 10 mg bid for weakness in the arms and legs  - CMP, IgG, IgM, IgA while on Kesimpta  Next upcoming clinic visit: 09/27/23 Debby Bud, NP)     Pertinent monitoring:  Last Chemistry Panel Result(s)  Lab Results   Component Value Date    NA 142 12/12/2022    K 4.4 12/12/2022    CL 105 12/12/2022    CO2 26 12/12/2022    BUN 13 12/12/2022    CR 0.69 12/12/2022    GLU 79 12/12/2022    CA 8.6 12/12/2022    TP 6.9 12/09/2022    ALB 3.8 (L) 12/09/2022    ALP 70 12/09/2022    AST 13 12/09/2022    TBIL 0.2 12/09/2022    ALT 13 12/09/2022    GFR >100 12/12/2022     Last Complete Blood Count (CBC) with Differential Result(s)  Lab Results   Component Value Date    WBC 6.4 12/12/2022    RBC 3.88 (L) 12/12/2022    HGB 12.2 (L) 12/12/2022    HCT 35.7 (L) 12/12/2022    MCV 91.9 12/12/2022    MCH 31.4 12/12/2022    MCHC 34.2 12/12/2022    RDW 12.9 12/12/2022    PLT  12/12/2022      Comment:      See Platelet Estimate        MPV 8.8 12/11/2022     Lab Results   Component Value Date    ANCAUTOPCNT 57.8 12/09/2022    ADLYMPCENT 30.4 12/09/2022    MONOPCNTAUT 9.0 12/09/2022    AUTOEOS 2.4 12/09/2022    ADBASOPCENT 0.4 12/09/2022    ANCAUTO 3.7 12/09/2022    ADLYMABS 2.0 12/09/2022    MONOABSAUT 0.6 12/09/2022    AUTOEOSABS 0.2 12/09/2022    ADBASOABS 0.0 12/09/2022     Last Viral Hepatitis B Testing Result(s)  Lab Results   Component Value Date    HBSABQL Nonreactive 01/19/2021    HBSAG Nonreactive 01/19/2021    HEPBCOREABTO Nonreactive 01/19/2021     Last Immunoglobulin Testing Result(s)  IMMUNOGLOBULIN G   Date Value Ref Range Status   09/22/2022 1,268 700  - 1,600 mg/dL Final     IMMUNOGLOBULIN M   Date Value Ref Range Status   09/22/2022 54 40 - 230 mg/dL Final     IMMUNOGLOBULIN A   Date Value Ref Range Status   09/22/2022 300 70 - 400 mg/dL Final      Renewal can be authorized at this time.    Refill authorized on 08/10/2023 per Specialty Pharmacy CPA     Further renewals to be provided, as appropriate.     Johny Sax, PharmD, BCACP  Clinical Pharmacist, Neurology Specialty Pharmacy  [phone] option 9 then 4

## 2023-08-11 ENCOUNTER — Other Ambulatory Visit: Payer: Self-pay

## 2023-08-14 ENCOUNTER — Ambulatory Visit: Payer: Medicare Other | Attending: Neurology | Admitting: Neurology

## 2023-08-14 ENCOUNTER — Encounter: Payer: Self-pay | Admitting: Neurology

## 2023-08-14 ENCOUNTER — Other Ambulatory Visit: Payer: Self-pay

## 2023-08-14 VITALS — BP 89/58 | HR 57 | Temp 96.9°F | Resp 14

## 2023-08-14 DIAGNOSIS — G35 Multiple sclerosis: Secondary | ICD-10-CM | POA: Insufficient documentation

## 2023-08-14 DIAGNOSIS — G8389 Other specified paralytic syndromes: Secondary | ICD-10-CM

## 2023-08-14 DIAGNOSIS — R252 Cramp and spasm: Secondary | ICD-10-CM | POA: Insufficient documentation

## 2023-08-14 HISTORY — PX: ONABOTULINUMTOXINA (BOTOX) INJ CLINIC: AMBINJ0168

## 2023-08-14 NOTE — Nursing Note (Signed)
Marland Kitchen  Vital signs taken, screened for pain, pharmacy verified, allergies reviewed, tobacco questions asked.  Tawnya Crook MA II

## 2023-08-14 NOTE — Procedures (Signed)
PATIENT:  Roy Henry  MRN:         [id number]  DATE:        08/14/2023      Hall Busing III,  is a 53yr-old right-handed male with a past medical history of multiple sclerosis who presents for follow up regarding spastic triplegia.     Patient reports that he had good relief of spasticity and pain with injections, but notes that there has been some wearing off since his last injection. Patient notes that his symptoms starting to improve a couple days after the injection and starting wearing off 1.5 months prior to arrival but still retains some effect in terms of spasticity.    Patient reports taking baclofen 10 mg three times per day, diazepam 10mg  3 times per day, tizanidine 2mg  3 times a day, gabapentin 300mg  3 times a day. Also reports use of extended release morphine 15mg  BID and Norco 10-325 for pain relief.     Botulinum toxin type A - Xeomin (100 units in 2cc normal saline dilution x6) was injected under EMG guidance     Vitals:  Temp: 36.1 C (96.9 F) (01/27 0824)  Temp src: Temporal (01/27 0824)  Pulse: 57 (01/27 0824)  BP: 89/58 (01/27 7846)  Resp: 14 (01/27 0824)  SpO2: --  Height: --  Weight: --    MUSCLE                                        UNITS                EMG ACTIVITY  R gastrocnemius:                           25 units                    +  R anterior tibialis                            25 units                    +  R posterior tibialis                          25 units                    +  R bicep femorus                             75 units                    +  R semitendinosis                           75 units                    +  R adductor magnus                       75 units                    +  L gastrocnemius:                           25 units                    +  L anterior tibialis                             25 units                    +  L posterior tibialis                           25 units                    +  L bicep femorus                             75  units                    +  L semitendinosis                            75 units                    +  L adductor magnus                        75 units                    +      Total dose injected (in units)                    600  Wastage (in units)                                     0      Sincerely,  Werner Lean, MD  PGY-3 Neurology    Report electronically signed by:  Werner Lean, MD

## 2023-08-14 NOTE — Patient Instructions (Signed)
1) no harsh physical treatments to the area of injection for 72 hours  2) no chemical applications to the area of injection for 48 hours  3) you may have mild pain and discomfort to the area of injection for up to 72 hours, and can use tylenol 325-500mg  upto four times a day if needed  4) return to clinic in 3 months for next injection appointment with 600 units of Botulinum toxin type A - Xeomin  with EMG    Expectations from botulinum toxin injections were explained to Roy Henry and was told that he may experience immediate benefit as a result of local injection site response, however true effect of the toxin is not expected for 2-4 weeks. The patient was instructed to monitor response carefully over the next few months.

## 2023-08-14 NOTE — Progress Notes (Deleted)
PATIENT:  Roy Henry  MRN:         [id number]  DATE:        08/14/2023    Dear Colleagues,     Our mutual patient, Roy Henry, was seen today at the [facility] in follow-up for spastic triplegia.  A [place] interpreter was not used during this encounter.    As you know, this is a 53yr-old right-handed male with a past medical history of multiple sclerosis who presents for follow up regarding spastic triplegia.     Patient reports that he had good relief of spasticity and pain with injections, but notes that there has been some wearing off since his last injection. Patient notes that his symptoms starting to improve a couple days after the injection and starting wearing off 1.5 months prior to arrival but still retains some effect in terms of spasticity.    Patient reports taking baclofen 10 mg three times per day, diazepam 10mg  3 times per day, tizanidine 2mg  3 times a day, gabapentin 300mg  3 times a day. Also reports use of extended release morphine 15mg  BID and Norco 10-325 for pain relief.     MEDICATIONS:  Medications Ordered Prior to Encounter[1]    EXAMINATION:    Vitals:  Temp: 36.1 C (96.9 F) (01/27 0824)  Temp src: Temporal (01/27 0824)  Pulse: 57 (01/27 0824)  BP: 89/58 (01/27 0833)  Resp: 14 (01/27 0824)  SpO2: --  Height: --  Weight: --    EXAM  General:  Well appearing, in no acute distress  HEENT: NC/AT, anicteric sclera, mucosa pink/moist  Lungs: Breathing comfortably on room air  Heart: No cyanosis or pallor  Abd: Non-distended  Ext: No gross deformities  Skin: No rashes visualized    Neuro Exam:  Mental Status: Awake, alert, oriented***  Use of language:  Fluent rate, no slur***, no paraphasias***    Cranial Nerves:   CN 1:        Smell testing deferred.   CN 2:        PERRL***.  Visual fields full to fingercount***.  Fundi with sharp disc margins***.   CN 3,4,6:  EOMI***.   CN 5:        Sensation to temperature intact***.  Masseter strength full***.   CN 7:        Face  symmetric***.  Eye closure strength full***.   CN 8:        Hearing intact to conversation***.   CN 9, 10:  Palate elevation symmetric***.   CN 11:      Trapezius strength full***.   CN 12:      Tongue protrusion midline***.    Motor:    Tone and bulk:  Unremarkable bulk and tone***      Adventitious movements:  No resting tremor***      Pronator drift:  None***     UPPER EXTREMITY STRENGTH TESTING       Deltoid Biceps Triceps Wrist Extensors Finger Extensors Finger Abductors Grip   R *** *** *** *** *** *** ***   L *** *** *** *** *** *** ***        Hip Flexors Hip Extensors  Knee Flexors Knee Ext Dorsi- Flexion Plantar- Flexion   R *** *** *** *** *** ***   L *** *** *** *** *** ***     Reflexes:    Reflex Roots Nerve Right Left   bicep C5,C6 musculo- cutaneous 2***+  2***+   brachio- radialis C5,C6 radial 2***+ 2***+   tricep C6,C7,C8 radial 2***+ 2***+   patellar L2,L3,L4 femoral 2***+ 2***+   Achilles S1,S2 tibial 2***+ 2***+   plantar response   down*** down***     Sensory:     Light touch:  Intact forearms and lower legs***       Vibration:  Intact 1st fingers and 1st toes symmetric***     Temperature:  Intact forearms and lower legs symmetric***    Coordination:     F-to-N:  No ataxia***       H-to-S:  No ataxia***      Rapid-alt movements:  No bradykinesia fingertapping***      DIAGNOSTIC STUDIES:  ***    SUMMARY & IMPRESSION:  This is a 53yr-old ***-handed male with a past medical history of *** who has been followed in the Neurology Clinic for ***.    PLAN / RECOMMENDATIONS:   ***    Return to clinic in  NUMBERS 1-12:10  TIME [id number].    Patient was seen and discussed with attending Dr. Marland Kitchen who agrees with the above stated findings and plan of care.     Sincerely,  Werner Lean, MD  PGY-3 Neurology    Report electronically signed by:  Werner Lean, MD       [1]   Current Outpatient Medications on File Prior to Visit   Medication Sig Dispense Refill    Acetaminophen (TYLENOL) 500 mg Tablet  Take 2 tablets by mouth every 6 hours. 100 tablet 0    Apixaban (ELIQUIS) 5 mg Tablet Take 1 tablet by mouth 2 times daily.      Baclofen (LIORESAL) 10 mg Tablet Take 1 tablet by mouth 3 times daily.      Bisacodyl (DULCOLAX) 10 mg Suppository Insert 1 suppository into the rectum every other day.      Cholecalciferol (VITAMIN D-3) 50 mcg (2,000 unit) Tablet Take 1 tablet by mouth every day.      Dalfampridine (AMPYRA) 10 mg ER Tablet Take 1 tablet by mouth 2 times daily. 180 tablet 3    Diazepam (VALIUM) 5 mg Tablet Take 2 tablets by mouth every 6 hours if needed (spasticity).      Docusate (COLACE) 100 mg Capsule Take 1 capsule by mouth 2 times daily.      Gabapentin (NEURONTIN) 300 mg Capsule Take 1 capsule by mouth 3 times daily. 90 capsule 3    Hydrocodone-Acetaminophen (NORCO) 10-325 mg Tablet Take 1 tablet by mouth every 6 hours if needed for pain.      Lactulose (CHRONULAC) 10 gram/15 mL Solution Take 30 mL by mouth 2 times daily.      Lidocaine 4 % Patch Apply 1 patch to the affected area every day.      Lisinopril (PRINIVIL, ZESTRIL) 5 mg Tablet Take 2 tablets by mouth every day.      Magnesium Hydroxide (MILK OF MAGNESIA) 400 mg/5 mL Liquid Take 30 mL by mouth once daily if needed. 900 mL 0    menthol (ICY HOT, MENTHOL,) 16 % Aerosol Spray Apply to the affected area if needed.      Morphine (MS CONTIN) 15 mg SR 12hr Tablet Take 1 tablet by mouth every 12 hours.      Naloxone (NARCAN) 4 mg/actuation Nasal Spray Use for opioid overdose. Give 1 spray in nostril. If no response / breathing slows down, repeat every 2 minutes until emergency help arrives. 2 each 0    ofatumumab (  KESIMPTA PEN) 20 mg/0.4 mL Pen Injector Inject 1 pen subcutaneously every 4 weeks. 1.2 mL 3    onabotulinumtoxinA (BOTOX INJ) 0 Refill(s), Maintenance      Polyethylene Glycol 3350 (MIRALAX) 17 gram Powder Take 1 packet by mouth every morning. Mix in 4 to 8 oz of water, soda, coffee, juice or tea. 30 packet 0    Sennosides (SENNA) 8.6 mg  Tablet Take 2 tablets by mouth every day at bedtime.      Sodium Phosphates (FLEET ENEMA) 19-7 gram/118 mL Enema Insert 1 enema into the rectum every third day. (Every 72 hours).      TiZANidine (ZANAFLEX) 2 mg Tablet Take 1 tablet by mouth 3 times daily.       No current facility-administered medications on file prior to visit.

## 2023-08-21 ENCOUNTER — Other Ambulatory Visit: Payer: Self-pay

## 2023-08-21 NOTE — Progress Notes (Signed)
Specialty Pharmacy Telephone Encounter: Medication Fill Coordination     Roy Henry  [id number]    Called patient to coordinate medication(s) to be filled at [facility]:    Kesimpta  Pen 20 mg / 0.4 ml # 0.4 ml    Telephone follow-up will be scheduled prior to the next refill due date, but patient is encouraged to contact pharmacy sooner if any issues arise.     Schedule Ship/Pick Up Date:  2/4  Contact Person:   Patient 's nurse Bryan   Number of Supplies Left in Days: 30    Credit Card on File:   N/A- $0 Copay      Initials &  Program Carrier Method Signature Option    [facility]   [employer] Overnight  No Signature Required        [name]  Specialty Technician  Specialty Pharmacy   P: [phone] Option 9 then  #4  08/21/23  11:35 AM

## 2023-08-21 NOTE — Progress Notes (Signed)
Labs completed  Judie Grieve to  fax  back 2/3    [name]  Specialty Technician  Specialty Pharmacy   P: [phone] Option 9 then  #4  08/21/23  11:33 AM

## 2023-08-21 NOTE — Progress Notes (Signed)
Reason for Telephone: Medication Adherence Follow-up Kesimpta      Reviewed Home Medication List: Yes, no new changes in medications were noted.    Med Adherence Screen (Number doses missed/Number tablets remaining): Patient notes that they have not missed any doses. Next dose  due 2/3 -  pt has  1 pen on hand     Adverse Drug Events/Toxicities: None    Telephone follow-up in 25 days, but patient is encouraged to contact pharmacist sooner if any issue arises    Additional Notes: Medication to be delivered on 2/5      [name]  Specialty Technician  Specialty Pharmacy   P: [phone] Option 9 then  #4  08/21/23  11:33 AM

## 2023-08-22 ENCOUNTER — Encounter: Payer: Self-pay | Admitting: Neurology

## 2023-08-22 NOTE — Progress Notes (Signed)
DOS: 08/14/2023    BOTULINUM TOXIN INJECTION    Roy Henry is a 53yr-old previously right-handed (now with R arm weakness) man who presents for follow up for spasticity due to multiple sclerosis.     With initiation of injections he has had benefit of allowing him to sit up in a wheelchair and bed more and had more flexibility of his lower extremities for care which has continued to improve. He does not note any wearing off.     Exam: Blood pressure (!) 89/58, pulse 57, temperature 36.1 C (96.9 F), temperature source Temporal, resp. rate 14.    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    The area was cleaned with alcohol and EMG ground leads were placed on antagonist muscle groups.     Botulinum toxin type A - Xeomin (100 units in 2cc normal saline dilution x6) was injected under EMG guidance as follows:    MUSCLE                                        UNITS                EMG ACTIVITY  R gastrocnemius:    25 units  +  R anterior tibialis    25 units  +  R posterior tibialis   25 units  +  R bicep femorus   75 units  +  R semitendinosis    75 units   +  R adductor magnus   75 units  +    L gastrocnemius:    25 units   +  L anterior tibialis    25 units  +  L posterior tibialis   25 units  +  L bicep femorus   75 units  +  L semitendinosis    75 units   +  L adductor magnus    75 units   +      Total dose injected (in units)  600  Wastage (in units)    0    The patient tolerated the procedure well with no complications.    Expectations from botulinum toxin injections were explained to [name] and was told that he may experience immediate benefit as a result of local injection site response, however true effect of the toxin is not expected  for 2-4 weeks.  The patient was instructed to monitor response carefully over the next few months.    Ann Held, MD

## 2023-08-24 ENCOUNTER — Other Ambulatory Visit: Payer: Self-pay

## 2023-08-24 NOTE — Progress Notes (Addendum)
Specialty Pharmacy - Neurology Note:    Per review of labs from [facility], only BMP and CBC completed on 08/21/23. Patient is still due for Kesimpta monitoring labs (CMP (hepatic function), CBC (completed), IgA, IgG, IgM, Hep B Core Ab, Surface Ab, Surface Ag) given last completed 12/09/22, 09/22/22, and 01/19/21, respectively.     Follow-up: Patient to complete remaining Kesimpta monitoring labs at upcoming 09/27/23 visit with Debby Bud, NP for convenience.     Johny Sax, PharmD, BCACP  Clinical Pharmacist, Neurology Specialty Pharmacy  [phone] option 9 then 4

## 2023-08-24 NOTE — Progress Notes (Addendum)
Called care home [phone] & spoke with Pattricia Boss who verified only two labs completed on 08/21/23 (CBC & BMP), confirmed fax # [phone] to refax results. Pattricia Boss states no other labs completed and no current standing orders.    [facility]  Specialty Technician  Specialty Pharmacy  P: [phone], Option # 9, #9  08/24/23 9:38 AM

## 2023-08-28 MED FILL — incobotulinumtoxinA 100 unit intramuscular solution: INTRAMUSCULAR | Qty: 600 | Status: AC

## 2023-09-15 NOTE — Telephone Encounter (Signed)
 Patient is scheduled 09/27/2023.  Roy Henry  [facility]   Neurology clinic

## 2023-09-27 ENCOUNTER — Ambulatory Visit: Payer: Medicare Other | Attending: Family | Admitting: Family

## 2023-09-27 VITALS — BP 97/65 | HR 61

## 2023-09-27 DIAGNOSIS — G35 Multiple sclerosis: Secondary | ICD-10-CM | POA: Insufficient documentation

## 2023-09-27 DIAGNOSIS — G8389 Other specified paralytic syndromes: Secondary | ICD-10-CM | POA: Insufficient documentation

## 2023-09-27 NOTE — Nursing Note (Signed)
 Patient vitals checked, allergies verified, pharmacy verified, and patient screened for pain.  By Berenice Bouton.

## 2023-09-27 NOTE — Patient Instructions (Signed)
 Thank you for visiting the Clinical Neurosciences Department at the [facility] today.    The neurologist(s) you saw today was(were):   Nurse Practitioner: Debby Bud NP     Recommendations made during today's visit include:   Call us for any new or worsening symptoms.   Follow-up in 6 months with Dr. Rutherford Limerick.    The following diagnostic tests have been ordered to help [place] better determine the underlying cause of your condition as well as guide best treatment. :    Blood tests orders sent with you to have done at the facility.    The following changes to your medication regimen have been made:    None    The following referrals have been generated through the [facility] system:   Physical therapy      An open line of communication between you, your family, and [place] (your neurology team) is important to [place], both for safety and maintaining the highest quality of patient care.  --------------------------------------------------------------------------------------------------------    If you have any questions, please feel free to ask [place]!    1. By telephone.  You can reach your resident neurologist by calling the [facility] neurology clinic at [phone].    2. Online. We also encourage you to register for [employer]," which allows you online computer access to your medical chart, as well as an opportunity to e-mail your doctor regarding non-urgent issues.  You can sign up by visiting the following link: "[email or web address]".  3. You will need to be seen at least annually for [place] to be able to continue refilling prescriptions to ensure continued safety and efficacy of medications.    Thank you for choosing the Neurology department at the [facility].  It was a pleasure to participate in your care today    Sincerely,     Debby Bud, NP-BC
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
## 2023-10-16 ENCOUNTER — Telehealth: Payer: Self-pay | Admitting: Family

## 2023-10-16 NOTE — Telephone Encounter (Signed)
 Lab results received and uploaded into EMR.  Message forwarded to provider for review.    Maryjo Rochester, RN  Neurology

## 2023-10-16 NOTE — Telephone Encounter (Signed)
 Patient identifiers confirmed with patient: Name, DOB, Phone number, address.     Elenora with [facility] called stating she would be faxing [place] lab results from labs that were requested on 09/27/2023    Eleonara#[phone]    Laureen Abrahams II  [facility] Neurology  Appointment line [phone]  Clinic Fax # [phone]

## 2023-10-25 ENCOUNTER — Other Ambulatory Visit: Payer: Self-pay

## 2023-10-25 NOTE — Progress Notes (Signed)
 Specialty Pharmacy Telephone Encounter: Medication Fill Coordination     BECKHEM ISADORE  [id number]    Called patient to coordinate medication(s) to be filled at [facility]:    Dalfampridine 10 mg Tab # 60    Telephone follow-up will be scheduled prior to the next refill due date, but patient is encouraged to contact pharmacy sooner if any issues arise.     Schedule Ship/Pick Up Date:  4/10  Contact Person:   Patient 's nurse Saypoon  Number of Supplies Left in Days: 0    Credit Card on File:   N/A- $0 Copay        Initials &  Program Carrier Method Signature Option    [facility]   [employer]Ex Overnight  No Signature Required        [name]  Specialty Technician  Specialty Pharmacy   P: [phone] Option 9 then  #4  10/25/23  3:12 PM

## 2023-10-25 NOTE — Progress Notes (Signed)
 Reason for Telephone: Medication Adherence Follow-up Dalfampridine     Reviewed Home Medication List: Yes, no new changes in medications were noted.    Med Adherence Screen (Number doses missed/Number tablets remaining): Patient notes that they have not missed any doses. Pt is out of medication as of today     Adverse Drug Events/Toxicities: None    Telephone follow-up in 25 days, but patient is encouraged to contact pharmacist sooner if any issue arises    Additional Notes: Medication to be delivered on 4/11    Nurse Saypoon called.  Pt was  given last dose today, is out of medication.   Sent 30 day refill.  Will schedule 3 months on 5/7 with Kesimpta       [name]  Specialty Technician  Specialty Pharmacy   P: [phone] Option 9 then  #4  10/25/23  3:10 PM

## 2023-11-02 ENCOUNTER — Other Ambulatory Visit: Payer: Self-pay

## 2023-11-02 ENCOUNTER — Telehealth: Payer: Self-pay

## 2023-11-02 NOTE — Telephone Encounter (Signed)
 Called SNF facility to remind of patients labs. Patients nurse brian said he will get those done.      Roy Henry Roy Henry  [facility], Specialty Pharmacy  [phone] Direct Line

## 2023-11-03 ENCOUNTER — Other Ambulatory Visit: Payer: Self-pay

## 2023-11-03 NOTE — Progress Notes (Signed)
 Received a  call from  pharmacy that  fills for [name] , asking about last fill of dalfampridine .   Update provided  refill  request from other pharmacy cancelled    Next received a  refill request via mychart rompt  requesting a refill on  Kesimpta .  Also too soon.    Called  and  spoke with  Nurse Sherline Distel.  Verified pt does have the 1 month supply we sent 4/10.  Also, pt just  received hs last kesimpta  dose on 4/16.  Does not  have an extra pen  on  hand  for next  dose.       Earliest next fill is 5/1,  will schedule delivery on that  date.  Informed Sherline Distel  who  will  update pt.    Refill task  updated     Roy Henry  Specialty Technician  Specialty Pharmacy   P: [phone] Option 9 then  #4  11/03/23  11:23 AM

## 2023-11-09 ENCOUNTER — Other Ambulatory Visit: Payer: Self-pay

## 2023-11-10 ENCOUNTER — Encounter: Payer: Self-pay | Admitting: Family

## 2023-11-10 NOTE — Telephone Encounter (Signed)
 Specialty Pharmacy - Neurology Note:    Addressed in Specialty Pharmacy on 10/25/2023.     Deanne Eye, PharmD, BCACP  Clinical Pharmacist, Neurology Specialty Pharmacy  [phone] option 9 then 4
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
## 2023-11-13 ENCOUNTER — Other Ambulatory Visit: Payer: Self-pay

## 2023-11-13 NOTE — Progress Notes (Signed)
 Specialty Pharmacy - Neurology Note:    [facility] SNF and spoke with Thane Finch, RN regarding Kesimpta  injection reaction. Re-counseled on Kesimpta  injection technique and recommended injecting into the abdomen at least 2 inches from the bellybutton, avoiding his tattoos, stretch marks, moles, scars, or areas where the skin is tender, bruised, red, scaly, or hard, given that the reaction that he had after the last Kesimpta  injection in the left arm is not typical and should not occur. Also instructed them to let us  know immediately if he has any reaction to future Kesimpta  injections. Thane Finch stated that he will document in patient's record and notify [facility] when she comes back. All questions addressed.    Deanne Eye, PharmD, BCACP  Clinical Pharmacist, Neurology Specialty Pharmacy  [phone] option 9 then 4

## 2023-11-16 ENCOUNTER — Other Ambulatory Visit: Payer: Self-pay

## 2023-11-16 NOTE — Progress Notes (Addendum)
 Reason for Telephone: Medication Adherence Follow-up Kesimpta  and Dalfampridine     Reviewed Home Medication List: Yes, no new changes in medications were noted.    Med Adherence Screen (Number doses missed/Number tablets remaining): Patient notes that they have not missed any doses. Next kesimpta  dose due 5/16  and had  5 days  of dalfampridine  on  hand     Adverse Drug Events/Toxicities: None    Telephone follow-up in 25 days, but patient is encouraged to contact pharmacist sooner if any issue arises    Additional Notes: Medication to be delivered on 5/2    Asked nurse Sayfon if pt has  completed labs sent 09/2023.  Per Sayfon labs completed and will fax results to [phone]      Roy Henry  Specialty Technician  Specialty Pharmacy   P: [phone] Option 9 then  #4  11/16/23  9:58 AM

## 2023-11-16 NOTE — Progress Notes (Signed)
 Specialty Pharmacy Telephone Encounter: Medication Fill Coordination     Roy Henry  [id number]    Called patient to coordinate medication(s) to be filled at [facility]:    Dalfampridine  10 mg Tab # 180  Kesimpta  Pen 20 mg/0.4 ml # 1.2 ml    Telephone follow-up will be scheduled prior to the next refill due date, but patient is encouraged to contact pharmacy sooner if any issues arise.     Schedule Ship/Pick Up Date:  5/1  Contact Person:   Patient 's nurse Roy Henry  Number of Supplies Left in Days: 5    Credit Card on File:   N/A- $0 Copay      Initials &  Program Carrier Method Signature Option    [facility]   [employer] Overnight  No Signature Required        Roy Henry  Specialty Technician  Specialty Pharmacy   P: [phone] Option 9 then  #4  11/16/23  10:02 AM

## 2023-11-17 ENCOUNTER — Other Ambulatory Visit: Payer: Self-pay

## 2023-11-17 NOTE — Progress Notes (Signed)
 Called & s/w Nurse Saysoon, who stated there is a test result for CBC w/diff from 09/18/23 and will fax over to [phone].    [facility]  Specialty Technician  Specialty Pharmacy  P: [phone], Option # 9, #9  11/17/23 9:59 AM

## 2023-11-20 NOTE — Progress Notes (Addendum)
 Clinical Pharmacist Note:    Subjective/Objective:  Patient on Kesimpta  (ofatumumab ) 20mg  SubQ every 4 weeks for the treatment of Multiple Sclerosis.     Pertinent Monitoring: Labs completed at [facility] [phone]).  Last Chemistry Panel Result(s)    Last Complete Blood Count (CBC) with Differential Result(s)      Last Viral Hepatitis B Testing Result(s)      Last Immunoglobulin Testing Result(s)      Next Appointment:  Due ~03/2024 with Dr. Herman Longs.     Assessment/Plan:   Labs reviewed. Stable. Patient to continue taking Kesimpta  (ofatumumab ) as prescribed.  Next labs due ~03/2024.    Deanne Eye, PharmD, BCACP  Clinical Pharmacist, Neurology Specialty Pharmacy  [phone] option 9 then 4

## 2023-11-30 ENCOUNTER — Telehealth: Payer: Self-pay | Admitting: Social Worker

## 2023-11-30 NOTE — Telephone Encounter (Signed)
 Patient is scheduled for 12/05/2023 11am.     Thank you     .Clementine Cutting  [facility]   Neurology clinic

## 2023-11-30 NOTE — Telephone Encounter (Signed)
 LCSW contacted [facility] [phone]) and spoke with the RN supervisor to coordinate gurney transport for his 12/05/23 at 11:00 AM with NP Butters. RN supervisor stated she put in the order and give the information to social services to arrange the transportation.

## 2023-11-30 NOTE — Telephone Encounter (Signed)
 Please send me a message once the appointment is scheduled.

## 2023-12-05 ENCOUNTER — Ambulatory Visit: Admitting: Family

## 2023-12-08 ENCOUNTER — Ambulatory Visit: Admitting: Family

## 2023-12-08 ENCOUNTER — Ambulatory Visit: Attending: Neurology | Admitting: Neurology

## 2023-12-08 VITALS — BP 101/66 | HR 62 | Temp 97.1°F | Resp 16 | Ht 73.0 in | Wt 205.0 lb

## 2023-12-08 DIAGNOSIS — M2450 Contracture, unspecified joint: Secondary | ICD-10-CM | POA: Insufficient documentation

## 2023-12-08 DIAGNOSIS — G8389 Other specified paralytic syndromes: Secondary | ICD-10-CM

## 2023-12-08 DIAGNOSIS — G35 Multiple sclerosis: Secondary | ICD-10-CM | POA: Insufficient documentation

## 2023-12-08 DIAGNOSIS — R252 Cramp and spasm: Secondary | ICD-10-CM | POA: Insufficient documentation

## 2023-12-08 NOTE — Nursing Note (Signed)
 The pt was identified by using two patient identifiers: Name & DOB. Vital signs measured, drug allergies reviewed, and preferred pharmacy selected. Angelita Kendall MA II    Vitals:    12/08/23 1525   BP: 101/66   SITE: left arm   Orthostatic Position: lying   Cuff Size: regular   Pulse: 62   Resp: 16   Temp: 36.2 C (97.1 F)   TempSrc: Temporal   Weight: 93 kg (205 lb)   Height: 1.854 m (6\' 1" )
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
## 2024-01-31 ENCOUNTER — Other Ambulatory Visit: Payer: Self-pay

## 2024-02-05 ENCOUNTER — Other Ambulatory Visit: Payer: Self-pay

## 2024-02-05 ENCOUNTER — Other Ambulatory Visit: Payer: Self-pay | Admitting: Neurology

## 2024-02-05 DIAGNOSIS — G35 Multiple sclerosis: Secondary | ICD-10-CM

## 2024-02-05 MED ORDER — DALFAMPRIDINE ER 10 MG TABLET,EXTENDED RELEASE,12 HR
10.0000 mg | EXTENDED_RELEASE_CAPSULE | Freq: Two times a day (BID) | ORAL | 3 refills | Status: AC
  Filled 2024-02-05: qty 180, 90d supply, fill #0
  Filled 2024-04-26: qty 180, 90d supply, fill #1
  Filled 2024-07-30: qty 180, 90d supply, fill #2

## 2024-02-05 NOTE — Telephone Encounter (Signed)
 Specialty Pharmacy Prescription Renewal Encounter:    Received prescription renewal request for Ampyra  (dalfampridine ) 10 mg ER tablet: take 1 tablet by mouth twice daily. 12 hours between doses (#180, 3RF)    Last fill date: 11/16/23 (#180/90ds) from [facility]  Last clinic visit: 12/08/23 Everlina Lab, MD)  - Continue Ampyra  10 mg bid for weakness in the arms and legs   Next upcoming clinic visit: 05/31/24 Everlina Lab, MD)     Pertinent monitoring: Labs completed at [facility] [phone]). Estimated CrCL ~184.78mL/min.     Renewal can be authorized at this time.    Refill authorized on 02/05/2024 per Specialty Pharmacy CPA     Further renewals to be provided, as appropriate.     Leeroy Garnet, PharmD, BCACP  Clinical Pharmacist, Neurology Specialty Pharmacy  [phone] option 9 then 4

## 2024-02-06 ENCOUNTER — Other Ambulatory Visit: Payer: Self-pay

## 2024-02-06 NOTE — Progress Notes (Signed)
 Reason for Telephone: Medication Adherence Follow-up Kesimpta  and Dalfampridine      Reviewed Home Medication List: , no new changes in medications were noted.     Med Adherence Screen (Number doses missed/Number tablets remaining): Patient notes that they have not missed any doses. Next kesimpta  dose due 8/6 - does not have a pen on hand  and  has about 60 days of Dalfampridine  on hand    Adverse Drug Events/Toxicities: None       Telephone follow-up in 28 days, but patient is encouraged to contact pharmacist sooner if any issue arises    Additional Notes: Medication to be delivered on 7/24        [facility]  Specialty Technician  Specialty Pharmacy   P: [phone] Option 9 then  #4  02/06/24  9:39 AM

## 2024-02-06 NOTE — Progress Notes (Signed)
 Specialty Pharmacy Telephone Encounter: Medication Fill Coordination     Roy Henry  [id number]    Called patient to coordinate medication(s) to be filled at [facility]:    Dalfampridine  10 mg Tab # 180  Kesimpta  Pen 20 mg/ 0.4 ml # 1.2 ml     Telephone follow-up will be scheduled prior to the next refill due date, but patient is encouraged to contact pharmacy sooner if any issues arise.     Schedule Ship/Pick Up Date:  7/23  Contact Person:   Patient 's nurse jasmine  Number of Supplies Left in Days: 15    Credit Card on File:   N/A- $0 Copay        Initials &  Program Carrier Method Signature Option    [facility] Neuro   FedEx Overnight  No Signature Required        Glendale Cruel  Specialty Technician  Specialty Pharmacy   P: [phone] Option 9 then  #4  02/06/24  9:41 AM

## 2024-02-07 ENCOUNTER — Other Ambulatory Visit: Payer: Self-pay | Admitting: Family

## 2024-02-07 ENCOUNTER — Encounter: Payer: Self-pay | Admitting: Family

## 2024-02-07 DIAGNOSIS — S40822D Blister (nonthermal) of left upper arm, subsequent encounter: Secondary | ICD-10-CM

## 2024-03-04 ENCOUNTER — Ambulatory Visit: Payer: Self-pay | Admitting: Neurology

## 2024-03-04 DIAGNOSIS — Z5329 Procedure and treatment not carried out because of patient's decision for other reasons: Secondary | ICD-10-CM

## 2024-03-25 ENCOUNTER — Other Ambulatory Visit: Payer: Self-pay

## 2024-03-25 DIAGNOSIS — Z5181 Encounter for therapeutic drug level monitoring: Secondary | ICD-10-CM

## 2024-03-25 DIAGNOSIS — G35 Multiple sclerosis: Secondary | ICD-10-CM

## 2024-03-25 NOTE — Progress Notes (Signed)
 Pt due for Q24mo Kesimpta  lab monitoring. Lab orders placed, will notify pt to complete.     Delon Car) Germaine, PharmD, CSP, MPH  Sr. Clinical Specialty Pharmacist   PHN [phone] #9, then #4

## 2024-03-27 ENCOUNTER — Encounter: Payer: Self-pay | Admitting: Neurology

## 2024-03-27 ENCOUNTER — Ambulatory Visit: Attending: Neurology | Admitting: Neurology

## 2024-03-27 VITALS — BP 91/63 | HR 81 | Temp 97.7°F | Ht 73.0 in

## 2024-03-27 DIAGNOSIS — R252 Cramp and spasm: Secondary | ICD-10-CM | POA: Insufficient documentation

## 2024-03-27 NOTE — Progress Notes (Signed)
 DOS: 03/27/2024    BOTULINUM TOXIN INJECTION    Roy Henry is a 53yr-old previously right-handed (now with R arm weakness) man who presents for follow up for spasticity due to multiple sclerosis.     With initiation of injections he has had benefit of allowing him to sit up in a wheelchair and bed more and had more flexibility of his lower extremities for care which has continued to improve. He does not note any wearing off.     Exam: Blood pressure 91/63, pulse 81, temperature 36.5 C (97.7 F), temperature source Skin, height 1.854 m (6' 1).    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    The area was cleaned with alcohol and EMG ground leads were placed on antagonist muscle groups.     Botulinum toxin type A  - Xeomin  (100 units in 2cc normal saline dilution x6) was injected under EMG guidance as follows:    MUSCLE                                        UNITS                EMG ACTIVITY  R gastrocnemius:    25 units  +  R anterior tibialis    25 units  +  R posterior tibialis   25 units  +  R bicep femorus   75 units  +  R semitendinosis    75 units   +  R adductor magnus   75 units  +    L gastrocnemius:    25 units   +  L anterior tibialis    25 units  +  L posterior tibialis   25 units  +  L bicep femorus   75 units  +  L semitendinosis    75 units   +  L adductor magnus    75 units   +      Total dose injected (in units)  600  Wastage (in units)    0    The patient tolerated the procedure well with no complications.    Expectations from botulinum toxin injections were explained to Roy and was told that he may experience immediate benefit as a result of local injection site response, however true effect of the toxin is not expected  for 2-4 weeks.  The patient was instructed to monitor response carefully over the next few months.    Melia Mana, MD

## 2024-03-27 NOTE — Patient Instructions (Signed)
1) no harsh physical treatments to the area of injection for 72 hours  2) no chemical applications to the area of injection for 48 hours  3) you may have mild pain and discomfort to the area of injection for up to 72 hours, and can use tylenol 325-500mg  upto four times a day if needed  4) return to clinic in 3 months for next injection appointment with 600 units of xeomin with  EMG    Expectations from botulinum toxin injections were explained to Roy Henry and was told that he may experience immediate benefit as a result of local injection site response, however true effect of the toxin is not expected for 2-4 weeks. The patient was instructed to monitor response carefully over the next few months.

## 2024-03-27 NOTE — Nursing Note (Signed)
 Identify by two identifiers vitals taken, allergies and pain level verified,tobacco usage, pharmacy updated.   Patient WAS not wearing a surgical mask      [name] Jaci Standard MA

## 2024-04-09 ENCOUNTER — Other Ambulatory Visit: Payer: Self-pay

## 2024-04-12 ENCOUNTER — Other Ambulatory Visit: Payer: Self-pay

## 2024-04-18 MED FILL — incobotulinumtoxinA 100 unit intramuscular solution: INTRAMUSCULAR | Qty: 600 | Status: AC

## 2024-04-19 ENCOUNTER — Other Ambulatory Visit: Payer: Self-pay

## 2024-04-19 NOTE — Progress Notes (Signed)
 Reason for Telephone:  Medication Adherence Follow-up    Reviewed Home Medication List: Yes, no new changes in medications were noted.     Med Adherence Screen (Number doses missed/Number tablets remaining): Patient notes that they have not missed any doses but noted that his last injection his nurse didn't hold the needle against his skin for 20 seconds. He was unsure if he got the full dose.     Adverse Drug Events/Toxicities: None     Telephone follow-up in 84 days, but patient is encouraged to contact pharmacist sooner if any issue arises    Additional Notes: Medication to be delivered on 10/7

## 2024-04-19 NOTE — Progress Notes (Signed)
 Specialty Pharmacy Telephone Encounter: Medication Fill Coordination      WINFORD HEHN  [id number]     Called patient to coordinate medication(s) to be filled at [facility]:     Kesimpta   Pen 20 mg / 0.4 ml # 0.4 ml     Telephone follow-up will be scheduled prior to the next refill due date, but patient is encouraged to contact pharmacy sooner if any issues arise.      Schedule Ship/Pick Up Date:             10/06  Contact Person:                                  Patient  Number of Supplies Left in Days:       14     Credit Card on File:                             N/A- $0 Copay        Initials &  Program Carrier Method Signature Option    [facility] Neuro   FedEx Overnight  No Signature Required

## 2024-04-20 LAB — CBC WITH DIFFERENTIAL
Bands %: 1 %
Eosinophils %: 4 %
Eosinophils Abs: 0.3 K/MM3 (ref 0.1–0.3)
Hematocrit: 38.6 % — ABNORMAL LOW (ref 41.0–53.0)
Hemoglobin: 13.1 g/dL — ABNORMAL LOW (ref 13.5–17.5)
Lymphocytes %: 27 %
Lymphocytes Abs: 2 K/MM3 (ref 1.0–4.8)
MCH: 31.1 pg (ref 27.0–33.0)
MCHC: 34.1 % (ref 32.0–36.0)
MCV: 91.2 fL (ref 80.0–100.0)
MPV: 8.8 fL (ref 6.8–10.0)
Monocytes %: 8 %
Monocytes Abs: 0.6 K/MM3 — ABNORMAL HIGH (ref 0.2–0.4)
Neutrophil Abs: 4.5 K/MM3 (ref 1.8–7.7)
Nucleated RBC/100 WBC: 1 %{WBCs}
Platelet Count: 249 K/MM3 (ref 130–400)
Platelet Estimate, Smear: ADEQUATE
Polys (Segs) %: 60 %
RDW: 14.5 % (ref 0.0–14.7)
Red Blood Cell Count: 4.23 M/MM3 — ABNORMAL LOW (ref 4.50–5.90)
White Blood Cell Count: 7.3 K/MM3 (ref 4.5–11.0)

## 2024-04-20 LAB — BASIC METABOLIC PANEL
Anion Gap: 14 mmol/L (ref 7–15)
Calcium: 8.7 mg/dL (ref 8.6–10.0)
Carbon Dioxide Total: 24 mmol/L (ref 22–29)
Chloride: 101 mmol/L (ref 98–107)
Creatinine Serum: 0.55 mg/dL (ref 0.51–1.17)
E-GFR Creatinine (Male): >100 mL/min/1.73m*2
Glucose: 100 mg/dL (ref 74–109)
Potassium: 4.1 mmol/L (ref 3.4–5.1)
Sodium: 139 mmol/L (ref 136–145)
Urea Nitrogen, Blood (BUN): 4 mg/dL — ABNORMAL LOW (ref 6–20)

## 2024-04-25 ENCOUNTER — Telehealth: Payer: Self-pay | Admitting: Neurology

## 2024-04-25 NOTE — Telephone Encounter (Signed)
 On 04/24/24 Mr. Keeton Kassebaum Lundy) came into the Clinic to drop off a  Dealer for [employer] . I had her fill out a routing form with instructions Per Mr. Saye and attached patients MRN label to the form and placed them in Dr. Tennie mailbox.    Artis Enedina LAWMAN II  Neurology Department  Main Line: [phone]

## 2024-04-26 ENCOUNTER — Other Ambulatory Visit: Payer: Self-pay

## 2024-04-26 DIAGNOSIS — Z5181 Encounter for therapeutic drug level monitoring: Secondary | ICD-10-CM

## 2024-04-26 NOTE — Progress Notes (Signed)
 10/10  1st attempt to reach patient.    Called the carehome nurses line and line was busty and no voicemail available. Called regarding Dalfampridine  to coordinate shipment to their home.    Will try back at a later date, if call is not returned.    [facility]  Clinical Specialty Pharmacy Technician III  Specialty Pharmacy  P: [phone], Option # 9, #4  04/26/2024-2:17 PM  (P Specialty Pharmacy Work Ship broker)

## 2024-04-30 ENCOUNTER — Other Ambulatory Visit: Payer: Self-pay

## 2024-04-30 ENCOUNTER — Encounter: Payer: Self-pay | Admitting: Neurology

## 2024-04-30 NOTE — Progress Notes (Signed)
 Specialty Pharmacy Telephone Encounter: Medication Fill Coordination     ADARRIUS GRAEFF  [id number]    Called patient to coordinate medication(s) to be filled at [facility]:    Outpatient Medications   dalfampridine  (Ampyra ) 10 mg ER tablet    Dose: 10 mg; Route: ORAL; Frequency: TWO TIMES DAILY          Telephone follow-up will be scheduled prior to the next refill due date, but patient is encouraged to contact pharmacy sooner if any issues arise.     Schedule Ship/Pick Up Date:  05/01/2024  Contact Person:   Nurse  Number of Supplies Left in Days: 15        Initials &  Program Carrier Method Signature Option    [facility] NEURO  FedEx Overnight  No Signature Required          [facility]  Clinical Specialty Pharmacy Technician III  Specialty Pharmacy  P: [phone], Option # 9, #4  04/30/2024-2:08 PM  (P Specialty Pharmacy Work Ship broker)

## 2024-04-30 NOTE — Telephone Encounter (Signed)
 Patient identifiers confirmed with patient: Name, DOB, Phone number, address.     Received a call from [facility] at [facility]. He stated that the first page of the Medical Exemption form is missing from the submitted documents.    Please advise    Ph:[phone]  [phone]    Maryetta Jewels  Neurology Clinic [facility]  Appointment Line :[phone]

## 2024-04-30 NOTE — Telephone Encounter (Signed)
 Signed Medical Exemption forms Part II received.  Faxed forms to [employer] [phone].  Fax confirmation received.  Copy to be scanned to EMR.    Telephone call to patient, 3 pt identifiers used.  Let pt know Part II Medical Exemption forms, justification letter, and chart notes have been faxed to [employer], but Part I is still needed.  Pt states he will have Part I faxed to clinic.  Fax number provided.    Farrel Piedmont, RN  Neurology

## 2024-04-30 NOTE — Progress Notes (Signed)
 Reason for Telephone: Medication Adherence Follow-up for Dalfampridine  10mg  ER Tablet     Reviewed Home Medication List: Yes, pt is not taking Lisinopril      Med Adherence Screen (Number doses missed/Number tablets remaining): Patient notes that they have not missed any doses. Pt has approximately 14 days remaining    Adverse Drug Events/Toxicities: no      Telephone follow-up in 83 days, but patient is encouraged to contact pharmacist sooner if any issue arises    Additional Notes: Medication to be delivered on 10/15

## 2024-04-30 NOTE — Telephone Encounter (Signed)
 Received Medical Exemption Form Part I via fax from pt and re-faxed all required documents back to [facility] [phone], fax confirmation received.  Copy scanned into EMR.    Farrel Piedmont, RN  Neurology

## 2024-05-01 NOTE — Telephone Encounter (Signed)
 Addressed in previous message below.    Farrel Piedmont, RN  Neurology

## 2024-05-22 NOTE — Progress Notes (Signed)
 Clinical Pharmacist Note:    Subjective/Objective:  Patient on Kesimpta  (ofatumumab ) 20mg  SubQ once monthly and Ampyra  (dalfampridine ) 10mg  BID for the treatment of Multiple Sclerosis and Ambulatory dysfunction.    Pertinent Monitoring:  Last Chemistry Panel Result(s)  Lab Results   Component Value Date    NA 139 04/19/2024    K 4.1 04/19/2024    CL 101 04/19/2024    CO2 24 04/19/2024    BUN 4 (L) 04/19/2024    CR 0.55 04/19/2024    GLU 100 04/19/2024    CA 8.7 04/19/2024    TP 6.9 12/09/2022    ALB 3.8 (L) 12/09/2022    ALP 70 12/09/2022    AST 13 12/09/2022    TBIL 0.2 12/09/2022    ALT 13 12/09/2022    GFR >100 04/19/2024     Estimated CrCL ~200.5mL/min    Last Complete Blood Count (CBC) with Differential Result(s)  Lab Results   Component Value Date    WBC 7.3 04/19/2024    RBC 4.23 (L) 04/19/2024    HGB 13.1 (L) 04/19/2024    HCT 38.6 (L) 04/19/2024    MCV 91.2 04/19/2024    MCH 31.1 04/19/2024    MCHC 34.1 04/19/2024    RDW 14.5 04/19/2024    PLT 249 04/19/2024    MPV 8.8 04/19/2024     Lab Results   Component Value Date    ANCAUTOPCNT 57.8 12/09/2022    ADLYMPCENT 30.4 12/09/2022    MONOPCNTAUT 9.0 12/09/2022    AUTOEOS 2.4 12/09/2022    ADBASOPCENT 0.4 12/09/2022    ANCAUTO 3.7 12/09/2022    ADLYMABS 2.0 12/09/2022    MONOABSAUT 0.6 12/09/2022    AUTOEOSABS 0.2 12/09/2022    ADBASOABS 0.0 12/09/2022    POLYPCNT 60.0 04/19/2024    LYMPHPCNT 27.0 04/19/2024    MONOPCNT 8.0 04/19/2024    EOSPCNT 4.0 04/19/2024    ANCMANUAL 4.5 04/19/2024    LYMPHABS 2.0 04/19/2024    MONOABS 0.6 (H) 04/19/2024    EOSABS 0.3 04/19/2024     Last Viral Hepatitis B Serologic Testing Result(s)  Lab Results   Component Value Date    HBSABQL Nonreactive 01/19/2021    HBSAG Nonreactive 01/19/2021    HEPBCOREABTO Nonreactive 01/19/2021     Last Immunoglobulin Testing Result(s)  IMMUNOGLOBULIN G   Date Value Ref Range Status   09/22/2022 1,268 700 - 1,600 mg/dL Final     IMMUNOGLOBULIN M   Date Value Ref Range Status   09/22/2022 54 40 -  230 mg/dL Final     IMMUNOGLOBULIN A   Date Value Ref Range Status   09/22/2022 300 70 - 400 mg/dL Final      Next Appointment:  Future Appointments   Date Time Provider Department Center   05/30/2024 12:30 PM Apperson, Rosaline CROME, MD [facility]     Assessment/Plan:   Labs reviewed. Stable. Patient to continue taking Kesimpta  (ofatumumab ) and Ampyra  (dalfampridine ) as prescribed.  Next labs due 09/2024. Will have patient complete CBC/CMP, Iggs, and Hep B serologies at that time for convenience.     Leeroy Garnet, PharmD, BCACP  Clinical Pharmacist, Neurology Specialty Pharmacy  [phone] option 9 then 4

## 2024-05-30 ENCOUNTER — Ambulatory Visit: Attending: Neurology | Admitting: Neurology

## 2024-05-30 VITALS — BP 100/69 | HR 60 | Temp 98.5°F | Resp 16 | Ht 73.5 in | Wt 210.0 lb

## 2024-05-30 DIAGNOSIS — M792 Neuralgia and neuritis, unspecified: Secondary | ICD-10-CM | POA: Insufficient documentation

## 2024-05-30 DIAGNOSIS — G35A Relapsing-remitting multiple sclerosis: Secondary | ICD-10-CM | POA: Insufficient documentation
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
## 2024-05-30 NOTE — Nursing Note (Signed)
 The pt was identified by using two patient identifiers: Name & DOB. Vital signs measured, drug allergies reviewed, and preferred pharmacy selected. Carolanne Kelly MA II    Vitals:    05/30/24 1244   BP: 100/69   SITE: left arm   Orthostatic Position: lying   Cuff Size: large   Pulse: 60   Resp: 16   Temp: 36.9 C (98.5 F)   TempSrc: Tympanic   SpO2: 93%   Weight: 95.3 kg (210 lb)   Height: 1.867 m (6' 1.5)

## 2024-05-31 ENCOUNTER — Ambulatory Visit: Admitting: Neurology

## 2024-07-23 ENCOUNTER — Other Ambulatory Visit: Payer: Self-pay

## 2024-07-30 ENCOUNTER — Other Ambulatory Visit: Payer: Self-pay | Admitting: Neurology

## 2024-07-30 ENCOUNTER — Other Ambulatory Visit: Payer: Self-pay

## 2024-07-30 DIAGNOSIS — Z5181 Encounter for therapeutic drug level monitoring: Secondary | ICD-10-CM

## 2024-07-30 DIAGNOSIS — G35D Multiple sclerosis, unspecified: Secondary | ICD-10-CM

## 2024-07-30 MED ORDER — KESIMPTA PEN 20 MG/0.4 ML SUBCUTANEOUS PEN INJECTOR
20.0000 mg | PEN_INJECTOR | SUBCUTANEOUS | 3 refills | Status: AC
  Filled 2024-07-31: qty 1.2, 84d supply, fill #0

## 2024-07-30 NOTE — Telephone Encounter (Signed)
 Specialty Pharmacy Prescription Renewal Encounter:    Received prescription renewal request for Kesimpta  (ofatumumab ), Kesimpta  Maintenance Dose (20 mg/0.4 mL pen): Inject 1 pen subcutaneously every 4 weeks (#1.2 mL or 3 pens, 3RF)    Last fill date: 04/22/2024 (#1.58ml/ 84ds)     Last clinic visit: 05/30/2024 Jules, Rosaline CROME, MD )     - Continue Kesimpta  injections as scheduled.     - Continue Ampyra  10 mg bid for weakness in the arms and legs     Next upcoming clinic visit: 11/26/2024 Jules, Rosaline CROME, MD )     Pertinent monitoring:     Last Complete Blood Count (CBC) without Differential Result(s)  Lab Results   Component Value Date    WBC 7.3 04/19/2024    RBC 4.23 (L) 04/19/2024    HGB 13.1 (L) 04/19/2024    HCT 38.6 (L) 04/19/2024    MCV 91.2 04/19/2024    MCH 31.1 04/19/2024    MCHC 34.1 04/19/2024    RDW 14.5 04/19/2024    PLT 249 04/19/2024    MPV 8.8 04/19/2024      Last Chemistry Panel Result(s)     BUN [8-25 mg/dL] 17 mg/dL  (1/80/74 5:80 AM)      Lab Results   Component Value Date    NA 139 04/19/2024    K 4.1 04/19/2024    CL 101 04/19/2024    CO2 24 04/19/2024    BUN 4 (L) 04/19/2024    CR 0.55 04/19/2024    GLU 100 04/19/2024    CA 8.7 04/19/2024    TP 6.9 12/09/2022    ALB 3.8 (L) 12/09/2022    ALP 70 12/09/2022    AST 13 12/09/2022    TBIL 0.2 12/09/2022    ALT 13 12/09/2022    GFR >100 04/19/2024   Last Viral Hepatitis B Serologic Testing Result(s)      Lab Results   Component Value Date    HBSABQL Nonreactive 01/19/2021    HBSAG Nonreactive 01/19/2021    HEPBCOREABTO Nonreactive 01/19/2021   Last Immunoglobulin Testing Result(s)  IMMUNOGLOBULIN G   Date Value Ref Range Status   09/22/2022 1,268 700 - 1,600 mg/dL Final     IMMUNOGLOBULIN M   Date Value Ref Range Status   09/22/2022 54 40 - 230 mg/dL Final     IMMUNOGLOBULIN A   Date Value Ref Range Status   09/22/2022 300 70 - 400 mg/dL Final         BUN has been stable until the most recent value, which is low.     There are no dosage  adjustments for Kesimpta  SC provided in the manufacturers labeling for altered kidney function.    Renewal can be authorized at this time. Will order Hep B serology and immunoglobulins (IGs) as routine monitoring.         Refill authorized on 07/30/2024 per Specialty Pharmacy CPA     Further renewals to be provided, as appropriate.     Samai Corea, PharmD  Clinical Specialty Pharmacist  [phone], option 9, then 4

## 2024-07-30 NOTE — Addendum Note (Signed)
 Addended by: Shawneequa Baldridge on: 07/30/2024 04:07 PM     Modules accepted: Orders

## 2024-07-31 ENCOUNTER — Other Ambulatory Visit: Payer: Self-pay

## 2024-08-01 ENCOUNTER — Other Ambulatory Visit: Payer: Self-pay

## 2024-08-01 NOTE — Progress Notes (Signed)
 Reason for Telephone:  Medication Adherence Follow-up    Reviewed Home Medication List: Yes, no new changes in medications were noted per SNF Nurse    Med Adherence Screen (Number doses missed/Number tablets remaining): SNF Nurse notes that they have not missed any doses.    Adverse Drug Events/Toxicities: None     Telephone follow-up in 84 days, but patient is encouraged to contact pharmacist sooner if any issue arises    Additional Notes: Medication to be delivered on 08/01/24

## 2024-08-01 NOTE — Telephone Encounter (Signed)
 Patient identifiers confirmed with patient: Name, DOB, Phone number, address.     Patient called upset and states he hasn't received his refill for Kesimpta . Patient states he is out and usually is informed a month about his refill and this time he had no feedback. Please kindly follow up      Oleh Sanders [facility] II  [facility] Neurology  Appt line [phone]  Clinic Fax # [phone]

## 2024-08-01 NOTE — Progress Notes (Signed)
 Specialty Pharmacy Telephone Encounter: Medication Fill Coordination      Roy Henry  [id number]     Called patient to coordinate medication(s) to be filled at [facility]:     Kesimpta   Pen 20 mg / 0.4 ml # 0.4 ml     Telephone follow-up will be scheduled prior to the next refill due date, but patient is encouraged to contact pharmacy sooner if any issues arise.      Schedule Ship/Pick Up Date:             1/15    Contact Person:                                  Patient  Number of Supplies Left in Days:       14     Credit Card on File:                             N/A- $0 Copay        Initials &  Program Carrier Method Signature Option    MP Neuro   FedEx Overnight  No Signature Required

## 2024-10-10 ENCOUNTER — Ambulatory Visit: Admitting: Neurology

## 2024-11-26 ENCOUNTER — Ambulatory Visit: Admitting: Neurology
